# Patient Record
Sex: Male | Born: 1940
Health system: Southern US, Community
[De-identification: ages and names within clinical notes are randomized; demographics above are authoritative.]

## PROBLEM LIST (undated history)

## (undated) DIAGNOSIS — E8881 Metabolic syndrome: Secondary | ICD-10-CM

## (undated) DIAGNOSIS — R399 Unspecified symptoms and signs involving the genitourinary system: Secondary | ICD-10-CM

## (undated) DIAGNOSIS — M8580 Other specified disorders of bone density and structure, unspecified site: Secondary | ICD-10-CM

## (undated) DIAGNOSIS — H3530 Unspecified macular degeneration: Secondary | ICD-10-CM

## (undated) DIAGNOSIS — I7121 Aneurysm of the ascending aorta, without rupture: Secondary | ICD-10-CM

## (undated) DIAGNOSIS — I499 Cardiac arrhythmia, unspecified: Secondary | ICD-10-CM

## (undated) DIAGNOSIS — E785 Hyperlipidemia, unspecified: Secondary | ICD-10-CM

## (undated) DIAGNOSIS — I714 Abdominal aortic aneurysm, without rupture, unspecified: Secondary | ICD-10-CM

## (undated) DIAGNOSIS — I712 Thoracic aortic aneurysm, without rupture: Secondary | ICD-10-CM

## (undated) DIAGNOSIS — I219 Acute myocardial infarction, unspecified: Secondary | ICD-10-CM

## (undated) DIAGNOSIS — I739 Peripheral vascular disease, unspecified: Secondary | ICD-10-CM

## (undated) DIAGNOSIS — N183 Chronic kidney disease, stage 3 unspecified: Secondary | ICD-10-CM

## (undated) DIAGNOSIS — R3912 Poor urinary stream: Secondary | ICD-10-CM

## (undated) DIAGNOSIS — Q631 Lobulated, fused and horseshoe kidney: Secondary | ICD-10-CM

## (undated) DIAGNOSIS — C801 Malignant (primary) neoplasm, unspecified: Secondary | ICD-10-CM

## (undated) DIAGNOSIS — J439 Emphysema, unspecified: Secondary | ICD-10-CM

## (undated) DIAGNOSIS — I4821 Permanent atrial fibrillation: Secondary | ICD-10-CM

## (undated) DIAGNOSIS — E119 Type 2 diabetes mellitus without complications: Secondary | ICD-10-CM

## (undated) DIAGNOSIS — H43813 Vitreous degeneration, bilateral: Secondary | ICD-10-CM

## (undated) DIAGNOSIS — R7303 Prediabetes: Secondary | ICD-10-CM

## (undated) DIAGNOSIS — I10 Essential (primary) hypertension: Secondary | ICD-10-CM

## (undated) DIAGNOSIS — Z9289 Personal history of other medical treatment: Secondary | ICD-10-CM

## (undated) DIAGNOSIS — Z7901 Long term (current) use of anticoagulants: Secondary | ICD-10-CM

## (undated) DIAGNOSIS — E039 Hypothyroidism, unspecified: Secondary | ICD-10-CM

## (undated) DIAGNOSIS — E782 Mixed hyperlipidemia: Secondary | ICD-10-CM

## (undated) DIAGNOSIS — G629 Polyneuropathy, unspecified: Secondary | ICD-10-CM

## (undated) DIAGNOSIS — T8859XA Other complications of anesthesia, initial encounter: Secondary | ICD-10-CM

## (undated) DIAGNOSIS — I2510 Atherosclerotic heart disease of native coronary artery without angina pectoris: Secondary | ICD-10-CM

## (undated) HISTORY — DX: Metabolic syndrome: E88.810

## (undated) HISTORY — DX: Acute myocardial infarction, unspecified: I21.9

## (undated) HISTORY — DX: Aneurysm of the ascending aorta, without rupture: I71.21

## (undated) HISTORY — DX: Hypothyroidism, unspecified: E03.9

## (undated) HISTORY — DX: Thoracic aortic aneurysm, without rupture: I71.2

## (undated) HISTORY — DX: Abdominal aortic aneurysm, without rupture: I71.4

## (undated) HISTORY — DX: Hyperlipidemia, unspecified: E78.5

## (undated) HISTORY — DX: Peripheral vascular disease, unspecified: I73.9

## (undated) HISTORY — DX: Type 2 diabetes mellitus without complications: E11.9

## (undated) HISTORY — PX: COLONOSCOPY: SHX174

## (undated) HISTORY — DX: Chronic kidney disease, stage 3 (moderate): N18.3

## (undated) HISTORY — DX: Polyneuropathy, unspecified: G62.9

## (undated) HISTORY — DX: Abdominal aortic aneurysm, without rupture, unspecified: I71.40

## (undated) HISTORY — DX: Unspecified macular degeneration: H35.30

## (undated) HISTORY — DX: Metabolic syndrome: E88.81

## (undated) HISTORY — PX: CORONARY STENT PLACEMENT: SHX1402

## (undated) HISTORY — DX: Chronic kidney disease, stage 3 unspecified: N18.30

## (undated) HISTORY — DX: Personal history of other medical treatment: Z92.89

---

## 1998-08-30 DIAGNOSIS — I2510 Atherosclerotic heart disease of native coronary artery without angina pectoris: Secondary | ICD-10-CM

## 1998-08-30 DIAGNOSIS — Z955 Presence of coronary angioplasty implant and graft: Secondary | ICD-10-CM

## 1998-08-30 DIAGNOSIS — I252 Old myocardial infarction: Secondary | ICD-10-CM

## 1998-08-30 HISTORY — DX: Old myocardial infarction: I25.2

## 1998-08-30 HISTORY — DX: Presence of coronary angioplasty implant and graft: Z95.5

## 1998-08-30 HISTORY — DX: Atherosclerotic heart disease of native coronary artery without angina pectoris: I25.10

## 1998-08-30 HISTORY — PX: CARDIAC CATHETERIZATION: SHX172

## 1998-08-30 HISTORY — PX: CORONARY ANGIOPLASTY WITH STENT PLACEMENT: SHX49

## 2005-05-07 ENCOUNTER — Other Ambulatory Visit: Payer: Self-pay

## 2005-05-07 ENCOUNTER — Emergency Department: Payer: Self-pay | Admitting: General Practice

## 2008-11-23 ENCOUNTER — Inpatient Hospital Stay: Payer: Self-pay | Admitting: Internal Medicine

## 2008-12-12 ENCOUNTER — Encounter: Payer: Self-pay | Admitting: Gastroenterology

## 2009-01-23 ENCOUNTER — Ambulatory Visit: Payer: Self-pay | Admitting: Gastroenterology

## 2009-01-23 ENCOUNTER — Encounter: Payer: Self-pay | Admitting: Gastroenterology

## 2009-02-18 ENCOUNTER — Telehealth (INDEPENDENT_AMBULATORY_CARE_PROVIDER_SITE_OTHER): Payer: Self-pay | Admitting: *Deleted

## 2009-02-18 ENCOUNTER — Encounter: Payer: Self-pay | Admitting: Gastroenterology

## 2009-02-18 DIAGNOSIS — R933 Abnormal findings on diagnostic imaging of other parts of digestive tract: Secondary | ICD-10-CM | POA: Insufficient documentation

## 2009-03-05 ENCOUNTER — Telehealth (INDEPENDENT_AMBULATORY_CARE_PROVIDER_SITE_OTHER): Payer: Self-pay | Admitting: *Deleted

## 2009-03-06 ENCOUNTER — Ambulatory Visit: Payer: Self-pay | Admitting: Gastroenterology

## 2009-03-06 ENCOUNTER — Encounter: Payer: Self-pay | Admitting: Gastroenterology

## 2009-03-06 ENCOUNTER — Ambulatory Visit (HOSPITAL_COMMUNITY): Admission: RE | Admit: 2009-03-06 | Discharge: 2009-03-06 | Payer: Self-pay | Admitting: Gastroenterology

## 2010-01-17 ENCOUNTER — Emergency Department: Payer: Self-pay | Admitting: Unknown Physician Specialty

## 2010-02-09 ENCOUNTER — Encounter: Payer: Self-pay | Admitting: Gastroenterology

## 2010-02-18 ENCOUNTER — Encounter (INDEPENDENT_AMBULATORY_CARE_PROVIDER_SITE_OTHER): Payer: Self-pay | Admitting: *Deleted

## 2010-02-18 ENCOUNTER — Telehealth (INDEPENDENT_AMBULATORY_CARE_PROVIDER_SITE_OTHER): Payer: Self-pay | Admitting: *Deleted

## 2010-03-05 ENCOUNTER — Ambulatory Visit (HOSPITAL_COMMUNITY): Admission: RE | Admit: 2010-03-05 | Discharge: 2010-03-05 | Payer: Self-pay | Admitting: Gastroenterology

## 2010-03-05 ENCOUNTER — Ambulatory Visit: Payer: Self-pay | Admitting: Gastroenterology

## 2010-09-29 NOTE — Procedures (Signed)
Summary: Endoscopic Ultrasound  Patient: Thomas Fuentes Note: All result statuses are Final unless otherwise noted.  Tests: (1) Endoscopic Ultrasound (EUS)  EUS Endoscopic Ultrasound                             DONE     [facility]     [address] [place], [place]  [address]           ENDOSCOPIC ULTRASOUND PROCEDURE REPORT           PATIENT:  Thomas Fuentes, Thomas Fuentes  MR#:  [id number]     BIRTHDATE:  Oct 06, 1940  GENDER:  male     ENDOSCOPIST:  Rachael Fee, MD     PROCEDURE DATE:  03/05/2010     PROCEDURE:  Upper EUS     ASA CLASS:  Class II     INDICATIONS:  surveillance of 6.89mm duodenal subepithelial lesion     noted incidentally by Dr. 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 The patient was then placed in the left, lateral,     decubitus postion and IV sedation was administered. Throughout the     procedure, the patient's blood pressure, pulse and oxygen     saturations were monitored continuously.  Under direct     visualization, the  endoscope was introduced through the  and     advanced to the .  Water was used as necessary to provide an     acoustic interface.  Upon completion of the imaging, water was     removed and the patient was sent to the recovery room in     satisfactory condition.     <<PROCEDUREIMAGES>>           Endoscopic findings (limited views with radial echoendoscope):     1. Normal esophagus     2. Normal stomach     3. Small, round (<1cm) subepithelial bulge in duodenal bulb.           EUS findings:     1. The subepithelial lesion described above corresponded with a     hypoechoic, round, well demarcated lesion that communicates with     muscularis propria layer of duodenal bulb wall.  The lesion     measured 7.52mm maximally.     2. Normal gallbladder.     3. Limited views of liver, spleen, pancreas were all normal.           Impression:     The duodenal bulb subepithelial lesion has not signficantly grown,     changed in the past 12 months. It is causing no symptoms and I     think it is reasonable to simply follow him clinically at this     point without dedicated surveillance endoscopy.           ______________________________     Rachael Fee, MD           cc: Barnetta Chapel, MD           n.     Rosalie Doctor:   Reuel Boom  Marye Round at 03/05/2010 11:25 AM           Dupre, Peyton Najjar, [id number]  Note: An exclamation mark (!) indicates a result that was not dispersed into the flowsheet. Document Creation Date: 03/05/2010 11:25 AM _______________________________________________________________________  (1) Order result status: Final Collection or observation date-time: 03/05/2010 11:12 Requested date-time:  Receipt date-time:  Reported date-time:  Referring Physician:   Ordering Physician: Rob Bunting [id number]) Specimen Source:  Source: Launa Grill Order Number: [id number] Lab site:   Appended Document: Endoscopic Ultrasound patty, can you forward this to Dr. Barnetta Chapel, I am having trouble with [employer] system at hospital today.  Appended Document: Endoscopic Ultrasound faxed to [facility]

## 2010-09-29 NOTE — Letter (Signed)
Summary: EGD Instructions  [facility]  [address] [place], [place] [address]   Phone: [phone]  Fax: [phone]       [name]    09/05/1940    MRN: [id number]       Procedure Day /Date:03/05/10 Thomas Fuentes     Arrival Time:10 am       Procedure Time:11 am     Location of Procedure:                     X [facility] ( Outpatient Registration)    PREPARATION FOR ENDOSCOPY   On 03/05/10  THE DAY OF THE PROCEDURE:  1.   No solid foods, milk or milk products are allowed after midnight the night before your procedure.  2.   Do not drink anything colored red or purple.  Avoid juices with pulp.  No orange juice.  3.  You may drink clear liquids until 7 am, which is 4 hours before your procedure.                                                                                                CLEAR LIQUIDS INCLUDE: Water Jello Ice Popsicles Tea (sugar ok, no milk/cream) Powdered fruit flavored drinks Coffee (sugar ok, no milk/cream) Gatorade Juice: apple, white grape, white cranberry  Lemonade Clear bullion, consomm, broth Carbonated beverages (any kind) Strained chicken noodle soup Hard Candy   MEDICATION INSTRUCTIONS  Unless otherwise instructed, you should take regular prescription medications with a small sip of water as early as possible the morning of your procedure.               OTHER INSTRUCTIONS  You will need a responsible adult at least 72 years of age to accompany you and drive you home.   This person must remain in the waiting room during your procedure.  Wear loose fitting clothing that is easily removed.  Leave jewelry and other valuables at home.  However, you may wish to bring a book to read or an iPod/MP3 player to listen to music as you wait for your procedure to start.  Remove all body piercing jewelry and leave at home.  Total time from sign-in until discharge is approximately 2-3 hours.  You should go home  directly after your procedure and rest.  You can resume normal activities the day after your procedure.  The day of your procedure you should not:   Drive   Make legal decisions   Operate machinery   Drink alcohol   Return to work  You will receive specific instructions about eating, activities and medications before you leave.    The above instructions have been reviewed and explained to me by   Chales Abrahams CMA Duncan Dull)  February 18, 2010 3:44 PM     I fully understand and can verbalize these instructions over the phone and mailed to home Date 02/18/10

## 2010-09-29 NOTE — Procedures (Signed)
Summary: Recall Assessment/[facility]  Recall Assessment/[facility]   Imported By: Sherian Rein 02/20/2010 09:46:01  _____________________________________________________________________  External Attachment:    Type:   Image     Comment:   External Document

## 2010-09-29 NOTE — Progress Notes (Signed)
Summary: EUS  Phone Note Outgoing Call Call back at [facility] Phone [phone]   Call placed by: Chales Abrahams CMA Duncan Dull),  February 18, 2010 10:25 AM Summary of Call: pt needs to be scheduled for EUS  radial linear for Duodenal lesion f/u  left message on machine to call back  Initial call taken by: Chales Abrahams CMA Duncan Dull),  February 18, 2010 10:26 AM  Follow-up for Phone Call        pt returned call and is available any Thursday, his meds were reviewed.  No diabetes and no blood thinners.  I will call him back when it is scheduled Follow-up by: Chales Abrahams CMA Duncan Dull),  February 18, 2010 3:36 PM  Additional Follow-up for Phone Call Additional follow up Details #1::        pt scheduled and instructed Additional Follow-up by: Chales Abrahams CMA Duncan Dull),  February 18, 2010 3:53 PM

## 2011-09-07 DIAGNOSIS — Z87891 Personal history of nicotine dependence: Secondary | ICD-10-CM | POA: Diagnosis not present

## 2011-09-16 DIAGNOSIS — D485 Neoplasm of uncertain behavior of skin: Secondary | ICD-10-CM | POA: Diagnosis not present

## 2011-09-16 DIAGNOSIS — C4441 Basal cell carcinoma of skin of scalp and neck: Secondary | ICD-10-CM | POA: Diagnosis not present

## 2011-09-16 DIAGNOSIS — L570 Actinic keratosis: Secondary | ICD-10-CM | POA: Diagnosis not present

## 2011-11-10 DIAGNOSIS — C4441 Basal cell carcinoma of skin of scalp and neck: Secondary | ICD-10-CM | POA: Diagnosis not present

## 2011-11-30 DIAGNOSIS — E785 Hyperlipidemia, unspecified: Secondary | ICD-10-CM | POA: Diagnosis not present

## 2012-02-04 DIAGNOSIS — G609 Hereditary and idiopathic neuropathy, unspecified: Secondary | ICD-10-CM | POA: Diagnosis not present

## 2012-02-04 DIAGNOSIS — E8881 Metabolic syndrome: Secondary | ICD-10-CM | POA: Diagnosis not present

## 2012-02-04 DIAGNOSIS — R7309 Other abnormal glucose: Secondary | ICD-10-CM | POA: Diagnosis not present

## 2012-02-04 DIAGNOSIS — I259 Chronic ischemic heart disease, unspecified: Secondary | ICD-10-CM | POA: Diagnosis not present

## 2012-02-04 DIAGNOSIS — I10 Essential (primary) hypertension: Secondary | ICD-10-CM | POA: Diagnosis not present

## 2012-02-04 DIAGNOSIS — E785 Hyperlipidemia, unspecified: Secondary | ICD-10-CM | POA: Diagnosis not present

## 2012-02-04 DIAGNOSIS — I252 Old myocardial infarction: Secondary | ICD-10-CM | POA: Diagnosis not present

## 2012-02-21 DIAGNOSIS — R509 Fever, unspecified: Secondary | ICD-10-CM | POA: Diagnosis not present

## 2012-02-21 DIAGNOSIS — R82998 Other abnormal findings in urine: Secondary | ICD-10-CM | POA: Diagnosis not present

## 2012-02-21 DIAGNOSIS — N3000 Acute cystitis without hematuria: Secondary | ICD-10-CM | POA: Diagnosis not present

## 2012-02-21 DIAGNOSIS — I10 Essential (primary) hypertension: Secondary | ICD-10-CM | POA: Diagnosis not present

## 2012-02-21 DIAGNOSIS — R300 Dysuria: Secondary | ICD-10-CM | POA: Diagnosis not present

## 2012-02-25 DIAGNOSIS — L819 Disorder of pigmentation, unspecified: Secondary | ICD-10-CM | POA: Diagnosis not present

## 2012-02-25 DIAGNOSIS — D1801 Hemangioma of skin and subcutaneous tissue: Secondary | ICD-10-CM | POA: Diagnosis not present

## 2012-02-25 DIAGNOSIS — D239 Other benign neoplasm of skin, unspecified: Secondary | ICD-10-CM | POA: Diagnosis not present

## 2012-02-25 DIAGNOSIS — L821 Other seborrheic keratosis: Secondary | ICD-10-CM | POA: Diagnosis not present

## 2012-02-25 DIAGNOSIS — L570 Actinic keratosis: Secondary | ICD-10-CM | POA: Diagnosis not present

## 2012-02-25 DIAGNOSIS — Z85828 Personal history of other malignant neoplasm of skin: Secondary | ICD-10-CM | POA: Diagnosis not present

## 2012-03-13 DIAGNOSIS — E039 Hypothyroidism, unspecified: Secondary | ICD-10-CM | POA: Diagnosis not present

## 2012-03-13 DIAGNOSIS — R300 Dysuria: Secondary | ICD-10-CM | POA: Diagnosis not present

## 2012-03-13 DIAGNOSIS — E785 Hyperlipidemia, unspecified: Secondary | ICD-10-CM | POA: Diagnosis not present

## 2012-06-08 DIAGNOSIS — R7309 Other abnormal glucose: Secondary | ICD-10-CM | POA: Diagnosis not present

## 2012-06-08 DIAGNOSIS — E785 Hyperlipidemia, unspecified: Secondary | ICD-10-CM | POA: Diagnosis not present

## 2012-07-24 DIAGNOSIS — K219 Gastro-esophageal reflux disease without esophagitis: Secondary | ICD-10-CM | POA: Diagnosis not present

## 2012-08-03 DIAGNOSIS — I252 Old myocardial infarction: Secondary | ICD-10-CM | POA: Diagnosis not present

## 2012-08-03 DIAGNOSIS — I259 Chronic ischemic heart disease, unspecified: Secondary | ICD-10-CM | POA: Diagnosis not present

## 2012-08-03 DIAGNOSIS — Z125 Encounter for screening for malignant neoplasm of prostate: Secondary | ICD-10-CM | POA: Diagnosis not present

## 2012-08-03 DIAGNOSIS — E785 Hyperlipidemia, unspecified: Secondary | ICD-10-CM | POA: Diagnosis not present

## 2012-08-03 DIAGNOSIS — I10 Essential (primary) hypertension: Secondary | ICD-10-CM | POA: Diagnosis not present

## 2012-08-03 DIAGNOSIS — R7309 Other abnormal glucose: Secondary | ICD-10-CM | POA: Diagnosis not present

## 2012-08-03 DIAGNOSIS — E039 Hypothyroidism, unspecified: Secondary | ICD-10-CM | POA: Diagnosis not present

## 2012-08-03 DIAGNOSIS — I714 Abdominal aortic aneurysm, without rupture: Secondary | ICD-10-CM | POA: Diagnosis not present

## 2012-08-10 DIAGNOSIS — Z23 Encounter for immunization: Secondary | ICD-10-CM | POA: Diagnosis not present

## 2012-08-10 DIAGNOSIS — E785 Hyperlipidemia, unspecified: Secondary | ICD-10-CM | POA: Diagnosis not present

## 2012-08-10 DIAGNOSIS — R7309 Other abnormal glucose: Secondary | ICD-10-CM | POA: Diagnosis not present

## 2012-08-10 DIAGNOSIS — Z125 Encounter for screening for malignant neoplasm of prostate: Secondary | ICD-10-CM | POA: Diagnosis not present

## 2012-08-10 DIAGNOSIS — I10 Essential (primary) hypertension: Secondary | ICD-10-CM | POA: Diagnosis not present

## 2012-08-10 DIAGNOSIS — Z0000 Encounter for general adult medical examination without abnormal findings: Secondary | ICD-10-CM | POA: Diagnosis not present

## 2012-08-11 DIAGNOSIS — Z1212 Encounter for screening for malignant neoplasm of rectum: Secondary | ICD-10-CM | POA: Diagnosis not present

## 2012-09-05 DIAGNOSIS — K921 Melena: Secondary | ICD-10-CM | POA: Diagnosis not present

## 2012-10-31 DIAGNOSIS — I739 Peripheral vascular disease, unspecified: Secondary | ICD-10-CM | POA: Diagnosis not present

## 2012-10-31 DIAGNOSIS — I714 Abdominal aortic aneurysm, without rupture: Secondary | ICD-10-CM | POA: Diagnosis not present

## 2013-02-07 DIAGNOSIS — H353290 Exudative age-related macular degeneration, unspecified eye, stage unspecified: Secondary | ICD-10-CM | POA: Diagnosis not present

## 2013-02-07 DIAGNOSIS — H353190 Nonexudative age-related macular degeneration, unspecified eye, stage unspecified: Secondary | ICD-10-CM | POA: Diagnosis not present

## 2013-02-12 DIAGNOSIS — E785 Hyperlipidemia, unspecified: Secondary | ICD-10-CM | POA: Diagnosis not present

## 2013-02-12 DIAGNOSIS — R7309 Other abnormal glucose: Secondary | ICD-10-CM | POA: Diagnosis not present

## 2013-02-12 DIAGNOSIS — I714 Abdominal aortic aneurysm, without rupture: Secondary | ICD-10-CM | POA: Diagnosis not present

## 2013-02-12 DIAGNOSIS — I259 Chronic ischemic heart disease, unspecified: Secondary | ICD-10-CM | POA: Diagnosis not present

## 2013-02-12 DIAGNOSIS — I252 Old myocardial infarction: Secondary | ICD-10-CM | POA: Diagnosis not present

## 2013-02-12 DIAGNOSIS — Z1331 Encounter for screening for depression: Secondary | ICD-10-CM | POA: Diagnosis not present

## 2013-02-12 DIAGNOSIS — G609 Hereditary and idiopathic neuropathy, unspecified: Secondary | ICD-10-CM | POA: Diagnosis not present

## 2013-02-12 DIAGNOSIS — I10 Essential (primary) hypertension: Secondary | ICD-10-CM | POA: Diagnosis not present

## 2013-03-01 DIAGNOSIS — H353290 Exudative age-related macular degeneration, unspecified eye, stage unspecified: Secondary | ICD-10-CM | POA: Diagnosis not present

## 2013-03-06 DIAGNOSIS — D239 Other benign neoplasm of skin, unspecified: Secondary | ICD-10-CM | POA: Diagnosis not present

## 2013-03-06 DIAGNOSIS — Z85828 Personal history of other malignant neoplasm of skin: Secondary | ICD-10-CM | POA: Diagnosis not present

## 2013-03-06 DIAGNOSIS — D1801 Hemangioma of skin and subcutaneous tissue: Secondary | ICD-10-CM | POA: Diagnosis not present

## 2013-03-06 DIAGNOSIS — L819 Disorder of pigmentation, unspecified: Secondary | ICD-10-CM | POA: Diagnosis not present

## 2013-03-06 DIAGNOSIS — L570 Actinic keratosis: Secondary | ICD-10-CM | POA: Diagnosis not present

## 2013-03-06 DIAGNOSIS — L821 Other seborrheic keratosis: Secondary | ICD-10-CM | POA: Diagnosis not present

## 2013-03-30 DIAGNOSIS — H353290 Exudative age-related macular degeneration, unspecified eye, stage unspecified: Secondary | ICD-10-CM | POA: Diagnosis not present

## 2013-05-04 DIAGNOSIS — H353290 Exudative age-related macular degeneration, unspecified eye, stage unspecified: Secondary | ICD-10-CM | POA: Diagnosis not present

## 2013-05-30 ENCOUNTER — Emergency Department: Payer: Self-pay | Admitting: Emergency Medicine

## 2013-05-30 DIAGNOSIS — Z95818 Presence of other cardiac implants and grafts: Secondary | ICD-10-CM | POA: Diagnosis not present

## 2013-05-30 DIAGNOSIS — Z7982 Long term (current) use of aspirin: Secondary | ICD-10-CM | POA: Diagnosis not present

## 2013-05-30 DIAGNOSIS — R5381 Other malaise: Secondary | ICD-10-CM | POA: Diagnosis not present

## 2013-05-30 DIAGNOSIS — T6391XA Toxic effect of contact with unspecified venomous animal, accidental (unintentional), initial encounter: Secondary | ICD-10-CM | POA: Diagnosis not present

## 2013-05-30 DIAGNOSIS — S0100XA Unspecified open wound of scalp, initial encounter: Secondary | ICD-10-CM | POA: Diagnosis not present

## 2013-05-30 DIAGNOSIS — R0602 Shortness of breath: Secondary | ICD-10-CM | POA: Diagnosis not present

## 2013-05-30 DIAGNOSIS — S0990XA Unspecified injury of head, initial encounter: Secondary | ICD-10-CM | POA: Diagnosis not present

## 2013-05-30 DIAGNOSIS — R42 Dizziness and giddiness: Secondary | ICD-10-CM | POA: Diagnosis not present

## 2013-05-30 DIAGNOSIS — I252 Old myocardial infarction: Secondary | ICD-10-CM | POA: Diagnosis not present

## 2013-05-30 DIAGNOSIS — E785 Hyperlipidemia, unspecified: Secondary | ICD-10-CM | POA: Diagnosis not present

## 2013-05-30 DIAGNOSIS — Z91038 Other insect allergy status: Secondary | ICD-10-CM | POA: Diagnosis not present

## 2013-05-30 DIAGNOSIS — I10 Essential (primary) hypertension: Secondary | ICD-10-CM | POA: Diagnosis not present

## 2013-06-07 ENCOUNTER — Emergency Department: Payer: Self-pay | Admitting: Internal Medicine

## 2013-06-08 DIAGNOSIS — H353290 Exudative age-related macular degeneration, unspecified eye, stage unspecified: Secondary | ICD-10-CM | POA: Diagnosis not present

## 2013-07-15 ENCOUNTER — Other Ambulatory Visit: Payer: Self-pay

## 2013-07-15 ENCOUNTER — Encounter (HOSPITAL_COMMUNITY): Payer: Self-pay | Admitting: Emergency Medicine

## 2013-07-15 ENCOUNTER — Emergency Department (HOSPITAL_COMMUNITY)
Admission: EM | Admit: 2013-07-15 | Discharge: 2013-07-15 | Disposition: A | Discharge status: Home / Self Care | Payer: Medicare Other | Attending: Emergency Medicine | Admitting: Emergency Medicine

## 2013-07-15 ENCOUNTER — Emergency Department (INDEPENDENT_AMBULATORY_CARE_PROVIDER_SITE_OTHER)
Admission: EM | Admit: 2013-07-15 | Discharge: 2013-07-15 | Disposition: A | Discharge status: Other Acute Inpatient Hospital | Payer: Medicare Other | Source: Home / Self Care | Attending: Family Medicine | Admitting: Family Medicine

## 2013-07-15 ENCOUNTER — Emergency Department (HOSPITAL_COMMUNITY): Payer: Medicare Other

## 2013-07-15 DIAGNOSIS — R55 Syncope and collapse: Secondary | ICD-10-CM

## 2013-07-15 DIAGNOSIS — J111 Influenza due to unidentified influenza virus with other respiratory manifestations: Secondary | ICD-10-CM | POA: Insufficient documentation

## 2013-07-15 DIAGNOSIS — R42 Dizziness and giddiness: Secondary | ICD-10-CM | POA: Insufficient documentation

## 2013-07-15 DIAGNOSIS — Z87891 Personal history of nicotine dependence: Secondary | ICD-10-CM | POA: Insufficient documentation

## 2013-07-15 DIAGNOSIS — I2510 Atherosclerotic heart disease of native coronary artery without angina pectoris: Secondary | ICD-10-CM | POA: Diagnosis not present

## 2013-07-15 DIAGNOSIS — Z9861 Coronary angioplasty status: Secondary | ICD-10-CM | POA: Insufficient documentation

## 2013-07-15 DIAGNOSIS — J3489 Other specified disorders of nose and nasal sinuses: Secondary | ICD-10-CM | POA: Insufficient documentation

## 2013-07-15 DIAGNOSIS — I10 Essential (primary) hypertension: Secondary | ICD-10-CM | POA: Insufficient documentation

## 2013-07-15 DIAGNOSIS — R630 Anorexia: Secondary | ICD-10-CM | POA: Diagnosis not present

## 2013-07-15 DIAGNOSIS — R05 Cough: Secondary | ICD-10-CM | POA: Diagnosis not present

## 2013-07-15 HISTORY — DX: Atherosclerotic heart disease of native coronary artery without angina pectoris: I25.10

## 2013-07-15 HISTORY — DX: Essential (primary) hypertension: I10

## 2013-07-15 LAB — URINALYSIS, ROUTINE W REFLEX MICROSCOPIC
Bilirubin Urine: NEGATIVE
Glucose, UA: NEGATIVE mg/dL
Hgb urine dipstick: NEGATIVE
Ketones, ur: NEGATIVE mg/dL
Protein, ur: NEGATIVE mg/dL

## 2013-07-15 LAB — CBC WITH DIFFERENTIAL/PLATELET
Basophils Absolute: 0 10*3/uL (ref 0.0–0.1)
Basophils Relative: 0 % (ref 0–1)
Hemoglobin: 12.8 g/dL — ABNORMAL LOW (ref 13.0–17.0)
Lymphocytes Relative: 7 % — ABNORMAL LOW (ref 12–46)
MCHC: 33.3 g/dL (ref 30.0–36.0)
Monocytes Relative: 7 % (ref 3–12)
Neutro Abs: 14.3 10*3/uL — ABNORMAL HIGH (ref 1.7–7.7)
Neutrophils Relative %: 87 % — ABNORMAL HIGH (ref 43–77)
WBC: 16.5 10*3/uL — ABNORMAL HIGH (ref 4.0–10.5)

## 2013-07-15 LAB — COMPREHENSIVE METABOLIC PANEL
AST: 18 U/L (ref 0–37)
Albumin: 3.6 g/dL (ref 3.5–5.2)
Alkaline Phosphatase: 36 U/L — ABNORMAL LOW (ref 39–117)
BUN: 34 mg/dL — ABNORMAL HIGH (ref 6–23)
CO2: 23 mEq/L (ref 19–32)
Chloride: 103 mEq/L (ref 96–112)
Potassium: 4.6 mEq/L (ref 3.5–5.1)
Total Bilirubin: 0.4 mg/dL (ref 0.3–1.2)

## 2013-07-15 LAB — GLUCOSE, CAPILLARY: Glucose-Capillary: 97 mg/dL (ref 70–99)

## 2013-07-15 MED ORDER — OSELTAMIVIR PHOSPHATE 75 MG PO CAPS: 75.0000 mg | ORAL_CAPSULE | Freq: Two times a day (BID) | ORAL | Status: DC

## 2013-07-15 MED ORDER — SODIUM CHLORIDE 0.9 % IV SOLN
INTRAVENOUS | Status: DC
  Administered 2013-07-15: 15:00:00 via INTRAVENOUS

## 2013-07-15 MED ORDER — IBUPROFEN 400 MG PO TABS
400.0000 mg | ORAL_TABLET | Freq: Once | ORAL | Status: AC
  Administered 2013-07-15: 400 mg via ORAL
  Filled 2013-07-15: qty 1

## 2013-07-15 MED ORDER — ACETAMINOPHEN 325 MG PO TABS
650.0000 mg | ORAL_TABLET | Freq: Once | ORAL | Status: AC
Start: 2013-07-15 — End: 2013-07-15
  Administered 2013-07-15: 650 mg via ORAL
  Filled 2013-07-15: qty 2

## 2013-07-15 NOTE — ED Notes (Addendum)
EKG from [facility] given to Dr. Anitra Lauth

## 2013-07-15 NOTE — ED Notes (Signed)
Assisted patient in dressing in blue scrubs for trip home.

## 2013-07-15 NOTE — ED Notes (Addendum)
Went to Urgent care for weakness, fever, chills x 1 day. While there, he passed out while going to bathroom with diarrhea episode. Currently A&Ox4. Reports chills now; no other distress at this time.

## 2013-07-15 NOTE — ED Notes (Signed)
Patient transported to X-ray 

## 2013-07-15 NOTE — ED Notes (Signed)
Able to walk to restroom and back to room without dizziness. Gait steady. States he feels 100% better and is ready to go home.

## 2013-07-15 NOTE — ED Notes (Signed)
Patient asked for urine sample. 

## 2013-07-15 NOTE — ED Notes (Signed)
Meal tray ordered 

## 2013-07-15 NOTE — ED Provider Notes (Signed)
CSN: [id number]     Arrival date & time 07/15/13  1239 History   First MD Initiated Contact with Patient 07/15/13 1318     No chief complaint on file.  (Consider location/radiation/quality/duration/timing/severity/associated sxs/prior Treatment) Patient is a 72 y.o. male presenting with fever. The history is provided by the patient.  Fever Temp source:  Subjective Severity:  Moderate Onset quality:  Sudden Duration:  1 day Chronicity:  New Associated symptoms: chills   Associated symptoms comment:  Sudden syncope during transport to exam room., no pain, no n/v.   No past medical history on file. No past surgical history on file. No family history on file. History  Substance Use Topics  . Smoking status: Not on file  . Smokeless tobacco: Not on file  . Alcohol Use: Not on file    Review of Systems  Constitutional: Positive for fever and chills.  Respiratory: Negative.   Gastrointestinal: Negative.   Genitourinary: Negative.     Allergies  Review of patient's allergies indicates not on file.  Home Medications  No current outpatient prescriptions on file. There were no vitals taken for this visit. Physical Exam  Nursing note and vitals reviewed. Constitutional: He is oriented to person, place, and time. He appears well-developed and well-nourished.  Cardiovascular: Regular rhythm and intact distal pulses.   Pulmonary/Chest: Effort normal and breath sounds normal.  Abdominal: Soft. Bowel sounds are normal.  Musculoskeletal: Normal range of motion.  Neurological: He is alert and oriented to person, place, and time. No cranial nerve deficit.  Skin: Skin is warm and dry.    ED Course  Procedures (including critical care time) Labs Review Labs Reviewed - No data to display Imaging Review No results found.  EKG Interpretation     Ventricular Rate:    PR Interval:    QRS Duration:   QT Interval:    QTC Calculation:   R Axis:     Text Interpretation:               MDM  Sent b/o syncope with bowel incontinence, h/o cad with 2 stents. Onset of fever last eve.    Linna Hoff, MD 07/15/13 [phone]

## 2013-07-15 NOTE — ED Notes (Signed)
Patient returned from X-ray 

## 2013-07-15 NOTE — ED Provider Notes (Signed)
CSN: [id number]     Arrival date & time 07/15/13  1350 History   First MD Initiated Contact with Patient 07/15/13 1359     Chief Complaint  Patient presents with  . Loss of Consciousness   (Consider location/radiation/quality/duration/timing/severity/associated sxs/prior Treatment) Patient is a 72 y.o. male presenting with syncope and flu symptoms. The history is provided by the patient.  Loss of Consciousness Episode history:  Single Most recent episode:  Today Duration:  30 seconds Timing:  Constant Progression:  Resolved Chronicity:  New Context: standing up   Context comment:  Has been ill and then was wearing a mask at urgent care which made him feel like he was suffocating and then he stood up and felt dizzy and passed out Witnessed: yes   Relieved by:  Lying down Worsened by:  Nothing tried Ineffective treatments:  None tried Associated symptoms: dizziness and fever   Associated symptoms: no confusion, no difficulty breathing, no palpitations, no shortness of breath and no vomiting   Influenza Presenting symptoms: fever   Presenting symptoms: no shortness of breath and no vomiting   Severity:  Severe Onset quality:  Sudden Duration:  1 day Progression:  Worsening Chronicity:  New Relieved by:  OTC medications Worsened by:  Nothing tried Associated symptoms: chills, decreased appetite, nasal congestion and syncope   Associated symptoms: no ear pain and no neck stiffness   Risk factors: being elderly and heart disease   Risk factors: no diabetes problem, no immunocompromised state, no kidney disease and no liver disease   Risk factors comment:  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  had a flu shot this year   Past Medical History  Diagnosis Date  . Coronary artery disease   . Hypertension    Past Surgical History  Procedure Laterality Date  . Coronary stent placement     History reviewed. No pertinent family history. History  Substance Use  Topics  . Smoking status: Former Smoker    Types: Cigarettes    Quit date: 02/06/1999  . Smokeless tobacco: Not on file  . Alcohol Use: Yes     Comment: occasional    Review of Systems  Constitutional: Positive for fever, chills and decreased appetite.  HENT: Positive for congestion. Negative for ear pain.   Respiratory: Negative for shortness of breath.   Cardiovascular: Positive for syncope. Negative for palpitations.  Gastrointestinal: Negative for vomiting.  Musculoskeletal: Negative for neck stiffness.  Neurological: Positive for dizziness.  Psychiatric/Behavioral: Negative for confusion.  All other systems reviewed and are negative.    Allergies  Lidocaine  Home Medications  No current outpatient prescriptions on file. BP 115/66  Temp(Src) 102.6 F (39.2 C) (Oral)  Resp 24  Ht 5\' 8"  (1.727 m)  Wt 150 lb (68.04 kg)  BMI 22.81 kg/m2  SpO2 98% Physical Exam  Nursing note and vitals reviewed. Constitutional: He is oriented to person, place, and time. He appears well-developed and well-nourished. No distress.  HENT:  Head: Normocephalic and atraumatic.  Mouth/Throat: Oropharynx is clear and moist.  Eyes: Conjunctivae and EOM are normal. Pupils are equal, round, and reactive to light.  Neck: Normal range of motion. Neck supple.  Cardiovascular: Normal rate, regular rhythm and intact distal pulses.   No murmur heard. Pulmonary/Chest: Effort normal and breath sounds normal. No respiratory distress. He has no wheezes. He has no rales.  Abdominal: Soft. He exhibits no distension. There is no tenderness. There is no rebound and no guarding.  Musculoskeletal: Normal range of motion. He exhibits no edema and no tenderness.  Neurological: He is alert and oriented to person, place, and time.  Skin: Skin is warm and dry. No rash noted. No erythema.  Psychiatric: He has a normal mood and affect. His behavior is normal.    ED Course  Procedures (including critical care  time) Labs Review Labs Reviewed  CBC WITH DIFFERENTIAL - Abnormal; Notable for the following:    WBC 16.5 (*)    RBC 4.15 (*)    Hemoglobin 12.8 (*)    HCT 38.4 (*)    Neutrophils Relative % 87 (*)    Neutro Abs 14.3 (*)    Lymphocytes Relative 7 (*)    Monocytes Absolute 1.1 (*)    All other components within normal limits  COMPREHENSIVE METABOLIC PANEL - Abnormal; Notable for the following:    BUN 34 (*)    Creatinine, Ser 1.76 (*)    Alkaline Phosphatase 36 (*)    GFR calc non Af Amer 36 (*)    GFR calc Af Amer 42 (*)    All other components within normal limits  URINALYSIS, ROUTINE W REFLEX MICROSCOPIC - Abnormal; Notable for the following:    APPearance CLOUDY (*)    All other components within normal limits   Imaging Review Dg Chest 2 View  07/15/2013   CLINICAL DATA:  Syncope and cough, history of coronary artery disease  EXAM: CHEST  2 VIEW  COMPARISON:  None.  FINDINGS: The heart size and mediastinal contours are within normal limits. Both lungs are clear. The visualized skeletal structures are unremarkable. Lungs are hypoaerated with crowding of the bronchovascular markings.  IMPRESSION: No active cardiopulmonary disease.   Electronically Signed   By: Christiana Pellant M.D.   On: 07/15/2013 15:02    EKG Interpretation   None      Date: 07/15/2013  Rate: 86  Rhythm: normal sinus rhythm  QRS Axis: normal  Intervals: normal  ST/T Wave abnormalities: normal  Conduction Disutrbances: none  Narrative Interpretation: unremarkable      MDM   1. Influenza     Pt with symptoms consistent with influenza.  Normal exam here but is febrile.  No signs of breathing difficulty  No signs of strep pharyngitis, otitis or abnormal abdominal findings.  Pt was at urgent care and pt states since they made him wear a mask he started to get very hot and dizzy and felt that he was going to pass out and he stood up and then syncopized onto the floor.  No injury at this time and now  feels better.  BP now improved to 120. CXR, CBC, CMP, UA pending.  Will continue antipyretica and rest and fluids and return for any further problems.  5:53 PM Labs with leukocytosis but o/w wnl.  Pt's bp is improving and temp has resolved and pt feels much better.  He states his pressure is usually in the low 100's and 90's.  Will ambulate pt and make sure he feels ok.  7:13 PM Pt has had pressures in the 80's and 90's but he feels great.  Tolerating po's.  He is able to ambulate without difficulty and denies dizziness.  Will have pt f/u with PCP tomorrow or return for worsening sx.  No signs of sepsis at this time.  Pt given tamiflu.  Gwyneth Sprout, MD 07/15/13 [phone]

## 2013-07-15 NOTE — ED Notes (Signed)
Called service response to check on meal tray; they report it is on the way.

## 2013-07-15 NOTE — ED Notes (Signed)
Sitting on side of bed eating meal. Tolerating meal very well, all food almost gone. Reports feeling his normal self.

## 2013-07-18 DIAGNOSIS — N179 Acute kidney failure, unspecified: Secondary | ICD-10-CM | POA: Diagnosis not present

## 2013-07-18 DIAGNOSIS — B9789 Other viral agents as the cause of diseases classified elsewhere: Secondary | ICD-10-CM | POA: Diagnosis not present

## 2013-07-18 DIAGNOSIS — IMO0002 Reserved for concepts with insufficient information to code with codable children: Secondary | ICD-10-CM | POA: Diagnosis not present

## 2013-07-18 DIAGNOSIS — A088 Other specified intestinal infections: Secondary | ICD-10-CM | POA: Diagnosis not present

## 2013-07-31 DIAGNOSIS — H353290 Exudative age-related macular degeneration, unspecified eye, stage unspecified: Secondary | ICD-10-CM | POA: Diagnosis not present

## 2013-08-08 DIAGNOSIS — R7309 Other abnormal glucose: Secondary | ICD-10-CM | POA: Diagnosis not present

## 2013-08-08 DIAGNOSIS — Z125 Encounter for screening for malignant neoplasm of prostate: Secondary | ICD-10-CM | POA: Diagnosis not present

## 2013-08-08 DIAGNOSIS — E039 Hypothyroidism, unspecified: Secondary | ICD-10-CM | POA: Diagnosis not present

## 2013-08-08 DIAGNOSIS — E785 Hyperlipidemia, unspecified: Secondary | ICD-10-CM | POA: Diagnosis not present

## 2013-08-09 DIAGNOSIS — K219 Gastro-esophageal reflux disease without esophagitis: Secondary | ICD-10-CM | POA: Diagnosis not present

## 2013-08-15 ENCOUNTER — Other Ambulatory Visit: Payer: Self-pay | Admitting: Internal Medicine

## 2013-08-15 DIAGNOSIS — Z23 Encounter for immunization: Secondary | ICD-10-CM | POA: Diagnosis not present

## 2013-08-15 DIAGNOSIS — I259 Chronic ischemic heart disease, unspecified: Secondary | ICD-10-CM | POA: Diagnosis not present

## 2013-08-15 DIAGNOSIS — I10 Essential (primary) hypertension: Secondary | ICD-10-CM | POA: Diagnosis not present

## 2013-08-15 DIAGNOSIS — R7309 Other abnormal glucose: Secondary | ICD-10-CM | POA: Diagnosis not present

## 2013-08-15 DIAGNOSIS — Z0000 Encounter for general adult medical examination without abnormal findings: Secondary | ICD-10-CM | POA: Diagnosis not present

## 2013-08-15 DIAGNOSIS — I714 Abdominal aortic aneurysm, without rupture: Secondary | ICD-10-CM

## 2013-08-15 DIAGNOSIS — E039 Hypothyroidism, unspecified: Secondary | ICD-10-CM | POA: Diagnosis not present

## 2013-08-15 DIAGNOSIS — E8881 Metabolic syndrome: Secondary | ICD-10-CM | POA: Diagnosis not present

## 2013-08-15 DIAGNOSIS — E785 Hyperlipidemia, unspecified: Secondary | ICD-10-CM | POA: Diagnosis not present

## 2013-08-16 DIAGNOSIS — Z1212 Encounter for screening for malignant neoplasm of rectum: Secondary | ICD-10-CM | POA: Diagnosis not present

## 2013-09-03 ENCOUNTER — Ambulatory Visit
Admission: RE | Admit: 2013-09-03 | Discharge: 2013-09-03 | Disposition: A | Discharge status: Home / Self Care | Payer: Medicare Other | Source: Ambulatory Visit | Attending: Internal Medicine | Admitting: Internal Medicine

## 2013-09-03 DIAGNOSIS — I714 Abdominal aortic aneurysm, without rupture, unspecified: Secondary | ICD-10-CM

## 2013-09-04 DIAGNOSIS — H353290 Exudative age-related macular degeneration, unspecified eye, stage unspecified: Secondary | ICD-10-CM | POA: Diagnosis not present

## 2013-10-02 DIAGNOSIS — H353290 Exudative age-related macular degeneration, unspecified eye, stage unspecified: Secondary | ICD-10-CM | POA: Diagnosis not present

## 2013-10-02 DIAGNOSIS — H353190 Nonexudative age-related macular degeneration, unspecified eye, stage unspecified: Secondary | ICD-10-CM | POA: Diagnosis not present

## 2013-10-30 DIAGNOSIS — H353290 Exudative age-related macular degeneration, unspecified eye, stage unspecified: Secondary | ICD-10-CM | POA: Diagnosis not present

## 2013-10-30 DIAGNOSIS — H353190 Nonexudative age-related macular degeneration, unspecified eye, stage unspecified: Secondary | ICD-10-CM | POA: Diagnosis not present

## 2013-11-22 IMAGING — CR DG CHEST 2V
2 series · 2 of 2 positions shown · non-contrast
Comparison: None.

CLINICAL DATA: Syncope and cough, history of coronary artery
disease

EXAM:
CHEST  2 VIEW

[w chest pa]
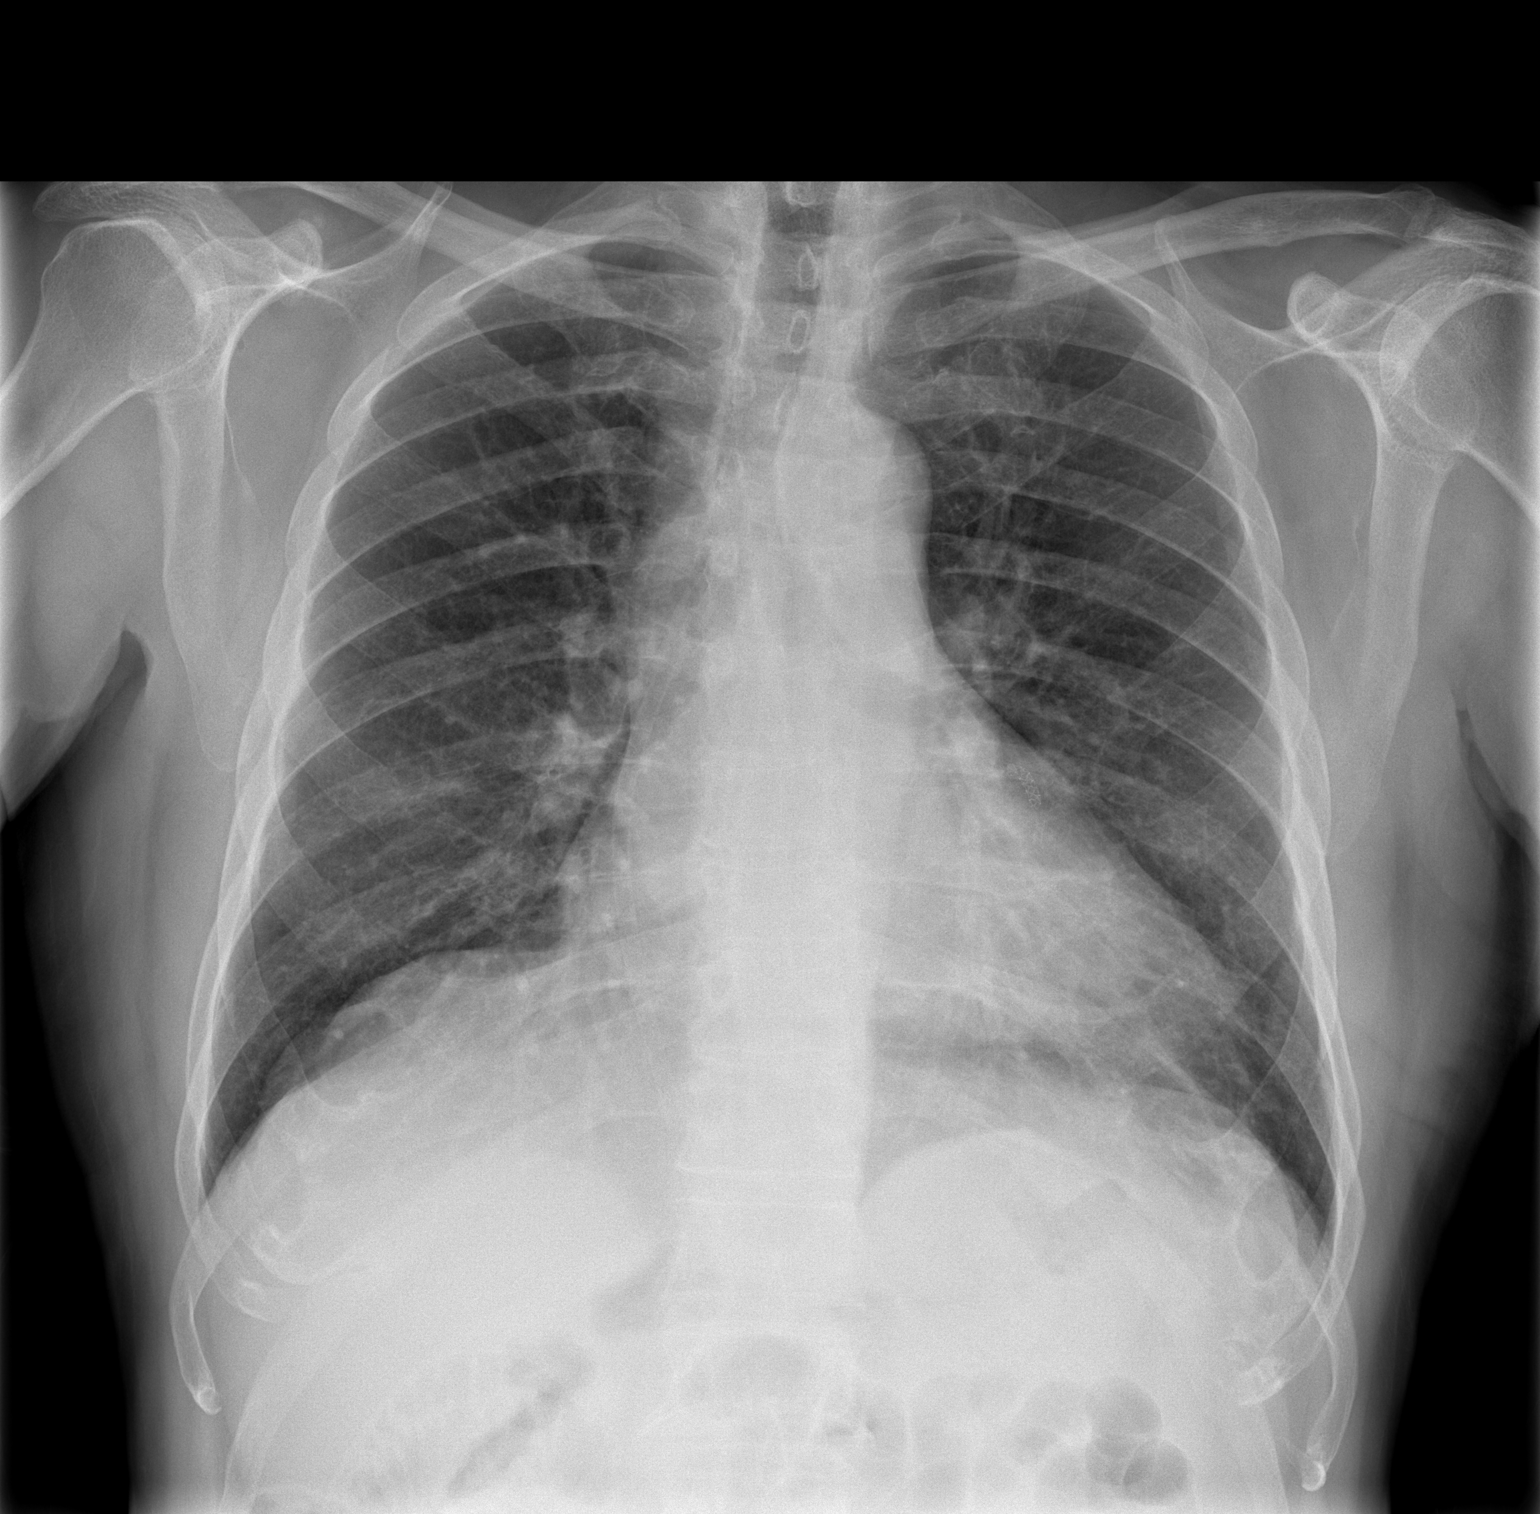

[w chest lat]
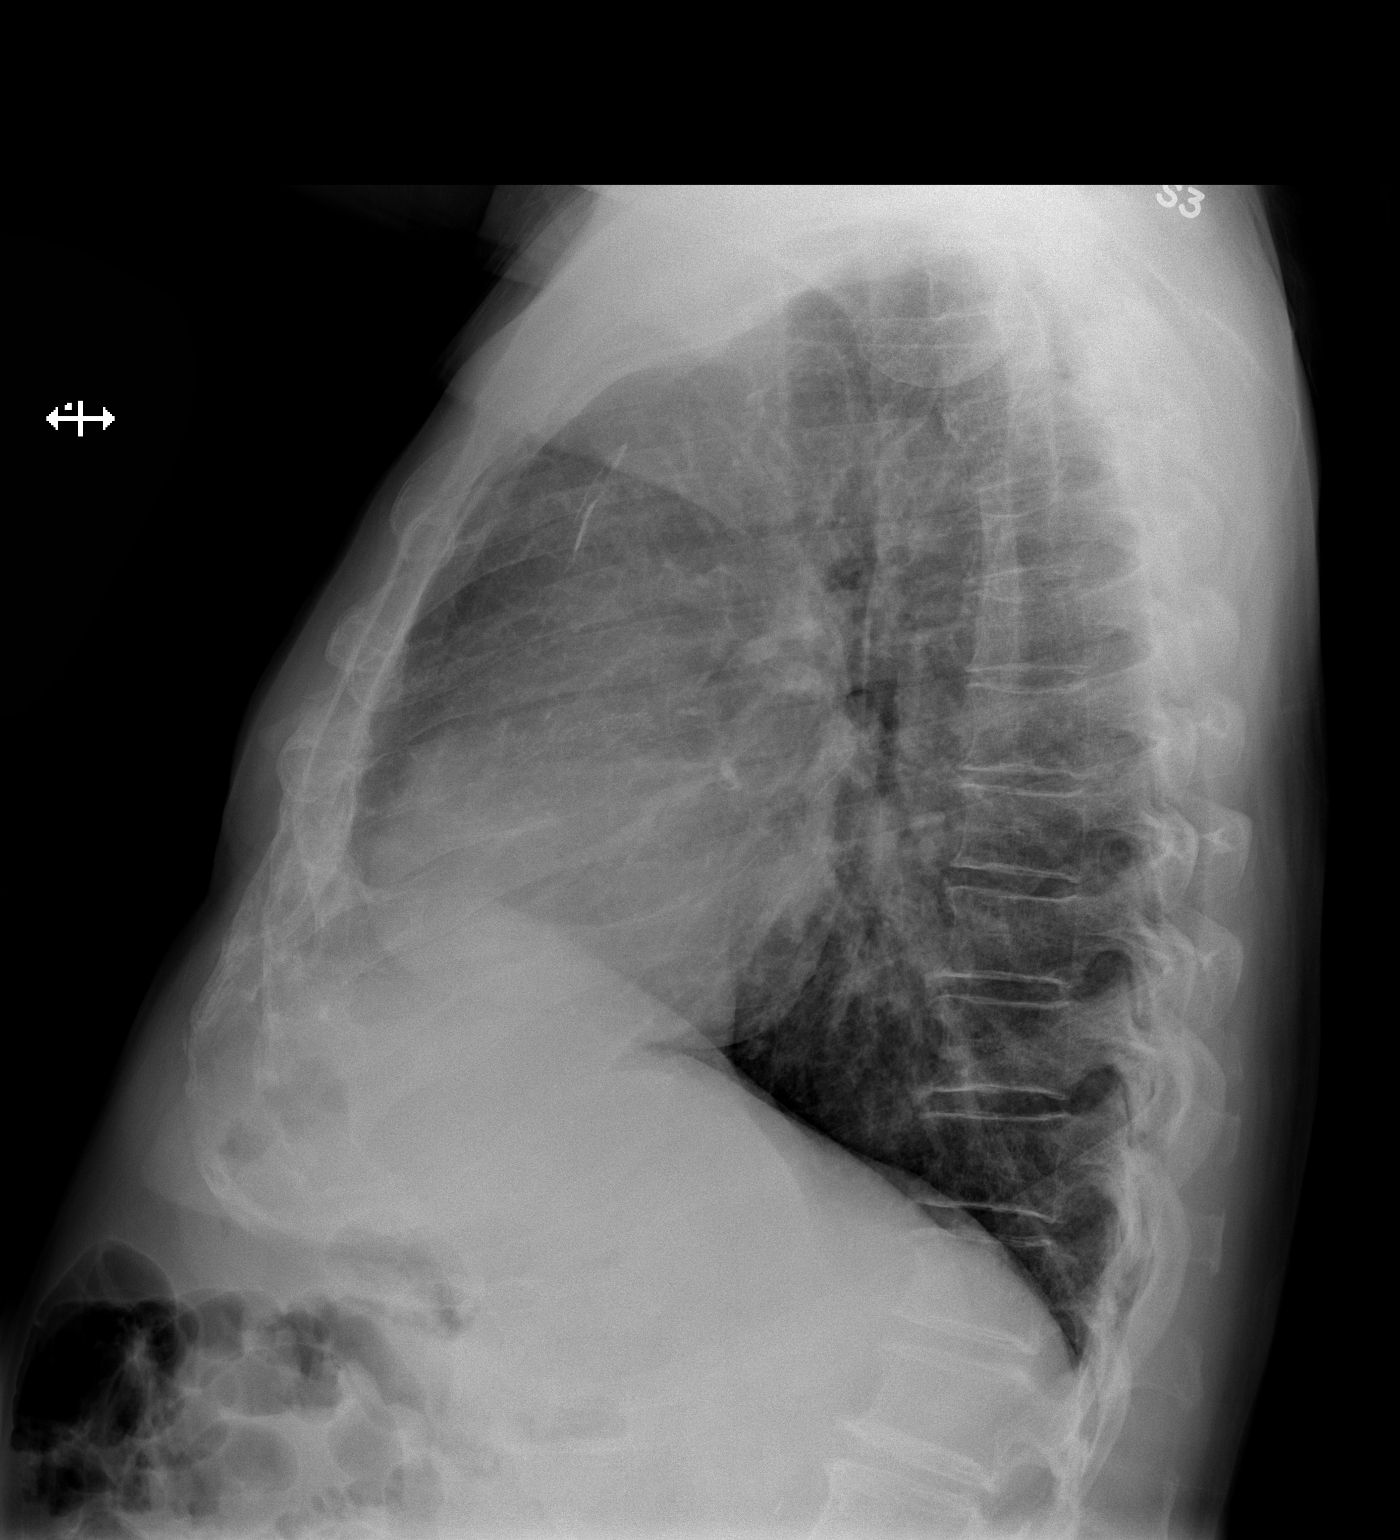

[2 of 2 positions shown; findings below may reference images not displayed]

FINDINGS: The heart size and mediastinal contours are within normal limits.
Both lungs are clear. The visualized skeletal structures are
unremarkable. Lungs are hypoaerated with crowding of the
bronchovascular markings.
IMPRESSION: No active cardiopulmonary disease.

## 2013-11-26 DIAGNOSIS — H353290 Exudative age-related macular degeneration, unspecified eye, stage unspecified: Secondary | ICD-10-CM | POA: Diagnosis not present

## 2014-01-07 DIAGNOSIS — H353190 Nonexudative age-related macular degeneration, unspecified eye, stage unspecified: Secondary | ICD-10-CM | POA: Diagnosis not present

## 2014-01-07 DIAGNOSIS — H353290 Exudative age-related macular degeneration, unspecified eye, stage unspecified: Secondary | ICD-10-CM | POA: Diagnosis not present

## 2014-01-11 IMAGING — US US AORTA
1 series · 14 of 21 positions shown · non-contrast
Comparison: None.

CLINICAL DATA: Abdominal aortic aneurysm

EXAM:
ULTRASOUND OF ABDOMINAL AORTA
TECHNIQUE: Ultrasound examination of the abdominal aorta was performed to
evaluate for abdominal aortic aneurysm.

[Series 1: us aorta · 0.33mm/px · 14 of 21 slices shown]
[im 1/21]
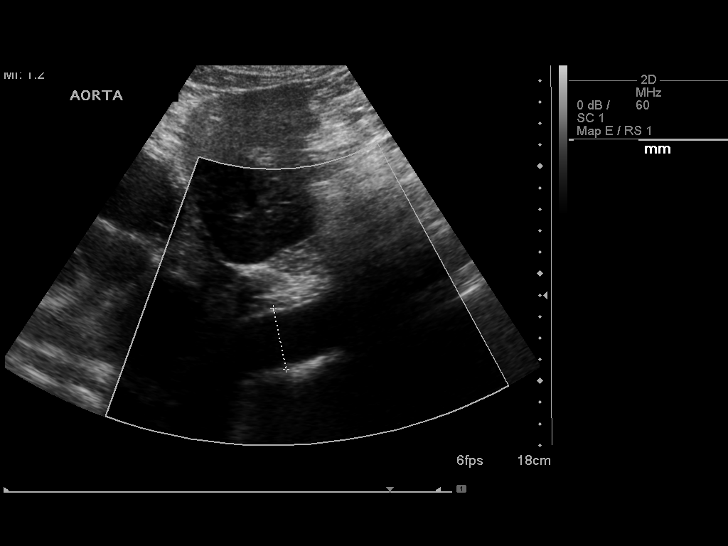
[im 3/21]
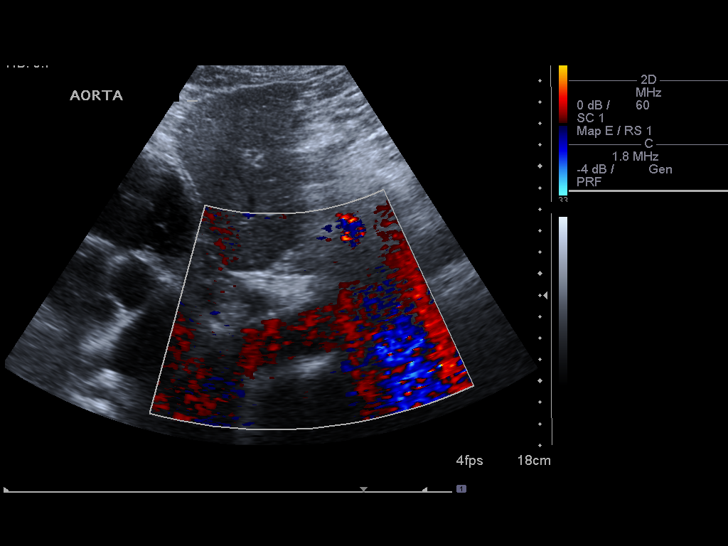
[im 4/21]
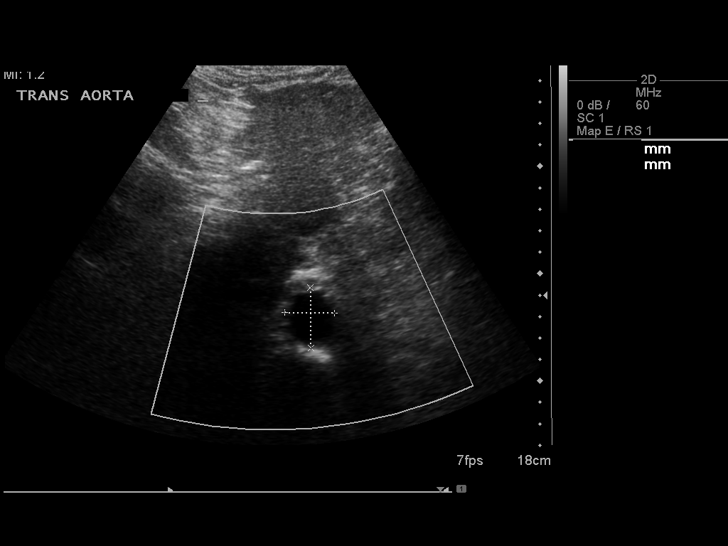
[im 6/21]
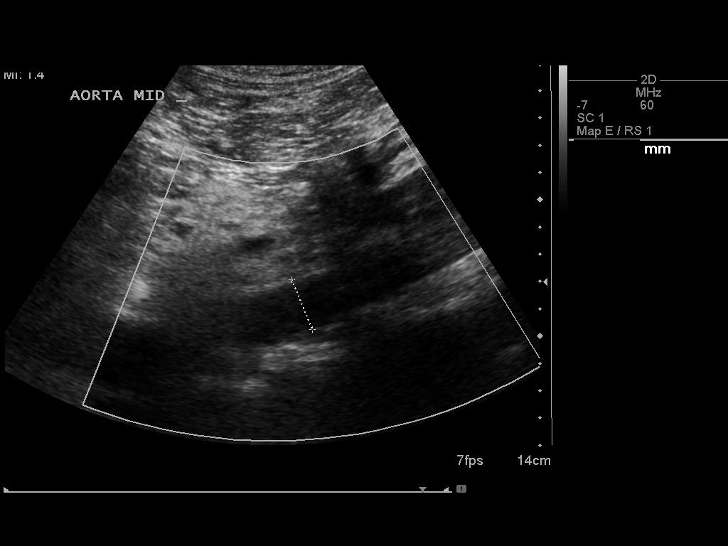
[im 7/21]
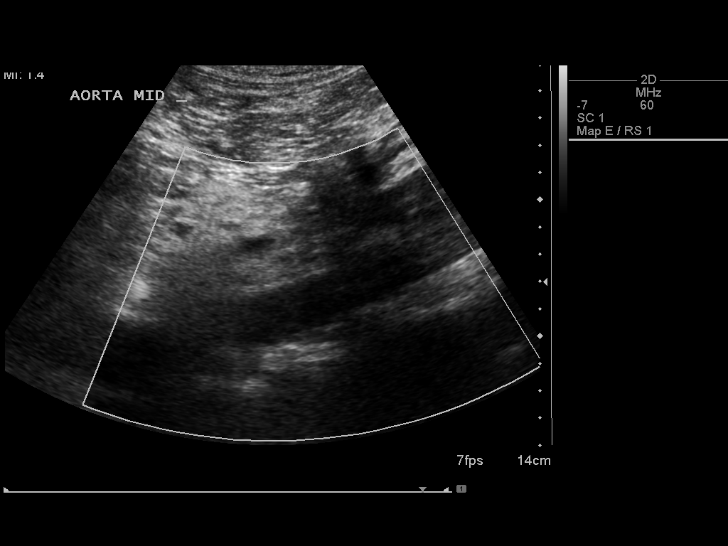
[im 9/21]
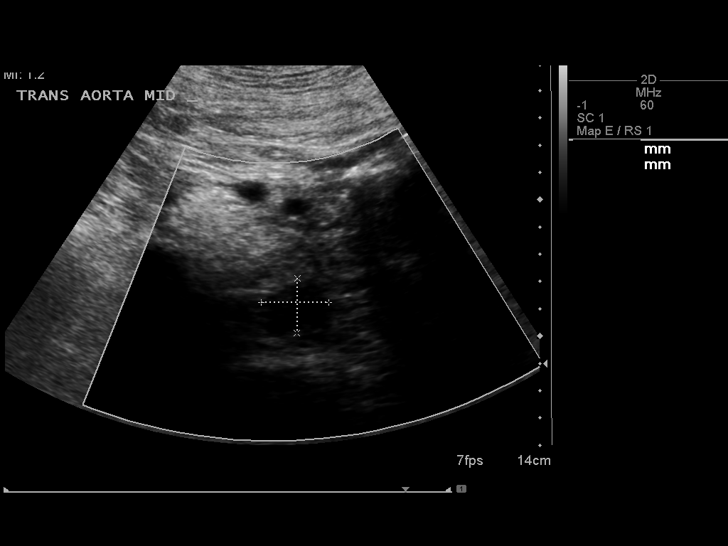
[im 10/21]
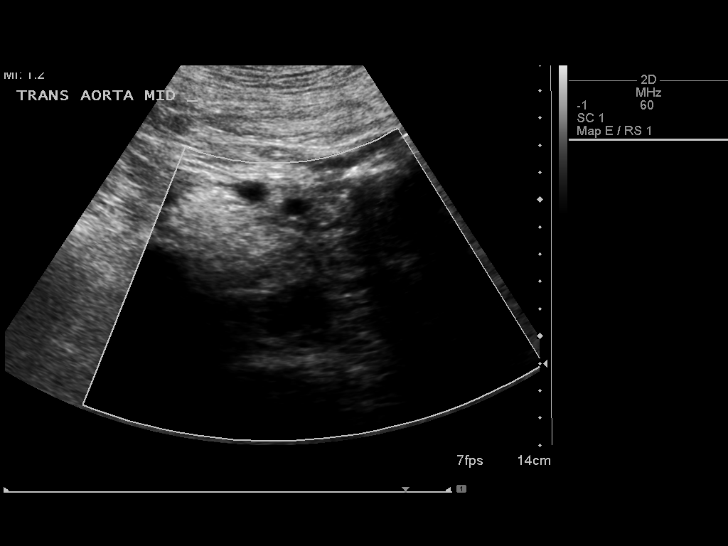
[im 12/21]
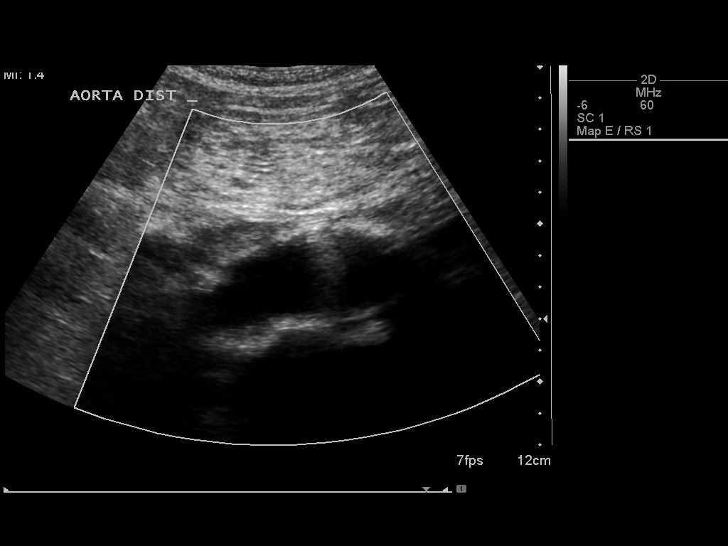
[im 13/21]
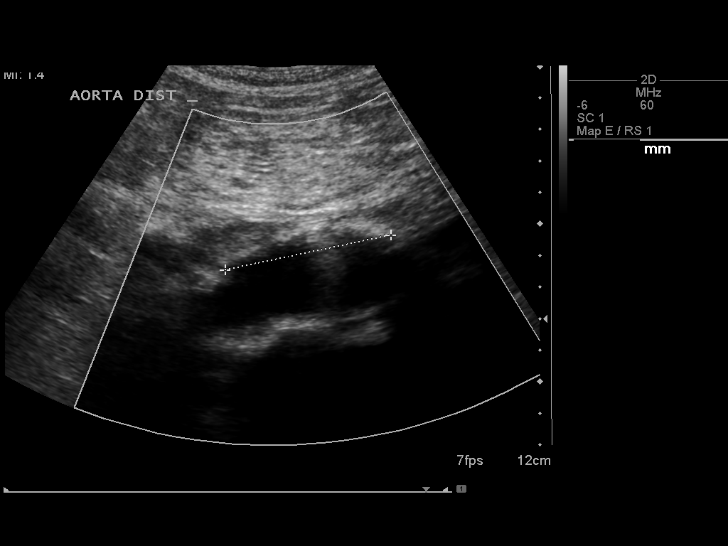
[im 15/21]
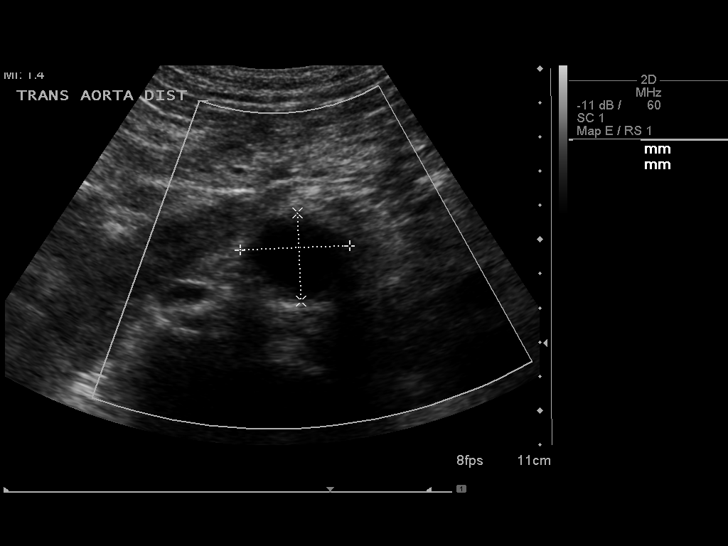
[im 16/21]
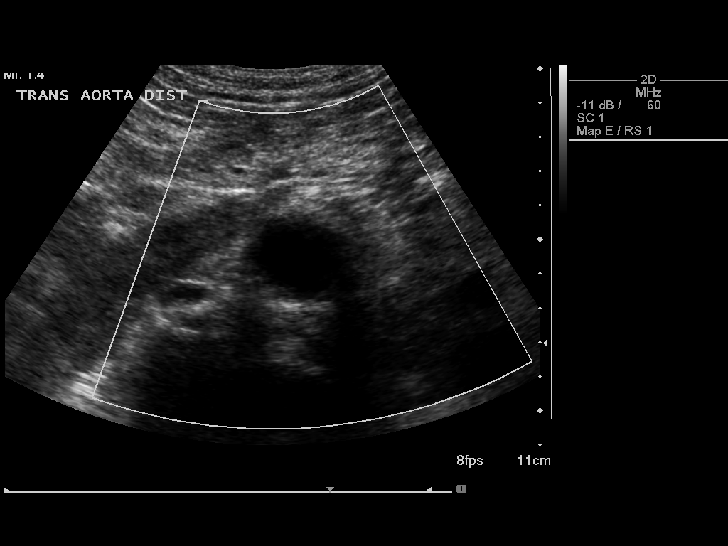
[im 18/21]
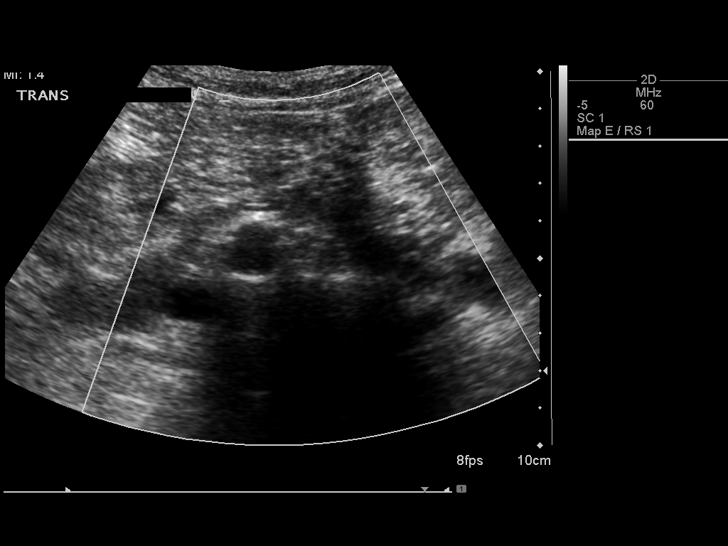
[im 19/21]
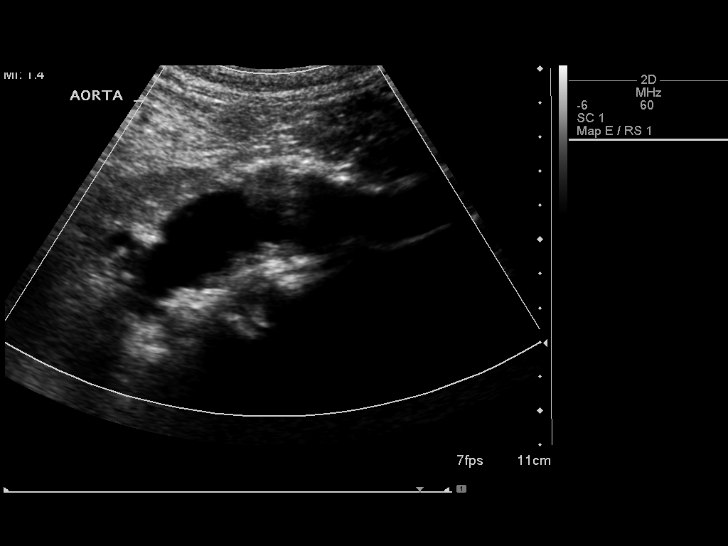
[im 21/21]
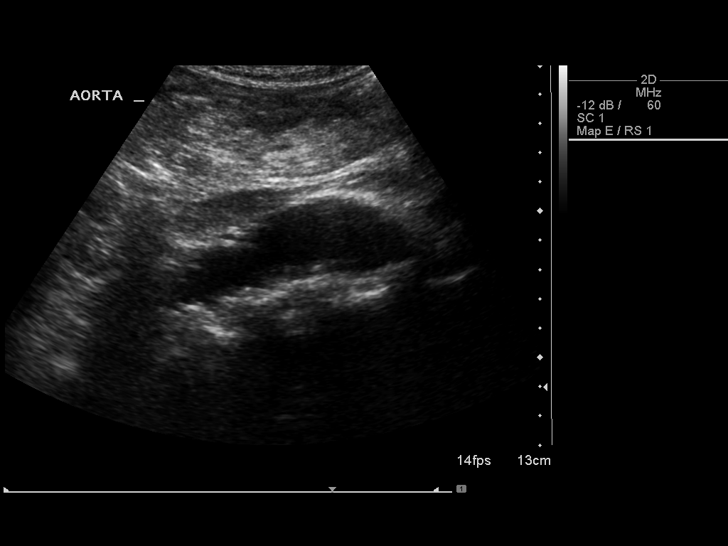

[14 of 21 positions shown; findings below may reference images not displayed]

FINDINGS: Abdominal Aorta

Fusiform dilatation of the mid and distal aorta is identified. The
aneurysm measures about 5.4 cm in craniocaudal length.

Maximum AP

Diameter:  3.2 cm

Maximum TRV

Diameter: 3.2 cm
IMPRESSION: Fusiform aneurysmal dilatation of the abdominal aorta up to 3.2 cm
maximum diameter.

## 2014-01-18 ENCOUNTER — Emergency Department: Payer: Self-pay | Admitting: Emergency Medicine

## 2014-01-18 DIAGNOSIS — R079 Chest pain, unspecified: Secondary | ICD-10-CM | POA: Diagnosis not present

## 2014-01-18 DIAGNOSIS — Z9861 Coronary angioplasty status: Secondary | ICD-10-CM | POA: Diagnosis not present

## 2014-01-18 DIAGNOSIS — I2510 Atherosclerotic heart disease of native coronary artery without angina pectoris: Secondary | ICD-10-CM | POA: Diagnosis not present

## 2014-01-18 DIAGNOSIS — Z79899 Other long term (current) drug therapy: Secondary | ICD-10-CM | POA: Diagnosis not present

## 2014-01-18 DIAGNOSIS — J9819 Other pulmonary collapse: Secondary | ICD-10-CM | POA: Diagnosis not present

## 2014-01-18 LAB — CBC
HCT: 44.5 % (ref 40.0–52.0)
HGB: 14.8 g/dL (ref 13.0–18.0)
MCH: 29.8 pg (ref 26.0–34.0)
MCHC: 33.2 g/dL (ref 32.0–36.0)
MCV: 90 fL (ref 80–100)
Platelet: 194 10*3/uL (ref 150–440)
RBC: 4.96 10*6/uL (ref 4.40–5.90)
RDW: 13.7 % (ref 11.5–14.5)
WBC: 6.8 10*3/uL (ref 3.8–10.6)

## 2014-01-18 LAB — BASIC METABOLIC PANEL
ANION GAP: 5 — AB (ref 7–16)
BUN: 26 mg/dL — AB (ref 7–18)
CALCIUM: 9.6 mg/dL (ref 8.5–10.1)
CO2: 28 mmol/L (ref 21–32)
Chloride: 103 mmol/L (ref 98–107)
Creatinine: 1.54 mg/dL — ABNORMAL HIGH (ref 0.60–1.30)
EGFR (African American): 50 — ABNORMAL LOW
EGFR (Non-African Amer.): 43 — ABNORMAL LOW
Glucose: 103 mg/dL — ABNORMAL HIGH (ref 65–99)
Osmolality: 277 (ref 275–301)
POTASSIUM: 4.4 mmol/L (ref 3.5–5.1)
Sodium: 136 mmol/L (ref 136–145)

## 2014-01-18 LAB — LIPASE, BLOOD: LIPASE: 153 U/L (ref 73–393)

## 2014-01-18 LAB — TROPONIN I: Troponin-I: <0.02 ng/mL

## 2014-01-22 ENCOUNTER — Ambulatory Visit (INDEPENDENT_AMBULATORY_CARE_PROVIDER_SITE_OTHER): Payer: Medicare Other | Admitting: Cardiovascular Disease

## 2014-01-22 ENCOUNTER — Encounter: Payer: Self-pay | Admitting: *Deleted

## 2014-01-22 ENCOUNTER — Encounter: Payer: Self-pay | Admitting: Cardiovascular Disease

## 2014-01-22 VITALS — BP 122/80 | HR 63 | Ht 68.0 in | Wt 158.0 lb

## 2014-01-22 DIAGNOSIS — I2510 Atherosclerotic heart disease of native coronary artery without angina pectoris: Secondary | ICD-10-CM | POA: Diagnosis not present

## 2014-01-22 DIAGNOSIS — E785 Hyperlipidemia, unspecified: Secondary | ICD-10-CM | POA: Diagnosis not present

## 2014-01-22 DIAGNOSIS — R079 Chest pain, unspecified: Secondary | ICD-10-CM | POA: Diagnosis not present

## 2014-01-22 DIAGNOSIS — I10 Essential (primary) hypertension: Secondary | ICD-10-CM

## 2014-01-22 NOTE — Patient Instructions (Addendum)
[facility]  Your caregiver has ordered a Stress Test with nuclear imaging. The purpose of this test is to evaluate the blood supply to your heart muscle. This procedure is referred to as a "Non-Invasive Stress Test." This is because other than having an IV started in your vein, nothing is inserted or "invades" your body. Cardiac stress tests are done to find areas of poor blood flow to the heart by determining the extent of coronary artery disease (CAD). Some patients exercise on a treadmill, which naturally increases the blood flow to your heart, while others who are  unable to walk on a treadmill due to physical limitations have a pharmacologic/chemical stress agent called Lexiscan . This medicine will mimic walking on a treadmill by temporarily increasing your coronary blood flow.   Please note: these test may take anywhere between 2-4 hours to complete  PLEASE REPORT TO [facility] AT THE FIRST DESK WILL DIRECT YOU WHERE TO GO  Date of Procedure:___________05/28/15__________________________  Arrival Time for Procedure:________0945 am______________________   PLEASE NOTIFY THE OFFICE AT LEAST 24 HOURS IN ADVANCE IF YOU ARE UNABLE TO KEEP YOUR APPOINTMENT.  [phone] AND  PLEASE NOTIFY NUCLEAR MEDICINE AT [facility] AT LEAST 24 HOURS IN ADVANCE IF YOU ARE UNABLE TO KEEP YOUR APPOINTMENT. [phone]  How to prepare for your Myoview test:  1. Do not eat or drink after midnight 2. No caffeine for 24 hours prior to test 3. No smoking 24 hours prior to test. 4. Your medication may be taken with water.  If your doctor stopped a medication because of this test, do not take that medication. 5. Ladies, please do not wear dresses.  Skirts or pants are appropriate. Please wear a short sleeve shirt. 6. No perfume, cologne or lotion. 7. Wear comfortable walking shoes. No heels!         Your physician wants you to follow-up in: 6 months. You will receive a reminder  letter in the mail two months in advance. If you don't receive a letter, please call our office to schedule the follow-up appointment.

## 2014-01-24 ENCOUNTER — Ambulatory Visit: Payer: Self-pay | Admitting: Cardiovascular Disease

## 2014-01-24 ENCOUNTER — Encounter: Payer: Self-pay | Admitting: Cardiovascular Disease

## 2014-01-24 ENCOUNTER — Other Ambulatory Visit: Payer: Self-pay

## 2014-01-24 DIAGNOSIS — I2510 Atherosclerotic heart disease of native coronary artery without angina pectoris: Secondary | ICD-10-CM | POA: Diagnosis not present

## 2014-01-24 DIAGNOSIS — R079 Chest pain, unspecified: Secondary | ICD-10-CM

## 2014-01-24 DIAGNOSIS — E785 Hyperlipidemia, unspecified: Secondary | ICD-10-CM | POA: Insufficient documentation

## 2014-01-24 DIAGNOSIS — I10 Essential (primary) hypertension: Secondary | ICD-10-CM | POA: Insufficient documentation

## 2014-01-24 NOTE — Assessment & Plan Note (Signed)
The patient has known history of coronary artery disease with recent episode of chest discomfort which is atypical but according to him is similar to his chest pain when he had myocardial infarction. I recommend evaluation with a treadmill nuclear stress test. Otherwise continue aggressive medical therapy.

## 2014-01-24 NOTE — Assessment & Plan Note (Signed)
He is unfortunately in that arm to statins. Continue treatment with Zetia.

## 2014-01-24 NOTE — Progress Notes (Signed)
Primary care physician: Dr. Marton Redwood  HPI  This is a pleasant 73 year old man who was referred from the emergency room at [facility] for evaluation of chest pain. The patient has known history of coronary artery disease status post myocardial infarction in 2000. He had PCI in 2 stent placement by Dr. Clayborn Bigness. Operative report is not available. He has chronic medical conditions that include hypertension, hyperlipidemia (intolerant to statins), type 2 diabetes and chronic kidney disease. He started having chest pain on Thursday which was overall mild. He had an episode on Friday of substernal chest pain in the lower chest area described as aching which lasted for about 3 minutes. It happened at rest when he was driving. It's reminded him of his previous myocardial infarction and thus he went to the emergency room at [facility]. He was chest pain-free at that time. Cardiac enzymes were negative. ECG did not show acute changes. Blood pressure was elevated but subsequently improved. He reports no further episodes of chest pain since then but he has not exerted himself. He quit smoking in 2000. He does have family history of coronary artery disease but not prematurely.  Allergies  Allergen Reactions  . Lidocaine Anaphylaxis    novacaine and lidocaine     Current Outpatient Prescriptions on File Prior to Visit  Medication Sig Dispense Refill  . beta carotene w/minerals (OCUVITE) tablet Take 1 tablet by mouth daily.      . fenofibrate 160 MG tablet Take 160 mg by mouth daily.      Marland Kitchen gabapentin (NEURONTIN) 300 MG capsule Take 600 mg by mouth 3 (three) times daily.      . pantoprazole (PROTONIX) 40 MG tablet Take 40 mg by mouth daily.      . ramipril (ALTACE) 10 MG capsule Take 10 mg by mouth daily.       . vitamin E 100 UNIT capsule Take 100 Units by mouth daily.      Marland Kitchen ezetimibe (ZETIA) 10 MG tablet Take 10 mg by mouth daily.       No current facility-administered medications on file prior to visit.      Past Medical History  Diagnosis Date  . Diabetes mellitus without complication   . Dyslipidemia   . Metabolic syndrome   . MI (myocardial infarction)   . Peripheral neuropathy   . AAA (abdominal aortic aneurysm)   . Chronic renal disease, stage III   . Peripheral vascular disease   . Hypothyroidism   . Coronary artery disease     Myocardial infarction in 2000 status post PCI and 2 stent placement to unknown arteries  . Hyperlipidemia   . Hypertension      Past Surgical History  Procedure Laterality Date  . Coronary stent placement    . Cardiac catheterization  2000    [facility] x2 stents     Family History  Problem Relation Age of Onset  . Hypertension Mother      History   Social History  . Marital Status: Widowed    Spouse Name: N/A    Number of Children: N/A  . Years of Education: N/A   Occupational History  . Not on file.   Social History Main Topics  . Smoking status: Former Smoker -- 40 years    Types: Cigarettes    Quit date: 02/06/1999  . Smokeless tobacco: Not on file  . Alcohol Use: Yes     Comment: occasional  . Drug Use: No  . Sexual Activity: Not on file  Other Topics Concern  . Not on file   Social History Narrative  . No narrative on file     ROS A 10 point review of system was performed. It is negative other than that mentioned in the history of present illness.   PHYSICAL EXAM   BP 122/80  Pulse 63  Ht 5\' 8"  (1.727 m)  Wt 158 lb (71.668 kg)  BMI 24.03 kg/m2 Constitutional: He is oriented to person, place, and time. He appears well-developed and well-nourished. No distress.  HENT: No nasal discharge.  Head: Normocephalic and atraumatic.  Eyes: Pupils are equal and round.  No discharge. Neck: Normal range of motion. Neck supple. No JVD present. No thyromegaly present.  Cardiovascular: Normal rate, regular rhythm, normal heart sounds. Exam reveals no gallop and no friction rub. No murmur heard.  Pulmonary/Chest: Effort  normal and breath sounds normal. No stridor. No respiratory distress. He has no wheezes. He has no rales. He exhibits no tenderness.  Abdominal: Soft. Bowel sounds are normal. He exhibits no distension. There is no tenderness. There is no rebound and no guarding.  Musculoskeletal: Normal range of motion. He exhibits no edema and no tenderness.  Neurological: He is alert and oriented to person, place, and time. Coordination normal.  Skin: Skin is warm and dry. No rash noted. He is not diaphoretic. No erythema. No pallor.  Psychiatric: He has a normal mood and affect. His behavior is normal. Judgment and thought content normal.       EKG: Normal sinus rhythm with no significant ST or T wave changes.   ASSESSMENT AND PLAN

## 2014-01-24 NOTE — Assessment & Plan Note (Signed)
Blood pressure is well controlled on current medications. 

## 2014-02-08 DIAGNOSIS — L678 Other hair color and hair shaft abnormalities: Secondary | ICD-10-CM | POA: Diagnosis not present

## 2014-02-08 DIAGNOSIS — L738 Other specified follicular disorders: Secondary | ICD-10-CM | POA: Diagnosis not present

## 2014-02-08 DIAGNOSIS — IMO0002 Reserved for concepts with insufficient information to code with codable children: Secondary | ICD-10-CM | POA: Diagnosis not present

## 2014-02-13 DIAGNOSIS — E785 Hyperlipidemia, unspecified: Secondary | ICD-10-CM | POA: Diagnosis not present

## 2014-02-13 DIAGNOSIS — G609 Hereditary and idiopathic neuropathy, unspecified: Secondary | ICD-10-CM | POA: Diagnosis not present

## 2014-02-13 DIAGNOSIS — R7309 Other abnormal glucose: Secondary | ICD-10-CM | POA: Diagnosis not present

## 2014-02-13 DIAGNOSIS — E8881 Metabolic syndrome: Secondary | ICD-10-CM | POA: Diagnosis not present

## 2014-02-13 DIAGNOSIS — I252 Old myocardial infarction: Secondary | ICD-10-CM | POA: Diagnosis not present

## 2014-02-13 DIAGNOSIS — I10 Essential (primary) hypertension: Secondary | ICD-10-CM | POA: Diagnosis not present

## 2014-02-13 DIAGNOSIS — Z6825 Body mass index (BMI) 25.0-25.9, adult: Secondary | ICD-10-CM | POA: Diagnosis not present

## 2014-02-13 DIAGNOSIS — I259 Chronic ischemic heart disease, unspecified: Secondary | ICD-10-CM | POA: Diagnosis not present

## 2014-02-19 DIAGNOSIS — H353290 Exudative age-related macular degeneration, unspecified eye, stage unspecified: Secondary | ICD-10-CM | POA: Diagnosis not present

## 2014-03-13 DIAGNOSIS — L089 Local infection of the skin and subcutaneous tissue, unspecified: Secondary | ICD-10-CM | POA: Diagnosis not present

## 2014-03-13 DIAGNOSIS — L821 Other seborrheic keratosis: Secondary | ICD-10-CM | POA: Diagnosis not present

## 2014-03-13 DIAGNOSIS — L570 Actinic keratosis: Secondary | ICD-10-CM | POA: Diagnosis not present

## 2014-03-13 DIAGNOSIS — D239 Other benign neoplasm of skin, unspecified: Secondary | ICD-10-CM | POA: Diagnosis not present

## 2014-03-13 DIAGNOSIS — D1801 Hemangioma of skin and subcutaneous tissue: Secondary | ICD-10-CM | POA: Diagnosis not present

## 2014-03-13 DIAGNOSIS — Z85828 Personal history of other malignant neoplasm of skin: Secondary | ICD-10-CM | POA: Diagnosis not present

## 2014-03-13 DIAGNOSIS — L819 Disorder of pigmentation, unspecified: Secondary | ICD-10-CM | POA: Diagnosis not present

## 2014-04-03 DIAGNOSIS — H353290 Exudative age-related macular degeneration, unspecified eye, stage unspecified: Secondary | ICD-10-CM | POA: Diagnosis not present

## 2014-05-16 DIAGNOSIS — H353290 Exudative age-related macular degeneration, unspecified eye, stage unspecified: Secondary | ICD-10-CM | POA: Diagnosis not present

## 2014-06-26 DIAGNOSIS — H3532 Exudative age-related macular degeneration: Secondary | ICD-10-CM | POA: Diagnosis not present

## 2014-08-14 DIAGNOSIS — H3532 Exudative age-related macular degeneration: Secondary | ICD-10-CM | POA: Diagnosis not present

## 2014-08-21 DIAGNOSIS — H3532 Exudative age-related macular degeneration: Secondary | ICD-10-CM | POA: Diagnosis not present

## 2014-08-26 ENCOUNTER — Encounter: Payer: Self-pay | Admitting: Cardiovascular Disease

## 2014-08-26 ENCOUNTER — Ambulatory Visit (INDEPENDENT_AMBULATORY_CARE_PROVIDER_SITE_OTHER): Payer: Medicare Other | Admitting: Cardiovascular Disease

## 2014-08-26 VITALS — BP 122/82 | HR 66 | Ht 68.0 in | Wt 160.8 lb

## 2014-08-26 DIAGNOSIS — E785 Hyperlipidemia, unspecified: Secondary | ICD-10-CM | POA: Diagnosis not present

## 2014-08-26 DIAGNOSIS — I2510 Atherosclerotic heart disease of native coronary artery without angina pectoris: Secondary | ICD-10-CM | POA: Diagnosis not present

## 2014-08-26 DIAGNOSIS — I10 Essential (primary) hypertension: Secondary | ICD-10-CM

## 2014-08-26 NOTE — Assessment & Plan Note (Signed)
He is intolerant to statin but currently being treated with fenofibrate and Zetia.

## 2014-08-26 NOTE — Assessment & Plan Note (Signed)
He has been doing very well with no symptoms suggestive of angina. Continue medical therapy. Most recent stress test showed no evidence of ischemia with normal ejection fraction.

## 2014-08-26 NOTE — Assessment & Plan Note (Signed)
Blood pressure is well controlled on current medications. 

## 2014-08-26 NOTE — Progress Notes (Signed)
Primary care physician: Dr. Marton Redwood  HPI  This is a pleasant 73 year old man who is here today for a follow-up visit regarding coronary artery disease. The patient has known history of coronary artery disease status post myocardial infarction in 2000. He had PCI and 2 stent placement by Dr. Clayborn Bigness. Operative report is not available. He has chronic medical conditions that include hypertension, hyperlipidemia (intolerant to statins), type 2 diabetes and chronic kidney disease. He was seen in May of this year for atypical chest pain. I proceeded with a treadmill nuclear stress test which showed no evidence of ischemia with normal ejection fraction. He has been doing very well and denies any chest pain or shortness of breath.  He quit smoking in 2000. He does have family history of coronary artery disease but not prematurely.  Allergies  Allergen Reactions  . Lidocaine Anaphylaxis    novacaine and lidocaine     Current Outpatient Prescriptions on File Prior to Visit  Medication Sig Dispense Refill  . aspirin 81 MG tablet Take 81 mg by mouth daily.    . beta carotene w/minerals (OCUVITE) tablet Take 1 tablet by mouth daily.    Marland Kitchen ezetimibe (ZETIA) 10 MG tablet Take 10 mg by mouth daily.    . fenofibrate 160 MG tablet Take 160 mg by mouth daily.    Marland Kitchen gabapentin (NEURONTIN) 300 MG capsule Take 600 mg by mouth 3 (three) times daily.    . pantoprazole (PROTONIX) 40 MG tablet Take 40 mg by mouth daily.    . ramipril (ALTACE) 10 MG capsule Take 10 mg by mouth daily.     . vitamin E 100 UNIT capsule Take 100 Units by mouth daily.     No current facility-administered medications on file prior to visit.     Past Medical History  Diagnosis Date  . Diabetes mellitus without complication   . Dyslipidemia   . Metabolic syndrome   . MI (myocardial infarction)   . Peripheral neuropathy   . AAA (abdominal aortic aneurysm)   . Chronic renal disease, stage III   . Peripheral vascular disease     . Hypothyroidism   . Coronary artery disease     Myocardial infarction in 2000 status post PCI and 2 stent placement to unknown arteries  . Hyperlipidemia   . Hypertension   . Macular degeneration     bilateral     Past Surgical History  Procedure Laterality Date  . Coronary stent placement    . Cardiac catheterization  2000    [facility] x2 stents     Family History  Problem Relation Age of Onset  . Hypertension Mother      History   Social History  . Marital Status: Widowed    Spouse Name: N/A    Number of Children: N/A  . Years of Education: N/A   Occupational History  . Not on file.   Social History Main Topics  . Smoking status: Former Smoker -- 40 years    Types: Cigarettes    Quit date: 02/06/1999  . Smokeless tobacco: Not on file  . Alcohol Use: Yes     Comment: occasional  . Drug Use: No  . Sexual Activity: Not on file   Other Topics Concern  . Not on file   Social History Narrative     ROS A 10 point review of system was performed. It is negative other than that mentioned in the history of present illness.   PHYSICAL EXAM  BP 122/82 mmHg  Pulse 66  Ht 5\' 8"  (1.727 m)  Wt 160 lb 12 oz (72.916 kg)  BMI 24.45 kg/m2 Constitutional: He is oriented to person, place, and time. He appears well-developed and well-nourished. No distress.  HENT: No nasal discharge.  Head: Normocephalic and atraumatic.  Eyes: Pupils are equal and round.  No discharge. Neck: Normal range of motion. Neck supple. No JVD present. No thyromegaly present.  Cardiovascular: Normal rate, regular rhythm, normal heart sounds. Exam reveals no gallop and no friction rub. No murmur heard.  Pulmonary/Chest: Effort normal and breath sounds normal. No stridor. No respiratory distress. He has no wheezes. He has no rales. He exhibits no tenderness.  Abdominal: Soft. Bowel sounds are normal. He exhibits no distension. There is no tenderness. There is no rebound and no guarding.   Musculoskeletal: Normal range of motion. He exhibits no edema and no tenderness.  Neurological: He is alert and oriented to person, place, and time. Coordination normal.  Skin: Skin is warm and dry. No rash noted. He is not diaphoretic. No erythema. No pallor.  Psychiatric: He has a normal mood and affect. His behavior is normal. Judgment and thought content normal.       ASSESSMENT AND PLAN

## 2014-08-26 NOTE — Patient Instructions (Signed)
Continue same medications.   Your physician wants you to follow-up in: 1 year.  You will receive a reminder letter in the mail two months in advance. If you don't receive a letter, please call our office to schedule the follow-up appointment.  

## 2014-08-27 DIAGNOSIS — N183 Chronic kidney disease, stage 3 (moderate): Secondary | ICD-10-CM | POA: Diagnosis not present

## 2014-08-27 DIAGNOSIS — I10 Essential (primary) hypertension: Secondary | ICD-10-CM | POA: Diagnosis not present

## 2014-08-27 DIAGNOSIS — Z125 Encounter for screening for malignant neoplasm of prostate: Secondary | ICD-10-CM | POA: Diagnosis not present

## 2014-08-27 DIAGNOSIS — E785 Hyperlipidemia, unspecified: Secondary | ICD-10-CM | POA: Diagnosis not present

## 2014-08-27 DIAGNOSIS — E119 Type 2 diabetes mellitus without complications: Secondary | ICD-10-CM | POA: Diagnosis not present

## 2014-08-27 DIAGNOSIS — Z0000 Encounter for general adult medical examination without abnormal findings: Secondary | ICD-10-CM | POA: Diagnosis not present

## 2014-09-03 DIAGNOSIS — I714 Abdominal aortic aneurysm, without rupture: Secondary | ICD-10-CM | POA: Diagnosis not present

## 2014-09-03 DIAGNOSIS — I10 Essential (primary) hypertension: Secondary | ICD-10-CM | POA: Diagnosis not present

## 2014-09-03 DIAGNOSIS — E785 Hyperlipidemia, unspecified: Secondary | ICD-10-CM | POA: Diagnosis not present

## 2014-09-03 DIAGNOSIS — Z6825 Body mass index (BMI) 25.0-25.9, adult: Secondary | ICD-10-CM | POA: Diagnosis not present

## 2014-09-03 DIAGNOSIS — N183 Chronic kidney disease, stage 3 (moderate): Secondary | ICD-10-CM | POA: Diagnosis not present

## 2014-09-03 DIAGNOSIS — E039 Hypothyroidism, unspecified: Secondary | ICD-10-CM | POA: Diagnosis not present

## 2014-09-03 DIAGNOSIS — Z125 Encounter for screening for malignant neoplasm of prostate: Secondary | ICD-10-CM | POA: Diagnosis not present

## 2014-09-03 DIAGNOSIS — Z0000 Encounter for general adult medical examination without abnormal findings: Secondary | ICD-10-CM | POA: Diagnosis not present

## 2014-09-03 DIAGNOSIS — E8881 Metabolic syndrome: Secondary | ICD-10-CM | POA: Diagnosis not present

## 2014-09-03 DIAGNOSIS — R7301 Impaired fasting glucose: Secondary | ICD-10-CM | POA: Diagnosis not present

## 2014-09-03 DIAGNOSIS — Z9861 Coronary angioplasty status: Secondary | ICD-10-CM | POA: Diagnosis not present

## 2014-09-03 DIAGNOSIS — G629 Polyneuropathy, unspecified: Secondary | ICD-10-CM | POA: Diagnosis not present

## 2014-09-05 DIAGNOSIS — I714 Abdominal aortic aneurysm, without rupture: Secondary | ICD-10-CM | POA: Diagnosis not present

## 2014-09-09 DIAGNOSIS — Z1212 Encounter for screening for malignant neoplasm of rectum: Secondary | ICD-10-CM | POA: Diagnosis not present

## 2014-10-02 DIAGNOSIS — H3532 Exudative age-related macular degeneration: Secondary | ICD-10-CM | POA: Diagnosis not present

## 2014-10-04 DIAGNOSIS — H3532 Exudative age-related macular degeneration: Secondary | ICD-10-CM | POA: Diagnosis not present

## 2014-11-11 DIAGNOSIS — H3532 Exudative age-related macular degeneration: Secondary | ICD-10-CM | POA: Diagnosis not present

## 2014-11-20 DIAGNOSIS — H3532 Exudative age-related macular degeneration: Secondary | ICD-10-CM | POA: Diagnosis not present

## 2014-12-12 DIAGNOSIS — M25561 Pain in right knee: Secondary | ICD-10-CM | POA: Diagnosis not present

## 2014-12-23 DIAGNOSIS — H3532 Exudative age-related macular degeneration: Secondary | ICD-10-CM | POA: Diagnosis not present

## 2015-01-08 DIAGNOSIS — H3532 Exudative age-related macular degeneration: Secondary | ICD-10-CM | POA: Diagnosis not present

## 2015-02-03 DIAGNOSIS — H3532 Exudative age-related macular degeneration: Secondary | ICD-10-CM | POA: Diagnosis not present

## 2015-02-21 DIAGNOSIS — H3532 Exudative age-related macular degeneration: Secondary | ICD-10-CM | POA: Diagnosis not present

## 2015-02-28 DIAGNOSIS — H3532 Exudative age-related macular degeneration: Secondary | ICD-10-CM | POA: Diagnosis not present

## 2015-03-10 DIAGNOSIS — H3532 Exudative age-related macular degeneration: Secondary | ICD-10-CM | POA: Diagnosis not present

## 2015-03-13 DIAGNOSIS — L821 Other seborrheic keratosis: Secondary | ICD-10-CM | POA: Diagnosis not present

## 2015-03-13 DIAGNOSIS — L570 Actinic keratosis: Secondary | ICD-10-CM | POA: Diagnosis not present

## 2015-03-13 DIAGNOSIS — Z85828 Personal history of other malignant neoplasm of skin: Secondary | ICD-10-CM | POA: Diagnosis not present

## 2015-03-13 DIAGNOSIS — D1800 Hemangioma unspecified site: Secondary | ICD-10-CM | POA: Diagnosis not present

## 2015-03-13 DIAGNOSIS — L814 Other melanin hyperpigmentation: Secondary | ICD-10-CM | POA: Diagnosis not present

## 2015-04-07 DIAGNOSIS — H3532 Exudative age-related macular degeneration: Secondary | ICD-10-CM | POA: Diagnosis not present

## 2015-05-12 DIAGNOSIS — H3532 Exudative age-related macular degeneration: Secondary | ICD-10-CM | POA: Diagnosis not present

## 2015-05-21 DIAGNOSIS — H3532 Exudative age-related macular degeneration: Secondary | ICD-10-CM | POA: Diagnosis not present

## 2015-07-04 DIAGNOSIS — H353222 Exudative age-related macular degeneration, left eye, with inactive choroidal neovascularization: Secondary | ICD-10-CM | POA: Diagnosis not present

## 2015-07-21 DIAGNOSIS — H353212 Exudative age-related macular degeneration, right eye, with inactive choroidal neovascularization: Secondary | ICD-10-CM | POA: Diagnosis not present

## 2015-08-18 DIAGNOSIS — H353222 Exudative age-related macular degeneration, left eye, with inactive choroidal neovascularization: Secondary | ICD-10-CM | POA: Diagnosis not present

## 2015-08-20 ENCOUNTER — Ambulatory Visit: Payer: Medicare Other | Admitting: Cardiovascular Disease

## 2015-08-31 DIAGNOSIS — I7121 Aneurysm of the ascending aorta, without rupture: Secondary | ICD-10-CM

## 2015-08-31 HISTORY — DX: Aneurysm of the ascending aorta, without rupture: I71.21

## 2015-09-05 DIAGNOSIS — Z125 Encounter for screening for malignant neoplasm of prostate: Secondary | ICD-10-CM | POA: Diagnosis not present

## 2015-09-05 DIAGNOSIS — E038 Other specified hypothyroidism: Secondary | ICD-10-CM | POA: Diagnosis not present

## 2015-09-05 DIAGNOSIS — I10 Essential (primary) hypertension: Secondary | ICD-10-CM | POA: Diagnosis not present

## 2015-09-05 DIAGNOSIS — R7301 Impaired fasting glucose: Secondary | ICD-10-CM | POA: Diagnosis not present

## 2015-09-10 DIAGNOSIS — Z9861 Coronary angioplasty status: Secondary | ICD-10-CM | POA: Diagnosis not present

## 2015-09-10 DIAGNOSIS — I252 Old myocardial infarction: Secondary | ICD-10-CM | POA: Diagnosis not present

## 2015-09-10 DIAGNOSIS — E784 Other hyperlipidemia: Secondary | ICD-10-CM | POA: Diagnosis not present

## 2015-09-10 DIAGNOSIS — Z0000 Encounter for general adult medical examination without abnormal findings: Secondary | ICD-10-CM | POA: Diagnosis not present

## 2015-09-10 DIAGNOSIS — I10 Essential (primary) hypertension: Secondary | ICD-10-CM | POA: Diagnosis not present

## 2015-09-10 DIAGNOSIS — E8881 Metabolic syndrome: Secondary | ICD-10-CM | POA: Diagnosis not present

## 2015-09-10 DIAGNOSIS — Z1389 Encounter for screening for other disorder: Secondary | ICD-10-CM | POA: Diagnosis not present

## 2015-09-10 DIAGNOSIS — R7301 Impaired fasting glucose: Secondary | ICD-10-CM | POA: Diagnosis not present

## 2015-09-10 DIAGNOSIS — Z6824 Body mass index (BMI) 24.0-24.9, adult: Secondary | ICD-10-CM | POA: Diagnosis not present

## 2015-09-10 DIAGNOSIS — I714 Abdominal aortic aneurysm, without rupture: Secondary | ICD-10-CM | POA: Diagnosis not present

## 2015-09-10 DIAGNOSIS — N183 Chronic kidney disease, stage 3 (moderate): Secondary | ICD-10-CM | POA: Diagnosis not present

## 2015-09-17 ENCOUNTER — Other Ambulatory Visit (HOSPITAL_COMMUNITY): Payer: Medicare Other

## 2015-09-22 ENCOUNTER — Ambulatory Visit (HOSPITAL_COMMUNITY)
Admission: RE | Admit: 2015-09-22 | Discharge: 2015-09-22 | Disposition: A | Discharge status: Home / Self Care | Payer: Medicare Other | Source: Ambulatory Visit | Attending: Surgery | Admitting: Surgery

## 2015-09-22 ENCOUNTER — Other Ambulatory Visit (HOSPITAL_COMMUNITY): Payer: Self-pay | Admitting: Internal Medicine

## 2015-09-22 DIAGNOSIS — I129 Hypertensive chronic kidney disease with stage 1 through stage 4 chronic kidney disease, or unspecified chronic kidney disease: Secondary | ICD-10-CM | POA: Diagnosis not present

## 2015-09-22 DIAGNOSIS — N183 Chronic kidney disease, stage 3 (moderate): Secondary | ICD-10-CM | POA: Insufficient documentation

## 2015-09-22 DIAGNOSIS — E1122 Type 2 diabetes mellitus with diabetic chronic kidney disease: Secondary | ICD-10-CM | POA: Insufficient documentation

## 2015-09-22 DIAGNOSIS — I714 Abdominal aortic aneurysm, without rupture, unspecified: Secondary | ICD-10-CM

## 2015-09-22 DIAGNOSIS — E785 Hyperlipidemia, unspecified: Secondary | ICD-10-CM | POA: Diagnosis not present

## 2015-09-29 DIAGNOSIS — H353222 Exudative age-related macular degeneration, left eye, with inactive choroidal neovascularization: Secondary | ICD-10-CM | POA: Diagnosis not present

## 2015-10-06 DIAGNOSIS — H353222 Exudative age-related macular degeneration, left eye, with inactive choroidal neovascularization: Secondary | ICD-10-CM | POA: Diagnosis not present

## 2015-10-06 DIAGNOSIS — H353212 Exudative age-related macular degeneration, right eye, with inactive choroidal neovascularization: Secondary | ICD-10-CM | POA: Diagnosis not present

## 2015-10-31 ENCOUNTER — Ambulatory Visit (INDEPENDENT_AMBULATORY_CARE_PROVIDER_SITE_OTHER): Payer: Medicare Other | Admitting: Cardiovascular Disease

## 2015-10-31 ENCOUNTER — Encounter: Payer: Self-pay | Admitting: Cardiovascular Disease

## 2015-10-31 VITALS — BP 142/88 | HR 61 | Ht 67.5 in | Wt 157.5 lb

## 2015-10-31 DIAGNOSIS — I10 Essential (primary) hypertension: Secondary | ICD-10-CM | POA: Diagnosis not present

## 2015-10-31 DIAGNOSIS — I2510 Atherosclerotic heart disease of native coronary artery without angina pectoris: Secondary | ICD-10-CM | POA: Diagnosis not present

## 2015-10-31 NOTE — Progress Notes (Signed)
Cardiology Office Note   Date:  10/31/2015   ID:  Thomas Fuentes, DOB 05/30/1941, MRN [id number]  PCP:  Marton Redwood, MD  Cardiologist:   Kathlyn Sacramento, MD   Chief Complaint  Patient presents with  . other    12 month follow up. Meds reviewed by the patient verbally. "doing well."       History of Present Illness: Thomas Fuentes is a 75 y.o. male who presents for a follow-up visit regarding coronary artery disease. The patient has known history of coronary artery disease status post myocardial infarction in 2000. He had PCI and 2 stent placement by Dr. Clayborn Bigness. Operative report is not available. He has chronic medical conditions that include hypertension, hyperlipidemia (intolerant to statins), type 2 diabetes and chronic kidney disease.  Most recent treadmill nuclear stress test in May 2015 showed no evidence of ischemia with normal ejection fraction. He has been doing very well and denies any chest pain or shortness of breath.   He continues to be very active with no exertional symptoms.    Past Medical History  Diagnosis Date  . Diabetes mellitus without complication ([place])   . Dyslipidemia   . Metabolic syndrome   . MI (myocardial infarction) ([place])   . Peripheral neuropathy ([place])   . AAA (abdominal aortic aneurysm) ([place])   . Chronic renal disease, stage III   . Peripheral vascular disease ([place])   . Hypothyroidism   . Coronary artery disease     Myocardial infarction in 2000 status post PCI and 2 stent placement to unknown arteries  . Hyperlipidemia   . Hypertension   . Macular degeneration     bilateral    Past Surgical History  Procedure Laterality Date  . Coronary stent placement    . Cardiac catheterization  2000    [facility] x2 stents     Current Outpatient Prescriptions  Medication Sig Dispense Refill  . aspirin 81 MG tablet Take 81 mg by mouth daily.    . beta carotene w/minerals (OCUVITE) tablet Take 1 tablet by mouth daily.    Marland Kitchen ezetimibe (ZETIA)  10 MG tablet Take 10 mg by mouth daily.    . fenofibrate 160 MG tablet Take 160 mg by mouth daily.    Marland Kitchen gabapentin (NEURONTIN) 300 MG capsule Take 600 mg by mouth 3 (three) times daily.    . pantoprazole (PROTONIX) 40 MG tablet Take 40 mg by mouth daily.    . ramipril (ALTACE) 10 MG capsule Take 10 mg by mouth daily.     . vitamin E 100 UNIT capsule Take 100 Units by mouth daily.     No current facility-administered medications for this visit.    Allergies:   Lidocaine and Statins    Social History:  The patient  reports that he quit smoking about 16 years ago. His smoking use included Cigarettes. He quit after 40 years of use. He does not have any smokeless tobacco history on file. He reports that he drinks alcohol. He reports that he does not use illicit drugs.   Family History:  The patient's family history includes Hypertension in his mother.    ROS:  Please see the history of present illness.   Otherwise, review of systems are positive for none.   All other systems are reviewed and negative.    PHYSICAL EXAM: VS:  BP 142/88 mmHg  Pulse 61  Ht 5' 7.5" (1.715 m)  Wt 157 lb 8 oz (71.442 kg)  BMI  24.29 kg/m2 , BMI Body mass index is 24.29 kg/(m^2). GEN: Well nourished, well developed, in no acute distress HEENT: normal Neck: no JVD, carotid bruits, or masses Cardiac: RRR; no murmurs, rubs, or gallops,no edema  Respiratory:  clear to auscultation bilaterally, normal work of breathing GI: soft, nontender, nondistended, + BS MS: no deformity or atrophy Skin: warm and dry, no rash Neuro:  Strength and sensation are intact Psych: euthymic mood, full affect   EKG:  EKG is ordered today. The ekg ordered today demonstrates normal sinus rhythm with first-degree AV block. No significant ST or T wave changes.   Recent Labs: No results found for requested labs within last 365 days.    Lipid Panel No results found for: CHOL, TRIG, HDL, CHOLHDL, VLDL, LDLCALC, LDLDIRECT    Wt  Readings from Last 3 Encounters:  10/31/15 157 lb 8 oz (71.442 kg)  08/26/14 160 lb 12 oz (72.916 kg)  01/22/14 158 lb (71.668 kg)        ASSESSMENT AND PLAN:  1.  Coronary artery disease involving native coronary arteries without angina: The patient is doing very well and continues to be asymptomatic. Continue medical therapy and lifestyle changes.  2. Hyperlipidemia: This is being managed by his primary care physician. The patient is known to be intolerant to statins due to GI symptoms and currently he is on fenofibrate and Zetia.  3. Essential hypertension: Blood pressure is reasonably controlled on ramipril.      Disposition:   FU with me in 1 year  Signed, Kathlyn Sacramento, MD  10/31/2015 5:16 PM    [place]

## 2015-10-31 NOTE — Patient Instructions (Signed)
Medication Instructions: Continue same medications.   Labwork: None.   Procedures/Testing: None.   Follow-Up: 1 year follow up with Dr. Shanece Cochrane.   Any Additional Special Instructions Will Be Listed Below (If Applicable).     If you need a refill on your cardiac medications before your next appointment, please call your pharmacy.   

## 2015-11-07 DIAGNOSIS — H353222 Exudative age-related macular degeneration, left eye, with inactive choroidal neovascularization: Secondary | ICD-10-CM | POA: Diagnosis not present

## 2015-12-19 DIAGNOSIS — H353222 Exudative age-related macular degeneration, left eye, with inactive choroidal neovascularization: Secondary | ICD-10-CM | POA: Diagnosis not present

## 2015-12-22 DIAGNOSIS — H353212 Exudative age-related macular degeneration, right eye, with inactive choroidal neovascularization: Secondary | ICD-10-CM | POA: Diagnosis not present

## 2016-01-30 DIAGNOSIS — H353222 Exudative age-related macular degeneration, left eye, with inactive choroidal neovascularization: Secondary | ICD-10-CM | POA: Diagnosis not present

## 2016-03-08 DIAGNOSIS — H353212 Exudative age-related macular degeneration, right eye, with inactive choroidal neovascularization: Secondary | ICD-10-CM | POA: Diagnosis not present

## 2016-03-12 DIAGNOSIS — H353222 Exudative age-related macular degeneration, left eye, with inactive choroidal neovascularization: Secondary | ICD-10-CM | POA: Diagnosis not present

## 2016-03-17 DIAGNOSIS — Z85828 Personal history of other malignant neoplasm of skin: Secondary | ICD-10-CM | POA: Diagnosis not present

## 2016-03-17 DIAGNOSIS — D225 Melanocytic nevi of trunk: Secondary | ICD-10-CM | POA: Diagnosis not present

## 2016-03-17 DIAGNOSIS — L814 Other melanin hyperpigmentation: Secondary | ICD-10-CM | POA: Diagnosis not present

## 2016-03-17 DIAGNOSIS — D1800 Hemangioma unspecified site: Secondary | ICD-10-CM | POA: Diagnosis not present

## 2016-03-17 DIAGNOSIS — L570 Actinic keratosis: Secondary | ICD-10-CM | POA: Diagnosis not present

## 2016-03-17 DIAGNOSIS — L821 Other seborrheic keratosis: Secondary | ICD-10-CM | POA: Diagnosis not present

## 2016-04-23 DIAGNOSIS — H353222 Exudative age-related macular degeneration, left eye, with inactive choroidal neovascularization: Secondary | ICD-10-CM | POA: Diagnosis not present

## 2016-05-31 DIAGNOSIS — H353212 Exudative age-related macular degeneration, right eye, with inactive choroidal neovascularization: Secondary | ICD-10-CM | POA: Diagnosis not present

## 2016-06-04 DIAGNOSIS — H353222 Exudative age-related macular degeneration, left eye, with inactive choroidal neovascularization: Secondary | ICD-10-CM | POA: Diagnosis not present

## 2016-07-16 DIAGNOSIS — H353222 Exudative age-related macular degeneration, left eye, with inactive choroidal neovascularization: Secondary | ICD-10-CM | POA: Diagnosis not present

## 2016-08-08 ENCOUNTER — Observation Stay
Admission: EM | Admit: 2016-08-08 | Discharge: 2016-08-09 | Payer: Medicare Other | Attending: Internal Medicine | Admitting: Internal Medicine

## 2016-08-08 ENCOUNTER — Emergency Department: Payer: Medicare Other

## 2016-08-08 ENCOUNTER — Encounter: Payer: Self-pay | Admitting: Emergency Medicine

## 2016-08-08 DIAGNOSIS — Z884 Allergy status to anesthetic agent status: Secondary | ICD-10-CM | POA: Insufficient documentation

## 2016-08-08 DIAGNOSIS — I10 Essential (primary) hypertension: Secondary | ICD-10-CM | POA: Diagnosis not present

## 2016-08-08 DIAGNOSIS — E1142 Type 2 diabetes mellitus with diabetic polyneuropathy: Secondary | ICD-10-CM | POA: Diagnosis not present

## 2016-08-08 DIAGNOSIS — E039 Hypothyroidism, unspecified: Secondary | ICD-10-CM | POA: Diagnosis not present

## 2016-08-08 DIAGNOSIS — R079 Chest pain, unspecified: Secondary | ICD-10-CM

## 2016-08-08 DIAGNOSIS — I714 Abdominal aortic aneurysm, without rupture, unspecified: Secondary | ICD-10-CM | POA: Diagnosis present

## 2016-08-08 DIAGNOSIS — E1122 Type 2 diabetes mellitus with diabetic chronic kidney disease: Secondary | ICD-10-CM | POA: Insufficient documentation

## 2016-08-08 DIAGNOSIS — Z8249 Family history of ischemic heart disease and other diseases of the circulatory system: Secondary | ICD-10-CM | POA: Insufficient documentation

## 2016-08-08 DIAGNOSIS — Z955 Presence of coronary angioplasty implant and graft: Secondary | ICD-10-CM | POA: Diagnosis not present

## 2016-08-08 DIAGNOSIS — Z87891 Personal history of nicotine dependence: Secondary | ICD-10-CM | POA: Insufficient documentation

## 2016-08-08 DIAGNOSIS — I214 Non-ST elevation (NSTEMI) myocardial infarction: Principal | ICD-10-CM | POA: Insufficient documentation

## 2016-08-08 DIAGNOSIS — H3530 Unspecified macular degeneration: Secondary | ICD-10-CM | POA: Insufficient documentation

## 2016-08-08 DIAGNOSIS — Z7984 Long term (current) use of oral hypoglycemic drugs: Secondary | ICD-10-CM | POA: Insufficient documentation

## 2016-08-08 DIAGNOSIS — Z23 Encounter for immunization: Secondary | ICD-10-CM | POA: Insufficient documentation

## 2016-08-08 DIAGNOSIS — I2510 Atherosclerotic heart disease of native coronary artery without angina pectoris: Secondary | ICD-10-CM | POA: Diagnosis not present

## 2016-08-08 DIAGNOSIS — I252 Old myocardial infarction: Secondary | ICD-10-CM

## 2016-08-08 DIAGNOSIS — N183 Chronic kidney disease, stage 3 unspecified: Secondary | ICD-10-CM | POA: Diagnosis present

## 2016-08-08 DIAGNOSIS — I088 Other rheumatic multiple valve diseases: Secondary | ICD-10-CM | POA: Diagnosis not present

## 2016-08-08 DIAGNOSIS — Z888 Allergy status to other drugs, medicaments and biological substances status: Secondary | ICD-10-CM | POA: Diagnosis not present

## 2016-08-08 DIAGNOSIS — Z7982 Long term (current) use of aspirin: Secondary | ICD-10-CM | POA: Diagnosis not present

## 2016-08-08 DIAGNOSIS — Z8679 Personal history of other diseases of the circulatory system: Secondary | ICD-10-CM | POA: Diagnosis not present

## 2016-08-08 DIAGNOSIS — E781 Pure hyperglyceridemia: Secondary | ICD-10-CM | POA: Insufficient documentation

## 2016-08-08 DIAGNOSIS — R748 Abnormal levels of other serum enzymes: Secondary | ICD-10-CM | POA: Diagnosis not present

## 2016-08-08 DIAGNOSIS — E1151 Type 2 diabetes mellitus with diabetic peripheral angiopathy without gangrene: Secondary | ICD-10-CM | POA: Diagnosis not present

## 2016-08-08 DIAGNOSIS — E785 Hyperlipidemia, unspecified: Secondary | ICD-10-CM | POA: Diagnosis present

## 2016-08-08 DIAGNOSIS — N189 Chronic kidney disease, unspecified: Secondary | ICD-10-CM | POA: Diagnosis not present

## 2016-08-08 DIAGNOSIS — I129 Hypertensive chronic kidney disease with stage 1 through stage 4 chronic kidney disease, or unspecified chronic kidney disease: Secondary | ICD-10-CM | POA: Insufficient documentation

## 2016-08-08 HISTORY — DX: Old myocardial infarction: I25.2

## 2016-08-08 LAB — BASIC METABOLIC PANEL
ANION GAP: 7 (ref 5–15)
BUN: 27 mg/dL — ABNORMAL HIGH (ref 6–20)
CALCIUM: 9.8 mg/dL (ref 8.9–10.3)
CO2: 28 mmol/L (ref 22–32)
Chloride: 104 mmol/L (ref 101–111)
Creatinine, Ser: 1.27 mg/dL — ABNORMAL HIGH (ref 0.61–1.24)
GFR, EST NON AFRICAN AMERICAN: 53 mL/min — AB (ref >60)
GLUCOSE: 87 mg/dL (ref 65–99)
POTASSIUM: 4 mmol/L (ref 3.5–5.1)
SODIUM: 139 mmol/L (ref 135–145)

## 2016-08-08 LAB — CBC
HEMATOCRIT: 42.3 % (ref 40.0–52.0)
HEMOGLOBIN: 14.2 g/dL (ref 13.0–18.0)
MCH: 30.8 pg (ref 26.0–34.0)
MCHC: 33.5 g/dL (ref 32.0–36.0)
MCV: 91.9 fL (ref 80.0–100.0)
Platelets: 230 10*3/uL (ref 150–440)
RBC: 4.61 MIL/uL (ref 4.40–5.90)
RDW: 13.8 % (ref 11.5–14.5)
WBC: 7.6 10*3/uL (ref 3.8–10.6)

## 2016-08-08 LAB — TROPONIN I: TROPONIN I: 0.03 ng/mL — AB (ref <0.03)

## 2016-08-08 MED ORDER — ENOXAPARIN SODIUM 80 MG/0.8ML ~~LOC~~ SOLN
1.0000 mg/kg | Freq: Once | SUBCUTANEOUS | Status: AC
  Administered 2016-08-08: 70 mg via SUBCUTANEOUS

## 2016-08-08 MED ORDER — PANTOPRAZOLE SODIUM 40 MG PO TBEC: 40.0000 mg | DELAYED_RELEASE_TABLET | Freq: Every day | ORAL | Status: DC

## 2016-08-08 MED ORDER — ENOXAPARIN SODIUM 100 MG/ML ~~LOC~~ SOLN
SUBCUTANEOUS | Status: AC
  Filled 2016-08-08: qty 1

## 2016-08-08 MED ORDER — ASPIRIN 81 MG PO CHEW
324.0000 mg | CHEWABLE_TABLET | Freq: Once | ORAL | Status: AC
  Administered 2016-08-08: 324 mg via ORAL
  Filled 2016-08-08: qty 4

## 2016-08-08 MED ORDER — ALUM & MAG HYDROXIDE-SIMETH 200-200-20 MG/5ML PO SUSP: 30.0000 mL | Freq: Four times a day (QID) | ORAL | Status: DC | PRN

## 2016-08-08 MED ORDER — NITROGLYCERIN 0.4 MG SL SUBL
0.4000 mg | SUBLINGUAL_TABLET | Freq: Once | SUBLINGUAL | Status: AC
  Administered 2016-08-08: 0.4 mg via SUBLINGUAL
  Filled 2016-08-08: qty 1

## 2016-08-08 NOTE — ED Triage Notes (Signed)
C/O epigastric pain that started today around noon.  Patient states pain is similar to the pain he felt when he had a heart attack in 2002.

## 2016-08-08 NOTE — H&P (Signed)
[place] @ [facility] Admission History and Physical Harvie Bridge, D.O.  ---------------------------------------------------------------------------------------------------------------------   PATIENT NAME: Thomas Fuentes MR#: [id number] DATE OF BIRTH: [birth year] DATE OF ADMISSION: 08/08/2016 PRIMARY CARE PHYSICIAN: Marton Redwood, MD  REQUESTING/REFERRING PHYSICIAN: ED Dr. Jimmye Norman  CHIEF COMPLAINT: Chief Complaint  Patient presents with  . Chest Pain    HISTORY OF PRESENT ILLNESS: Thomas Fuentes is a 75 y.o. male with a known history of AAA, CK D stage III, coronary artery disease status post MI in 2000 and PCI with stents, hypertension, hyperlipidemia presents to the emergency department complaining of chest pain.  Patient was in a usual state of health until this afternoon when he experienced mid epigastric pain described as discomfort, moderate, localized, associated with burping, made worse with bending at the waist or sitting up straight, made better by lying flat. He reports that nitroglycerin did not help his pain Patient stated his symptoms were similar to the pain he experienced when he had a heart attack requiring stents in 2002. He denies any associated palpitations, shortness of breath, dizziness, lightheadedness, nausea or diaphoresis. Patient denies fevers/chills, weakness, dizziness, chest pain, shortness of breath, N/V/C/D, abdominal pain, dysuria/frequency, changes in mental status.  Otherwise there has been no change in status. Patient has been taking medication as prescribed and there has been no recent change in medication or diet.  There has been no recent illness, travel or sick contacts.     EMS/ED COURSE:   Patient received aspirin, Lovenox, nitroglycerin  PAST MEDICAL HISTORY: Past Medical History:  Diagnosis Date  . AAA (abdominal aortic aneurysm) ([place])   . Chronic renal disease, stage III   . Coronary artery disease    Myocardial  infarction in 2000 status post PCI and 2 stent placement to unknown arteries  . Diabetes mellitus without complication ([place])   . Dyslipidemia   . Hyperlipidemia   . Hypertension   . Hypothyroidism   . Macular degeneration    bilateral  . Metabolic syndrome   . MI (myocardial infarction)   . Peripheral neuropathy ([place])   . Peripheral vascular disease ([place])     Please note patient denies history of diabetes  PAST SURGICAL HISTORY: Past Surgical History:  Procedure Laterality Date  . CARDIAC CATHETERIZATION  2000   [place] x2 stents  . CORONARY STENT PLACEMENT        SOCIAL HISTORY: Social History  Substance Use Topics  . Smoking status: Former Smoker    Years: 40.00    Types: Cigarettes    Quit date: 02/06/1999  . Smokeless tobacco: Never Used  . Alcohol use Yes     Comment: occasional      FAMILY HISTORY: Family History  Problem Relation Age of Onset  . Hypertension Mother   Otherwise negative. Confirmed with patient   MEDICATIONS AT HOME: Prior to Admission medications   Medication Sig Start Date End Date Taking? Authorizing Provider  aspirin 81 MG tablet Take 81 mg by mouth daily.   Yes Historical Provider, MD  beta carotene w/minerals (OCUVITE) tablet Take 1 tablet by mouth daily.   Yes Historical Provider, MD  fenofibrate 160 MG tablet Take 160 mg by mouth daily.   Yes Historical Provider, MD  gabapentin (NEURONTIN) 300 MG capsule Take 600 mg by mouth 3 (three) times daily.   Yes Historical Provider, MD  pantoprazole (PROTONIX) 40 MG tablet Take 40 mg by mouth daily.   Yes Historical Provider, MD  ramipril (ALTACE) 10 MG capsule Take 10 mg  by mouth daily.    Yes Historical Provider, MD  ezetimibe (ZETIA) 10 MG tablet Take 10 mg by mouth daily.    Historical Provider, MD  vitamin E 100 UNIT capsule Take 100 Units by mouth daily.    Historical Provider, MD      DRUG ALLERGIES: Allergies  Allergen Reactions  . Lidocaine Anaphylaxis    novacaine and lidocaine   . Statins     GI symptoms on almost all statins     REVIEW OF SYSTEMS: CONSTITUTIONAL: No fatigue, weakness, fever, chills, weight gain/loss, headache EYES: No blurry or double vision. ENT: No tinnitus, postnasal drip, redness or soreness of the oropharynx. RESPIRATORY: No dyspnea, cough, wheeze, hemoptysis. CARDIOVASCULAR: No chest pain, orthopnea, palpitations, syncope. GASTROINTESTINAL: Positive midepigastric abdominal pain, burping. No nausea, vomiting, constipation, diarrhea, abdominal pain. No hematemesis, melena or hematochezia. GENITOURINARY: No dysuria, frequency, hematuria. ENDOCRINE: No polyuria or nocturia. No heat or cold intolerance. HEMATOLOGY: No anemia, bruising, bleeding. INTEGUMENTARY: No rashes, ulcers, lesions. MUSCULOSKELETAL: No pain, arthritis, swelling, gout. NEUROLOGIC: No numbness, tingling, weakness or ataxia. No seizure-type activity. PSYCHIATRIC: No anxiety, depression, insomnia.  PHYSICAL EXAMINATION: VITAL SIGNS: Blood pressure (!) 146/77, pulse (!) 59, temperature 97.9 F (36.6 C), resp. rate 15, height 5\' 8"  (1.727 m), weight 71.2 kg (157 lb), SpO2 96 %.  GENERAL: 75 y.o.-year-old white male patient, well-developed, well-nourished lying in the bed in no acute distress.  Pleasant and cooperative.   HEENT: Head atraumatic, normocephalic. Pupils equal, round, reactive to light and accommodation. No scleral icterus. Extraocular muscles intact. Oropharynx is clear. Mucus membranes moist. NECK: Supple, full range of motion. No JVD, no bruit heard. No cervical lymphadenopathy. CHEST: Normal breath sounds bilaterally. No wheezing, rales, rhonchi or crackles. No use of accessory muscles of respiration.  No reproducible chest wall tenderness.  CARDIOVASCULAR: S1, S2 normal. No murmurs, rubs, or gallops appreciated. Cap refill <2 seconds. ABDOMEN: Soft, nontender, nondistended. No rebound, guarding, rigidity. Normoactive bowel sounds present in all four  quadrants. No organomegaly or mass. EXTREMITIES: Full range of motion. No pedal edema, cyanosis, or clubbing. NEUROLOGIC: Cranial nerves II through XII are grossly intact with no focal sensorimotor deficit. Muscle strength 5/5 in all extremities. Sensation intact. Gait not checked. PSYCHIATRIC: The patient is alert and oriented x 3. Normal affect, mood, thought content. SKIN: Warm, dry, and intact without obvious rash, lesion, or ulcer.  LABORATORY PANEL:  CBC  Recent Labs Lab 08/08/16 1842  WBC 7.6  HGB 14.2  HCT 42.3  PLT 230   ----------------------------------------------------------------------------------------------------------------- Chemistries  Recent Labs Lab 08/08/16 1842  NA 139  K 4.0  CL 104  CO2 28  GLUCOSE 87  BUN 27*  CREATININE 1.27*  CALCIUM 9.8   ------------------------------------------------------------------------------------------------------------------ Cardiac Enzymes  Recent Labs Lab 08/08/16 1842  TROPONINI 0.03*   ------------------------------------------------------------------------------------------------------------------  RADIOLOGY: Dg Chest 2 View  Result Date: 08/08/2016 CLINICAL DATA:  Sternal chest pain beginning mid date, history coronary disease post MI and stenting, former smoker, diabetes mellitus, abdominal aortic aneurysm, hypertension EXAM: CHEST  2 VIEW COMPARISON:  01/18/2014 FINDINGS: Enlargement of cardiac silhouette. Mediastinal contours and pulmonary vascularity normal. Bronchitic changes without infiltrate, pleural effusion or pneumothorax. Underlying emphysematous changes. Bones appear demineralized. IMPRESSION: Enlargement of cardiac silhouette. Emphysematous and bronchitic changes likely reflecting COPD. No acute infiltrate. Electronically Signed   By: Lavonia Dana M.D.   On: 08/08/2016 19:10    EKG: Normal sinus rhythm at 60 bpm with normal axis, first-degree AV block and nonspecific ST-T wave changes.  IMPRESSION AND PLAN:  This is a 75 y.o. male with a history of AAA, CK D stage III, coronary artery disease status post MI in 2000 and PCI with stents, hypertension, hyperlipidemia now being admitted with: 1. Atypical chest pain and an elevated troponin-admit to observation with telemetry monitoring. We'll trend troponins, check lipids and TSH. Patient already received aspirin and Lovenox at 70 mg. He is allergic to statins but we will add beta blocker, morphine, nitrates as needed. Cardiology consultation has been requested and she will be nothing by mouth after midnight. For symptoms consistent with indigestion will offer Maalox and continue Protonix. 2. History of CK D-renal function is improved from baseline. Repeat BMP in a.m. 3. History of coronary artery disease-continue aspirin 4. History of hyperlipidemia-continue Zetia and fenofibrate. Patient reports GI symptoms with statins and therefore does not take. 5. History of hypertension-continue Altase 6. Patient may continue vitamin D and Ocuvite.  Diet/Nutrition: Heart healthy Fluids: Hep-Lock DVT Px: Lovenox, SCDs and early ambulation Code Status: Full  All the records are reviewed and case discussed with ED provider. Management plans discussed with the patient and/or family who express understanding and agree with plan of care.   TOTAL TIME TAKING CARE OF THIS PATIENT: 60 minutes.   Jairon Ripberger D.O. on 08/08/2016 at 11:18 PM Between 7am to 6pm - Pager - [phone] After 6pm go to [email or web address] - [employer] [place] [facility] Office [phone] CC: Primary care physician; Marton Redwood, MD     Note: This dictation was prepared with Dragon dictation along with smaller phrase technology. Any transcriptional errors that result from this process are unintentional.

## 2016-08-08 NOTE — ED Notes (Signed)
Pt states that this afternoon he started having epigastric pain, pt states that he then checked his bp and reports that it was elevated and he became concerned, pt states that he felt similar to this a few years ago as well as in 2000 and in 2000 he was having a heart attack, pt states that with position change in the bed the discomfort is relieved, no pain with palpation but pt states that the location of the discomfort is near where I pressed, no distress noted at this time, cont to monitor

## 2016-08-08 NOTE — ED Provider Notes (Signed)
[facility] Emergency Department Provider Note        Time seen: ----------------------------------------- 8:31 PM on 08/08/2016 -----------------------------------------    I have reviewed the triage vital signs and the nursing notes.   HISTORY  Chief Complaint Chest Pain    HPI Thomas Fuentes is a 75 y.o. male who presents to ER for chest pain started around noon today. Patient states pain is similar to the pain he felt when he had a heart attack back in 2002. Patient denied any sweats, nausea or shortness of breath. Patient has not had pain except on rare occasions in the last decade. Nothing made his pain better or worse.   Past Medical History:  Diagnosis Date  . AAA (abdominal aortic aneurysm) ([place])   . Chronic renal disease, stage III   . Coronary artery disease    Myocardial infarction in 2000 status post PCI and 2 stent placement to unknown arteries  . Diabetes mellitus without complication ([place])   . Dyslipidemia   . Hyperlipidemia   . Hypertension   . Hypothyroidism   . Macular degeneration    bilateral  . Metabolic syndrome   . MI (myocardial infarction)   . Peripheral neuropathy ([place])   . Peripheral vascular disease [facility])     Patient Active Problem List   Diagnosis Date Noted  . Coronary artery disease   . Hyperlipidemia   . Hypertension   . NONSPECIFIC ABN FINDING RAD & OTH EXAM GI TRACT 02/18/2009    Past Surgical History:  Procedure Laterality Date  . CARDIAC CATHETERIZATION  2000   [place] x2 stents  . CORONARY STENT PLACEMENT      Allergies Lidocaine and Statins  Social History Social History  Substance Use Topics  . Smoking status: Former Smoker    Years: 40.00    Types: Cigarettes    Quit date: 02/06/1999  . Smokeless tobacco: Never Used  . Alcohol use Yes     Comment: occasional    Review of Systems Constitutional: Negative for fever. Cardiovascular: Positive for chest pain Respiratory: Negative for  shortness of breath. Gastrointestinal: Negative for abdominal pain, vomiting and diarrhea. Genitourinary: Negative for dysuria. Musculoskeletal: Negative for back pain. Skin: Negative for rash. Neurological: Negative for headaches, focal weakness or numbness.  10-point ROS otherwise negative.  ____________________________________________   PHYSICAL EXAM:  VITAL SIGNS: ED Triage Vitals  Enc Vitals Group     BP 08/08/16 1840 (!) 178/91     Pulse Rate 08/08/16 1840 68     Resp 08/08/16 1840 16     Temp 08/08/16 1840 97.9 F (36.6 C)     Temp src --      SpO2 08/08/16 1840 94 %     Weight 08/08/16 1839 157 lb (71.2 kg)     Height 08/08/16 1839 5\' 8"  (1.727 m)     Head Circumference --      Peak Flow --      Pain Score 08/08/16 1840 4     Pain Loc --      Pain Edu? --      Excl. in [place]? --     Constitutional: Alert and oriented. Well appearing and in no distress. Eyes: Conjunctivae are normal. PERRL. Normal extraocular movements. ENT   Head: Normocephalic and atraumatic.   Nose: No congestion/rhinnorhea.   Mouth/Throat: Mucous membranes are moist.   Neck: No stridor. Cardiovascular: Normal rate, regular rhythm. No murmurs, rubs, or gallops. Respiratory: Normal respiratory effort without tachypnea nor retractions.  Breath sounds are clear and equal bilaterally. No wheezes/rales/rhonchi. Gastrointestinal: Soft and nontender. Normal bowel sounds Musculoskeletal: Nontender with normal range of motion in all extremities. No lower extremity tenderness nor edema. Neurologic:  Normal speech and language. No gross focal neurologic deficits are appreciated.  Skin:  Skin is warm, dry and intact. No rash noted. Psychiatric: Mood and affect are normal. Speech and behavior are normal.  ____________________________________________  EKG: Interpreted by me.Sinus rhythm with a rate of 60 bpm, first-degree AV block, normal QRS, normal QT, normal  axis.  ____________________________________________  ED COURSE:  Pertinent labs & imaging results that were available during my care of the patient were reviewed by me and considered in my medical decision making (see chart for details). Clinical Course   Presents to the ER for chest pain and is high risk for ACS. We will assess with labs and imaging. Patient will receive aspirin and nitroglycerin.  Procedures ____________________________________________   LABS (pertinent positives/negatives)  Labs Reviewed  BASIC METABOLIC PANEL - Abnormal; Notable for the following:       Result Value   BUN 27 (*)    Creatinine, Ser 1.27 (*)    GFR calc non Af Amer 53 (*)    All other components within normal limits  TROPONIN I - Abnormal; Notable for the following:    Troponin I 0.03 (*)    All other components within normal limits  CBC    RADIOLOGY  Chest x-ray IMPRESSION: Enlargement of cardiac silhouette.  Emphysematous and bronchitic changes likely reflecting COPD.  No acute infiltrate. ____________________________________________  FINAL ASSESSMENT AND PLAN  Chest pain  Plan: Patient with labs and imaging as dictated above. Patient is high risk for ACS, troponin slightly elevated at 0.03. Chest pain seems to be improved after nitroglycerin. Patient needs to have repeat troponins and likely stress test or heart catheterization. I will discuss with the hospitalist for admission.   Earleen Newport, MD   Note: This dictation was prepared with Dragon dictation. Any transcriptional errors that result from this process are unintentional    Earleen Newport, MD 08/08/16 2119

## 2016-08-09 ENCOUNTER — Other Ambulatory Visit: Payer: Self-pay | Admitting: *Deleted

## 2016-08-09 ENCOUNTER — Observation Stay (HOSPITAL_BASED_OUTPATIENT_CLINIC_OR_DEPARTMENT_OTHER)
Admit: 2016-08-09 | Discharge: 2016-08-09 | Disposition: A | Discharge status: Home / Self Care | Payer: Medicare Other | Attending: Physician Assistant | Admitting: Physician Assistant

## 2016-08-09 ENCOUNTER — Encounter
Admission: EM | Disposition: A | Discharge status: Other Acute Inpatient Hospital | Payer: Self-pay | Source: Home / Self Care | Attending: Emergency Medicine

## 2016-08-09 ENCOUNTER — Encounter (HOSPITAL_COMMUNITY): Payer: Self-pay | Admitting: Physician Assistant

## 2016-08-09 ENCOUNTER — Inpatient Hospital Stay (HOSPITAL_COMMUNITY)
Admission: AD | Admit: 2016-08-09 | Discharge: 2016-08-16 | DRG: 236 | Disposition: A | Discharge status: Home / Self Care | Payer: Medicare Other | Source: Other Acute Inpatient Hospital | Attending: Cardiothoracic Surgery | Admitting: Cardiothoracic Surgery

## 2016-08-09 DIAGNOSIS — I348 Other nonrheumatic mitral valve disorders: Secondary | ICD-10-CM | POA: Diagnosis not present

## 2016-08-09 DIAGNOSIS — Z79899 Other long term (current) drug therapy: Secondary | ICD-10-CM | POA: Diagnosis not present

## 2016-08-09 DIAGNOSIS — R918 Other nonspecific abnormal finding of lung field: Secondary | ICD-10-CM | POA: Diagnosis not present

## 2016-08-09 DIAGNOSIS — E785 Hyperlipidemia, unspecified: Secondary | ICD-10-CM | POA: Diagnosis present

## 2016-08-09 DIAGNOSIS — E1142 Type 2 diabetes mellitus with diabetic polyneuropathy: Secondary | ICD-10-CM | POA: Diagnosis present

## 2016-08-09 DIAGNOSIS — I10 Essential (primary) hypertension: Secondary | ICD-10-CM | POA: Diagnosis not present

## 2016-08-09 DIAGNOSIS — I214 Non-ST elevation (NSTEMI) myocardial infarction: Secondary | ICD-10-CM | POA: Diagnosis not present

## 2016-08-09 DIAGNOSIS — Z888 Allergy status to other drugs, medicaments and biological substances status: Secondary | ICD-10-CM

## 2016-08-09 DIAGNOSIS — R079 Chest pain, unspecified: Secondary | ICD-10-CM

## 2016-08-09 DIAGNOSIS — I25110 Atherosclerotic heart disease of native coronary artery with unstable angina pectoris: Secondary | ICD-10-CM | POA: Diagnosis not present

## 2016-08-09 DIAGNOSIS — I2510 Atherosclerotic heart disease of native coronary artery without angina pectoris: Secondary | ICD-10-CM | POA: Diagnosis present

## 2016-08-09 DIAGNOSIS — N183 Chronic kidney disease, stage 3 unspecified: Secondary | ICD-10-CM | POA: Diagnosis present

## 2016-08-09 DIAGNOSIS — Z955 Presence of coronary angioplasty implant and graft: Secondary | ICD-10-CM | POA: Diagnosis not present

## 2016-08-09 DIAGNOSIS — E1151 Type 2 diabetes mellitus with diabetic peripheral angiopathy without gangrene: Secondary | ICD-10-CM | POA: Diagnosis present

## 2016-08-09 DIAGNOSIS — Z884 Allergy status to anesthetic agent status: Secondary | ICD-10-CM

## 2016-08-09 DIAGNOSIS — Z7982 Long term (current) use of aspirin: Secondary | ICD-10-CM

## 2016-08-09 DIAGNOSIS — E782 Mixed hyperlipidemia: Secondary | ICD-10-CM | POA: Diagnosis not present

## 2016-08-09 DIAGNOSIS — Z87891 Personal history of nicotine dependence: Secondary | ICD-10-CM

## 2016-08-09 DIAGNOSIS — E8881 Metabolic syndrome: Secondary | ICD-10-CM | POA: Diagnosis present

## 2016-08-09 DIAGNOSIS — Z8249 Family history of ischemic heart disease and other diseases of the circulatory system: Secondary | ICD-10-CM | POA: Diagnosis not present

## 2016-08-09 DIAGNOSIS — I714 Abdominal aortic aneurysm, without rupture, unspecified: Secondary | ICD-10-CM | POA: Diagnosis present

## 2016-08-09 DIAGNOSIS — E039 Hypothyroidism, unspecified: Secondary | ICD-10-CM | POA: Diagnosis present

## 2016-08-09 DIAGNOSIS — I252 Old myocardial infarction: Secondary | ICD-10-CM | POA: Diagnosis not present

## 2016-08-09 DIAGNOSIS — H3530 Unspecified macular degeneration: Secondary | ICD-10-CM | POA: Diagnosis present

## 2016-08-09 DIAGNOSIS — Y831 Surgical operation with implant of artificial internal device as the cause of abnormal reaction of the patient, or of later complication, without mention of misadventure at the time of the procedure: Secondary | ICD-10-CM | POA: Diagnosis present

## 2016-08-09 DIAGNOSIS — I340 Nonrheumatic mitral (valve) insufficiency: Secondary | ICD-10-CM | POA: Diagnosis present

## 2016-08-09 DIAGNOSIS — J9811 Atelectasis: Secondary | ICD-10-CM | POA: Diagnosis not present

## 2016-08-09 DIAGNOSIS — T82855A Stenosis of coronary artery stent, initial encounter: Secondary | ICD-10-CM | POA: Diagnosis present

## 2016-08-09 DIAGNOSIS — E1122 Type 2 diabetes mellitus with diabetic chronic kidney disease: Secondary | ICD-10-CM | POA: Diagnosis present

## 2016-08-09 DIAGNOSIS — I4891 Unspecified atrial fibrillation: Secondary | ICD-10-CM | POA: Diagnosis present

## 2016-08-09 DIAGNOSIS — I712 Thoracic aortic aneurysm, without rupture, unspecified: Secondary | ICD-10-CM

## 2016-08-09 DIAGNOSIS — D62 Acute posthemorrhagic anemia: Secondary | ICD-10-CM | POA: Diagnosis not present

## 2016-08-09 DIAGNOSIS — Z01810 Encounter for preprocedural cardiovascular examination: Secondary | ICD-10-CM | POA: Diagnosis not present

## 2016-08-09 DIAGNOSIS — Z9689 Presence of other specified functional implants: Secondary | ICD-10-CM

## 2016-08-09 DIAGNOSIS — K219 Gastro-esophageal reflux disease without esophagitis: Secondary | ICD-10-CM | POA: Diagnosis present

## 2016-08-09 DIAGNOSIS — Z09 Encounter for follow-up examination after completed treatment for conditions other than malignant neoplasm: Secondary | ICD-10-CM

## 2016-08-09 DIAGNOSIS — I129 Hypertensive chronic kidney disease with stage 1 through stage 4 chronic kidney disease, or unspecified chronic kidney disease: Secondary | ICD-10-CM | POA: Diagnosis present

## 2016-08-09 DIAGNOSIS — E8770 Fluid overload, unspecified: Secondary | ICD-10-CM | POA: Diagnosis not present

## 2016-08-09 DIAGNOSIS — Z951 Presence of aortocoronary bypass graft: Secondary | ICD-10-CM

## 2016-08-09 DIAGNOSIS — J90 Pleural effusion, not elsewhere classified: Secondary | ICD-10-CM | POA: Diagnosis not present

## 2016-08-09 DIAGNOSIS — E119 Type 2 diabetes mellitus without complications: Secondary | ICD-10-CM

## 2016-08-09 DIAGNOSIS — Z4682 Encounter for fitting and adjustment of non-vascular catheter: Secondary | ICD-10-CM | POA: Diagnosis not present

## 2016-08-09 HISTORY — PX: CARDIAC CATHETERIZATION: SHX172

## 2016-08-09 LAB — ECHOCARDIOGRAM COMPLETE
Height: 68 in
Weight: 2456 oz

## 2016-08-09 LAB — CBC
HEMATOCRIT: 39.2 % — AB (ref 40.0–52.0)
HEMOGLOBIN: 13.3 g/dL (ref 13.0–18.0)
MCH: 30.8 pg (ref 26.0–34.0)
MCHC: 34 g/dL (ref 32.0–36.0)
MCV: 90.6 fL (ref 80.0–100.0)
Platelets: 219 10*3/uL (ref 150–440)
RBC: 4.33 MIL/uL — ABNORMAL LOW (ref 4.40–5.90)
RDW: 13.6 % (ref 11.5–14.5)
WBC: 6.9 10*3/uL (ref 3.8–10.6)

## 2016-08-09 LAB — TROPONIN I
Troponin I: 0.29 ng/mL (ref <0.03)
Troponin I: 0.33 ng/mL (ref <0.03)
Troponin I: 0.32 ng/mL (ref <0.03)

## 2016-08-09 LAB — LIPID PANEL
CHOL/HDL RATIO: 7.1 ratio
Cholesterol: 192 mg/dL (ref 0–200)
HDL: 27 mg/dL — AB (ref >40)
LDL Cholesterol: 111 mg/dL — ABNORMAL HIGH (ref 0–99)
Triglycerides: 268 mg/dL — ABNORMAL HIGH (ref <150)
VLDL: 54 mg/dL — AB (ref 0–40)

## 2016-08-09 LAB — TSH: TSH: 6.215 u[IU]/mL — AB (ref 0.350–4.500)

## 2016-08-09 LAB — APTT: APTT: 35 s (ref 24–36)

## 2016-08-09 LAB — PROTIME-INR
INR: 1.09
Prothrombin Time: 14.1 seconds (ref 11.4–15.2)

## 2016-08-09 LAB — HEPARIN LEVEL (UNFRACTIONATED): Heparin Unfractionated: 0.27 IU/mL — ABNORMAL LOW (ref 0.30–0.70)

## 2016-08-09 LAB — GLUCOSE, CAPILLARY: Glucose-Capillary: 95 mg/dL (ref 65–99)

## 2016-08-09 SURGERY — LEFT HEART CATH AND CORONARY ANGIOGRAPHY
Anesthesia: Moderate Sedation

## 2016-08-09 MED ORDER — ASPIRIN 81 MG PO CHEW: 81.0000 mg | CHEWABLE_TABLET | Freq: Every day | ORAL | Status: DC

## 2016-08-09 MED ORDER — HEPARIN (PORCINE) IN NACL 100-0.45 UNIT/ML-% IJ SOLN
1350.0000 [IU]/h | INTRAMUSCULAR | Status: DC
  Administered 2016-08-09: 850 [IU]/h via INTRAVENOUS
  Administered 2016-08-11: 1200 [IU]/h via INTRAVENOUS
  Filled 2016-08-09 (×2): qty 250

## 2016-08-09 MED ORDER — HEPARIN SODIUM (PORCINE) 5000 UNIT/ML IJ SOLN: 5000.0000 [IU] | Freq: Three times a day (TID) | INTRAMUSCULAR | Status: DC

## 2016-08-09 MED ORDER — ACETAMINOPHEN 325 MG PO TABS: 650.0000 mg | ORAL_TABLET | ORAL | Status: DC | PRN

## 2016-08-09 MED ORDER — SODIUM CHLORIDE 0.9% FLUSH: 3.0000 mL | Freq: Two times a day (BID) | INTRAVENOUS | Status: DC

## 2016-08-09 MED ORDER — OCUVITE-LUTEIN PO CAPS: 1.0000 | ORAL_CAPSULE | Freq: Every day | ORAL | Status: DC

## 2016-08-09 MED ORDER — GABAPENTIN 600 MG PO TABS
600.0000 mg | ORAL_TABLET | Freq: Three times a day (TID) | ORAL | Status: DC
  Administered 2016-08-09 – 2016-08-10 (×5): 600 mg via ORAL
  Filled 2016-08-09 (×5): qty 1

## 2016-08-09 MED ORDER — BUPIVACAINE HCL (PF) 0.5 % IJ SOLN
INTRAMUSCULAR | Status: AC
  Filled 2016-08-09: qty 30

## 2016-08-09 MED ORDER — IOPAMIDOL (ISOVUE-300) INJECTION 61%
INTRAVENOUS | Status: DC | PRN
  Administered 2016-08-09: 50 mL via INTRA_ARTERIAL

## 2016-08-09 MED ORDER — ONDANSETRON HCL 4 MG/2ML IJ SOLN: 4.0000 mg | Freq: Four times a day (QID) | INTRAMUSCULAR | Status: DC | PRN

## 2016-08-09 MED ORDER — SODIUM CHLORIDE 0.9% FLUSH: 3.0000 mL | INTRAVENOUS | Status: DC | PRN

## 2016-08-09 MED ORDER — MORPHINE SULFATE (PF) 4 MG/ML IV SOLN: 2.0000 mg | INTRAVENOUS | 0 refills | Status: DC | PRN

## 2016-08-09 MED ORDER — SODIUM CHLORIDE 0.9 % IV SOLN
INTRAVENOUS | Status: DC
  Administered 2016-08-09: 10:00:00 via INTRAVENOUS

## 2016-08-09 MED ORDER — HEPARIN SODIUM (PORCINE) 1000 UNIT/ML IJ SOLN
INTRAMUSCULAR | Status: DC
Start: 2016-08-09 — End: 2016-08-09
  Filled 2016-08-09: qty 1

## 2016-08-09 MED ORDER — METOPROLOL TARTRATE 25 MG PO TABS: 25.0000 mg | ORAL_TABLET | Freq: Two times a day (BID) | ORAL | Status: DC

## 2016-08-09 MED ORDER — VITAMIN E 45 MG (100 UNIT) PO CAPS: 100.0000 [IU] | ORAL_CAPSULE | Freq: Every day | ORAL | Status: DC

## 2016-08-09 MED ORDER — SODIUM CHLORIDE 0.9 % WEIGHT BASED INFUSION: 1.0000 mL/kg/h | INTRAVENOUS | Status: DC

## 2016-08-09 MED ORDER — HEPARIN SODIUM (PORCINE) 1000 UNIT/ML IJ SOLN
INTRAMUSCULAR | Status: DC | PRN
  Administered 2016-08-09: 4000 [IU] via INTRAVENOUS

## 2016-08-09 MED ORDER — RAMIPRIL 10 MG PO CAPS
10.0000 mg | ORAL_CAPSULE | Freq: Every day | ORAL | Status: DC
  Filled 2016-08-09: qty 1

## 2016-08-09 MED ORDER — EZETIMIBE 10 MG PO TABS
10.0000 mg | ORAL_TABLET | Freq: Every day | ORAL | Status: DC
  Administered 2016-08-09 – 2016-08-10 (×2): 10 mg via ORAL
  Filled 2016-08-09 (×2): qty 1

## 2016-08-09 MED ORDER — MORPHINE SULFATE (PF) 4 MG/ML IV SOLN: 2.0000 mg | INTRAVENOUS | Status: DC | PRN

## 2016-08-09 MED ORDER — ASPIRIN EC 81 MG PO TBEC
81.0000 mg | DELAYED_RELEASE_TABLET | Freq: Every day | ORAL | Status: DC
  Administered 2016-08-10: 81 mg via ORAL
  Filled 2016-08-09: qty 1

## 2016-08-09 MED ORDER — SODIUM CHLORIDE 0.9 % IV SOLN: INTRAVENOUS | Status: DC

## 2016-08-09 MED ORDER — GABAPENTIN 300 MG PO CAPS: 600.0000 mg | ORAL_CAPSULE | Freq: Three times a day (TID) | ORAL | Status: DC

## 2016-08-09 MED ORDER — PANTOPRAZOLE SODIUM 40 MG PO TBEC
40.0000 mg | DELAYED_RELEASE_TABLET | Freq: Every day | ORAL | Status: DC
  Administered 2016-08-09 – 2016-08-10 (×2): 40 mg via ORAL
  Filled 2016-08-09 (×2): qty 1

## 2016-08-09 MED ORDER — PANTOPRAZOLE SODIUM 40 MG PO TBEC: 40.0000 mg | DELAYED_RELEASE_TABLET | Freq: Every day | ORAL | Status: DC

## 2016-08-09 MED ORDER — OCUVITE-LUTEIN PO CAPS
1.0000 | ORAL_CAPSULE | Freq: Every day | ORAL | Status: DC
  Administered 2016-08-09: 1 via ORAL
  Filled 2016-08-09 (×2): qty 1

## 2016-08-09 MED ORDER — NITROGLYCERIN 0.4 MG SL SUBL
0.4000 mg | SUBLINGUAL_TABLET | SUBLINGUAL | Status: DC | PRN
Start: 2016-08-09 — End: 2016-08-11

## 2016-08-09 MED ORDER — SODIUM CHLORIDE 0.9 % WEIGHT BASED INFUSION: 3.0000 mL/kg/h | INTRAVENOUS | Status: DC

## 2016-08-09 MED ORDER — SODIUM CHLORIDE 0.9 % IV SOLN: 250.0000 mL | INTRAVENOUS | Status: DC | PRN

## 2016-08-09 MED ORDER — METOPROLOL TARTRATE 25 MG PO TABS
25.0000 mg | ORAL_TABLET | Freq: Two times a day (BID) | ORAL | Status: DC
  Administered 2016-08-09 – 2016-08-10 (×3): 25 mg via ORAL
  Filled 2016-08-09 (×3): qty 1

## 2016-08-09 MED ORDER — ALPRAZOLAM 0.25 MG PO TABS: 0.2500 mg | ORAL_TABLET | Freq: Two times a day (BID) | ORAL | Status: DC | PRN

## 2016-08-09 MED ORDER — EZETIMIBE 10 MG PO TABS: 10.0000 mg | ORAL_TABLET | Freq: Every day | ORAL | Status: DC

## 2016-08-09 MED ORDER — FENTANYL CITRATE (PF) 100 MCG/2ML IJ SOLN
INTRAMUSCULAR | Status: AC
  Filled 2016-08-09: qty 2

## 2016-08-09 MED ORDER — VERAPAMIL HCL 2.5 MG/ML IV SOLN
INTRAVENOUS | Status: AC
  Filled 2016-08-09: qty 2

## 2016-08-09 MED ORDER — HEPARIN (PORCINE) IN NACL 2-0.9 UNIT/ML-% IJ SOLN
INTRAMUSCULAR | Status: AC
  Filled 2016-08-09: qty 500

## 2016-08-09 MED ORDER — MIDAZOLAM HCL 2 MG/2ML IJ SOLN
INTRAMUSCULAR | Status: AC
  Filled 2016-08-09: qty 2

## 2016-08-09 MED ORDER — HEPARIN (PORCINE) IN NACL 100-0.45 UNIT/ML-% IJ SOLN: 850.0000 [IU]/h | INTRAMUSCULAR | Status: DC

## 2016-08-09 MED ORDER — PNEUMOCOCCAL VAC POLYVALENT 25 MCG/0.5ML IJ INJ: 0.5000 mL | INJECTION | INTRAMUSCULAR | Status: DC

## 2016-08-09 MED ORDER — VERAPAMIL HCL 2.5 MG/ML IV SOLN
INTRAVENOUS | Status: DC | PRN
  Administered 2016-08-09: 2.5 mg via INTRA_ARTERIAL

## 2016-08-09 MED ORDER — FENOFIBRATE 160 MG PO TABS: 160.0000 mg | ORAL_TABLET | Freq: Every day | ORAL | Status: DC

## 2016-08-09 MED ORDER — RAMIPRIL 10 MG PO CAPS: 10.0000 mg | ORAL_CAPSULE | Freq: Every day | ORAL | Status: DC

## 2016-08-09 MED ORDER — HEPARIN (PORCINE) IN NACL 100-0.45 UNIT/ML-% IJ SOLN
850.0000 [IU]/h | INTRAMUSCULAR | Status: DC
  Administered 2016-08-09: 850 [IU]/h via INTRAVENOUS
  Filled 2016-08-09: qty 250

## 2016-08-09 MED ORDER — FENOFIBRATE 160 MG PO TABS
160.0000 mg | ORAL_TABLET | Freq: Every day | ORAL | Status: DC
  Administered 2016-08-09 – 2016-08-10 (×2): 160 mg via ORAL
  Filled 2016-08-09 (×2): qty 1

## 2016-08-09 MED ORDER — VITAMIN E 45 MG (100 UNIT) PO CAPS
100.0000 [IU] | ORAL_CAPSULE | Freq: Every day | ORAL | Status: DC
  Administered 2016-08-09 – 2016-08-10 (×2): 100 [IU] via ORAL
  Filled 2016-08-09 (×3): qty 1

## 2016-08-09 MED ORDER — ZOLPIDEM TARTRATE 5 MG PO TABS: 5.0000 mg | ORAL_TABLET | Freq: Every evening | ORAL | Status: DC | PRN

## 2016-08-09 SURGICAL SUPPLY — 7 items
CATH OPTITORQUE JACKY 4.0 5F (CATHETERS) ×3 IMPLANT
DEVICE RAD TR BAND REGULAR (VASCULAR PRODUCTS) ×3 IMPLANT
GLIDESHEATH SLEND SS 6F .021 (SHEATH) ×3 IMPLANT
KIT MANI 3VAL PERCEP (MISCELLANEOUS) ×3 IMPLANT
PACK CARDIAC CATH (CUSTOM PROCEDURE TRAY) ×3 IMPLANT
WIRE HITORQ VERSACORE ST 145CM (WIRE) ×3 IMPLANT
WIRE ROSEN-J .035X260CM (WIRE) ×3 IMPLANT

## 2016-08-09 NOTE — Progress Notes (Signed)
Pt arrived from ED alert and oriented. Skin verified with Suzzanne Cloud , no issues. Telemetry box verified with Estill Bamberg NT . Pt oriented to staff and equipment. No c/o pain, no SOB. No concerns offered.

## 2016-08-09 NOTE — ED Notes (Signed)
Report given to floor RN for bed 232

## 2016-08-09 NOTE — Progress Notes (Signed)
ANTICOAGULATION CONSULT NOTE - Follow Up Consult  Pharmacy Consult for heparin drip Indication: ACS/STEMI  Allergies  Allergen Reactions  . Lidocaine Anaphylaxis    novacaine and lidocaine  . Statins     GI symptoms on almost all statins    Patient Measurements: Height: 5\' 8"  (172.7 cm) Weight: 154 lb 1.6 oz (69.9 kg) IBW/kg (Calculated) : 68.4 Heparin Dosing Weight: 70kg  Vital Signs: Temp: 98.7 F (37.1 C) (12/11 1643) Temp Source: Oral (12/11 1643) BP: 156/75 (12/11 1736) Pulse Rate: 68 (12/11 1713)  Labs:  Recent Labs  08/08/16 1842 08/09/16 0123 08/09/16 0304 08/09/16 0420 08/09/16 0719 08/09/16 1255  HGB 14.2  --   --   --   --  13.3  HCT 42.3  --   --   --   --  39.2*  PLT 230  --   --   --   --  219  APTT  --   --  35  --   --   --   LABPROT  --   --  14.1  --   --   --   INR  --   --  1.09  --   --   --   HEPARINUNFRC  --   --   --   --   --  0.27*  CREATININE 1.27*  --   --   --   --   --   TROPONINI 0.03* 0.29*  --  0.33* 0.32*  --     Estimated Creatinine Clearance: 47.1 mL/min (by C-G formula based on SCr of 1.27 mg/dL (H)).   Medical History: Past Medical History:  Diagnosis Date  . AAA (abdominal aortic aneurysm) ([place])   . Chronic renal disease, stage III   . Coronary artery disease    a. MI 2000 s/p PCI and 2 stent placement to unknown arteries; b. Myoview negative; c. LHC 08/09/2016 o-pLAD 30%, mLAD 99%, OM2 50%, OM3 85%, pRCA 99%, mid RCA 60%, RPDA 60%, LVEDP mod elevated  . Diabetes mellitus without complication ([place])   . Dyslipidemia   . Hyperlipidemia   . Hypertension   . Hypothyroidism   . Macular degeneration    bilateral  . Metabolic syndrome   . MI (myocardial infarction)   . Peripheral neuropathy ([place])   . Peripheral vascular disease ([place])      Assessment: 75yom admitted with ACS.  Patient received 1x dose of Lovenox 70mg  in ED last pm and started heparin drip rate 850 units/hr early this am.  HL 0.27 then heparin off  for cath.  Found to have 3V CAD with instent stenosis of RCA and LAD, EF 55% - plan CABG. Restart heaprin drip 8hr after sheath out - out 1430 start heparin 2300 tonight.   Goal of Therapy:  Heparin level 0.3-0.7 units/ml Monitor platelets by anticoagulation protocol: Yes   Plan:  No bolus - post cath Heparin drip 850 uts/hr Daily HL, CBC  Bonnita Nasuti Pharm.D. CPP, BCPS Clinical Pharmacist [phone] 08/09/2016 6:20 PM

## 2016-08-09 NOTE — Progress Notes (Signed)
MD aware of new troponin levels. Pt already on heparin drip per Md. Will continue to monitor.

## 2016-08-09 NOTE — Progress Notes (Signed)
[facility] arrived to transport patient to [facility]. Patient reporting no pain throughout recovery. TR band deflated to 88ml, report given to [facility].

## 2016-08-09 NOTE — Consult Note (Signed)
Cardiology Consultation Note  Patient ID: Thomas Fuentes, MRN: [id number], DOB/AGE: [birth year] 75 y.o. Admit date: 08/08/2016   Date of Consult: 08/09/2016 Primary Physician: Marton Redwood, MD Primary Cardiologist: Dr. Fletcher Anon, MD Requesting Physician: Dr. Ara Kussmaul, DO  Chief Complaint: Chest pain Reason for Consult: NSTEMI  HPI: 75 y.o. male with h/o CAD s/p MI and PCI/stenting in 2000 without records for review, CKD stage II-III, HTN, HLD, AAA measuring 3.2 cm in 2015, and hypothyroidism who presented to [name] on 12/10 with intermittent substernal chest pain c/w prior MI and ruled in with a current troponin of 0.33.  Patient has known history of CAD s/p MI in 2000 undergoing PCI and stenting at that time without records available for review. He last underwent ischemic evaluation in 2015 via nuclear stress test that was without evidence of ischemia. He has been doing well since he was last seen in the clinic in 10/2015. He worked hard over the summer, cutting down numerous trees without issue. He was in his usual state of health until the afternoon of 12/10 when he developed substernal chest pain/pressure that was intermittent. It was associated with burping. Pain worse when sitting up, leaning forward. Nothing made the pain worse and it would improve on its own. No associated SOB, nausea, vomiting, diaphoresis, dizziness, presyncope, or syncope. He checked his BP at home and found this to be 99991111 systolic. Because of his pain and elevated BP he presented to [name].   Upon the patient's arrival to [name] they were found to have troponin 0.03-->0.29-->0.33. He was started on a heparin gtt. He received full-dose ASA in the ED. SCr 1.27 (baseline 1.5), K+ 4.0, wbc 7.6, hgb 14.2, plt 230, tsh elevated at 6.215. ECG showed NSR, 68 bpm, no acute st/t changes, CXR showed no acute infiltrate. Currently chest pain free.     Past Medical History:  Diagnosis Date  . AAA (abdominal aortic aneurysm) ([name])   .  Chronic renal disease, stage III   . Coronary artery disease    Myocardial infarction in 2000 status post PCI and 2 stent placement to unknown arteries  . Diabetes mellitus without complication ([name])   . Dyslipidemia   . Hyperlipidemia   . Hypertension   . Hypothyroidism   . Macular degeneration    bilateral  . Metabolic syndrome   . MI (myocardial infarction)   . Peripheral neuropathy ([name])   . Peripheral vascular disease ([place])       Most Recent Cardiac Studies: As above   Surgical History:  Past Surgical History:  Procedure Laterality Date  . CARDIAC CATHETERIZATION  2000   [place] x2 stents  . CORONARY STENT PLACEMENT       Home Meds: Prior to Admission medications   Medication Sig Start Date End Date Taking? Authorizing Provider  aspirin 81 MG tablet Take 81 mg by mouth daily.   Yes Historical Provider, MD  beta carotene w/minerals (OCUVITE) tablet Take 1 tablet by mouth daily.   Yes Historical Provider, MD  fenofibrate 160 MG tablet Take 160 mg by mouth daily.   Yes Historical Provider, MD  gabapentin (NEURONTIN) 300 MG capsule Take 600 mg by mouth 3 (three) times daily.   Yes Historical Provider, MD  pantoprazole (PROTONIX) 40 MG tablet Take 40 mg by mouth daily.   Yes Historical Provider, MD  ramipril (ALTACE) 10 MG capsule Take 10 mg by mouth daily.    Yes Historical Provider, MD  ezetimibe (ZETIA) 10 MG tablet Take 10 mg by  mouth daily.    Historical Provider, MD  vitamin E 100 UNIT capsule Take 100 Units by mouth daily.    Historical Provider, MD    Inpatient Medications:  . aspirin  81 mg Oral Daily  . enoxaparin      . ezetimibe  10 mg Oral Daily  . fenofibrate  160 mg Oral Daily  . gabapentin  600 mg Oral TID  . metoprolol tartrate  25 mg Oral BID  . multivitamin-lutein  1 capsule Oral Daily  . pantoprazole  40 mg Oral Daily  . [START ON 08/10/2016] pneumococcal 23 valent vaccine  0.5 mL Intramuscular Tomorrow-1000  . ramipril  10 mg Oral Daily  . vitamin  E  100 Units Oral Daily   . sodium chloride    . heparin 850 Units/hr (08/09/16 0425)    Allergies:  Allergies  Allergen Reactions  . Lidocaine Anaphylaxis    novacaine and lidocaine  . Statins     GI symptoms on almost all statins    Social History   Social History  . Marital status: Widowed    Spouse name: N/A  . Number of children: N/A  . Years of education: N/A   Occupational History  . Not on file.   Social History Main Topics  . Smoking status: Former Smoker    Years: 40.00    Types: Cigarettes    Quit date: 02/06/1999  . Smokeless tobacco: Never Used  . Alcohol use Yes     Comment: occasional  . Drug use: No  . Sexual activity: Not on file   Other Topics Concern  . Not on file   Social History Narrative  . No narrative on file     Family History  Problem Relation Age of Onset  . Hypertension Mother      Review of Systems: Review of Systems  Constitutional: Positive for malaise/fatigue. Negative for chills, diaphoresis, fever and weight loss.  HENT: Negative for congestion.   Eyes: Negative for discharge and redness.  Respiratory: Negative for cough, hemoptysis, sputum production, shortness of breath and wheezing.   Cardiovascular: Positive for chest pain. Negative for palpitations, orthopnea, claudication, leg swelling and PND.  Gastrointestinal: Negative for abdominal pain, blood in stool, heartburn, melena, nausea and vomiting.       Burping  Genitourinary: Negative for hematuria.  Musculoskeletal: Negative for falls and myalgias.  Skin: Negative for rash.  Neurological: Negative for dizziness, tingling, tremors, sensory change, speech change, focal weakness, loss of consciousness and weakness.  Endo/Heme/Allergies: Does not bruise/bleed easily.  Psychiatric/Behavioral: Negative for substance abuse. The patient is not nervous/anxious.   All other systems reviewed and are negative.   Labs:  Recent Labs  08/08/16 1842 08/09/16 0123  08/09/16 0420  TROPONINI 0.03* 0.29* 0.33*   Lab Results  Component Value Date   WBC 7.6 08/08/2016   HGB 14.2 08/08/2016   HCT 42.3 08/08/2016   MCV 91.9 08/08/2016   PLT 230 08/08/2016     Recent Labs Lab 08/08/16 1842  NA 139  K 4.0  CL 104  CO2 28  BUN 27*  CREATININE 1.27*  CALCIUM 9.8  GLUCOSE 87   No results found for: CHOL, HDL, LDLCALC, TRIG No results found for: DDIMER  Radiology/Studies:  Dg Chest 2 View  Result Date: 08/08/2016 CLINICAL DATA:  Sternal chest pain beginning mid date, history coronary disease post MI and stenting, former smoker, diabetes mellitus, abdominal aortic aneurysm, hypertension EXAM: CHEST  2 VIEW COMPARISON:  01/18/2014 FINDINGS:  Enlargement of cardiac silhouette. Mediastinal contours and pulmonary vascularity normal. Bronchitic changes without infiltrate, pleural effusion or pneumothorax. Underlying emphysematous changes. Bones appear demineralized. IMPRESSION: Enlargement of cardiac silhouette. Emphysematous and bronchitic changes likely reflecting COPD. No acute infiltrate. Electronically Signed   By: Lavonia Dana M.D.   On: 08/08/2016 19:10    EKG: Interpreted by me showed: NSR, 68 bpm, no acute st/t changes Telemetry: Interpreted by me showed: NSR, 60's bpm  Weights: Filed Weights   08/08/16 1839 08/09/16 0059  Weight: 157 lb (71.2 kg) 153 lb 8 oz (69.6 kg)     Physical Exam: Blood pressure (!) 101/55, pulse (!) 58, temperature 98.3 F (36.8 C), temperature source Oral, resp. rate 18, height 5\' 8"  (1.727 m), weight 153 lb 8 oz (69.6 kg), SpO2 94 %. Body mass index is 23.34 kg/m. General: Well developed, well nourished, in no acute distress. Head: Normocephalic, atraumatic, sclera non-icteric, no xanthomas, nares are without discharge.  Neck: Negative for carotid bruits. JVD not elevated. Lungs: Clear bilaterally to auscultation without wheezes, rales, or rhonchi. Breathing is unlabored. Heart: RRR with S1 S2. No murmurs,  rubs, or gallops appreciated. Abdomen: Soft, non-tender, non-distended with normoactive bowel sounds. No hepatomegaly. No rebound/guarding. No obvious abdominal masses. Msk:  Strength and tone appear normal for age. Extremities: No clubbing or cyanosis. No edema. Distal pedal pulses are 2+ and equal bilaterally. Neuro: Alert and oriented X 3. No facial asymmetry. No focal deficit. Moves all extremities spontaneously. Psych:  Responds to questions appropriately with a normal affect.    Assessment and Plan:  Principal Problem:   NSTEMI (non-ST elevated myocardial infarction) ([place]) Active Problems:   Coronary artery disease   CKD (chronic kidney disease), stage III   Hyperlipidemia   Hypertension   AAA (abdominal aortic aneurysm) ([facility])   Hypothyroidism    1. NSTEMI: -Currently chest pain free -Continue to cycle troponin until peak -Scheduled for LHC with Dr. Fletcher Anon, at 11:30 AM -Continue heparin gtt -Check stat echo prior to cardiac cath to assess EF given his CKD in an effort to avoid LV gram  -Hydrate with NS this morning at 100 mL/hour given CKD -Limit IV contrast -ASA -Continue beta blocker -Check lipid panel and A1C for further risk assessment -Risks and benefits of cardiac catheterization have been discussed with the patient including risks of bleeding, bruising, infection, kidney damage, stroke, heart attack, and death. The patient understands these risks and is willing to proceed with the procedure. All questions have been answered and concerns listened to  2. CKD stage III: -Hydrate and limit IV contrast as above -Monitor post-cath for contrast induced nephropathy   3. HTN: -Improved -Monitor  4. HLD: -Intolerant to statins -Continue Zetia -Check lipid panel  5. Reported DM: -Patient denies this -Check A1C as above  6. Hypothyroidism: -Per IM   Signed, Christell Faith, PA-C [facility] Pager: [phone] 08/09/2016, 8:26 AM

## 2016-08-09 NOTE — Progress Notes (Signed)
Pt heparin gtt started  At 8.80ml per hr ( 850units per hr ) . No sign of bleeding noted . Pt advised  To notify nurse if any signs of obvious bleeding is noted . He is aox4 and vebalize understanding . Will continue  To moniotor . -

## 2016-08-09 NOTE — Progress Notes (Signed)
ANTICOAGULATION CONSULT NOTE - Initial Consult  Pharmacy Consult for heparin drip Indication: ACS/STEMI  Allergies  Allergen Reactions  . Lidocaine Anaphylaxis    novacaine and lidocaine  . Statins     GI symptoms on almost all statins    Patient Measurements: Height: 5\' 8"  (172.7 cm) Weight: 153 lb 8 oz (69.6 kg) IBW/kg (Calculated) : 68.4 Heparin Dosing Weight: 70kg  Vital Signs: Temp: 97.5 F (36.4 C) (12/11 0059) Temp Source: Oral (12/11 0059) BP: 120/76 (12/11 0059) Pulse Rate: 58 (12/11 0059)  Labs:  Recent Labs  08/08/16 1842 08/09/16 0123 08/09/16 0304  HGB 14.2  --   --   HCT 42.3  --   --   PLT 230  --   --   APTT  --   --  35  LABPROT  --   --  14.1  INR  --   --  1.09  CREATININE 1.27*  --   --   TROPONINI 0.03* 0.29*  --     Estimated Creatinine Clearance: 47.1 mL/min (by C-G formula based on SCr of 1.27 mg/dL (H)).   Medical History: Past Medical History:  Diagnosis Date  . AAA (abdominal aortic aneurysm) ([place])   . Chronic renal disease, stage III   . Coronary artery disease    Myocardial infarction in 2000 status post PCI and 2 stent placement to unknown arteries  . Diabetes mellitus without complication ([place])   . Dyslipidemia   . Hyperlipidemia   . Hypertension   . Hypothyroidism   . Macular degeneration    bilateral  . Metabolic syndrome   . MI (myocardial infarction)   . Peripheral neuropathy ([place])   . Peripheral vascular disease ([facility])     Medications:  No anticoagulation in PTA meds  Assessment:  Goal of Therapy:  Heparin level 0.3-0.7 units/ml Monitor platelets by anticoagulation protocol: Yes   Plan:  Patient received 1x dose of 1 mg/kg Lovenox in ED so will hold initial bolus. Starting drip rate 850 units/hr. First heparin level 8 hours after start of infusion.  Demitra Danley S 08/09/2016,3:48 AM

## 2016-08-09 NOTE — H&P (Signed)
History and Physical  Patient ID: Thomas Fuentes MRN: [id number], DOB: 08/28/1941 Admit Date: (Not on file) Date of Encounter: 08/09/2016, 3:11 PM Primary Physician: Marton Redwood, MD Primary Cardiologist: Dr. Fletcher Anon, MD  Chief Complaint: Chest pain Reason for Admission: NSTEMI with cardiac cath showing severe 3-vessel CAD  HPI: 75 y.o. male with h/o CAD s/p MI and PCI/stenting in 2000 without records for review, CKD stage II-III, HTN, HLD, AAA measuring 3.2 cm in 2015, and hypothyroidism who presented to [facility] on 12/10 with intermittent substernal chest pain c/w prior MI and ruled in with a current troponin of 0.33. Patient has known history of CAD s/p MI in 2000 undergoing PCI and stenting at that time without records available for review. He last underwent ischemic evaluation in 2015 via nuclear stress test that was without evidence of ischemia. He has been doing well since he was last seen in the clinic in 10/2015. He worked hard over the summer, cutting down numerous trees without issue. He was in his usual state of health until the afternoon of 12/10 when he developed substernal chest pain/pressure that was intermittent. It was associated with burping. Pain worse when sitting up, leaning forward. Nothing made the pain worse and it would improve on its own. No associated SOB, nausea, vomiting, diaphoresis, dizziness, presyncope, or syncope. He checked his BP at home and found this to be 99991111 systolic. Because of his pain and elevated BP he presented to [facility]. Upon the patient's arrival to [facility] they were found to have troponin 0.03-->0.29-->0.33. He was started on a heparin gtt. He received full-dose ASA in the ED. SCr 1.27 (baseline 1.5), K+ 4.0, wbc 7.6, hgb 14.2, plt 230, tsh elevated at 6.215. ECG showed NSR, 68 bpm, no acute st/t changes, CXR showed no acute infiltrate. At time of cardiology consult he was chest pain free. He underwent echo prior to cardiac cath to evaluate EF given his CKD that  showed an EF of 50-55%, no RWMA, LV diastolic function was normal, aortic root 42 mm, mild MR. He was hydrated with IV fluids prior to cardiac cath. LHC showed severe 3-vessel CAD with severe ISR in both the LAD and RCA. LV gram was not performed 2/2 CKD. LVEDP was moderately elevated. It was recommended he be transferred to [facility] for evaluation of possible cardiac bypass surgery.   Past Medical History:  Diagnosis Date  . AAA (abdominal aortic aneurysm) ([place])   . Chronic renal disease, stage III   . Coronary artery disease    a. MI 2000 s/p PCI and 2 stent placement to unknown arteries; b. Myoview negative; c. LHC 08/09/2016 o-pLAD 30%, mLAD 99%, OM2 50%, OM3 85%, pRCA 99%, mid RCA 60%, RPDA 60%, LVEDP mod elevated  . Diabetes mellitus without complication ([place])   . Dyslipidemia   . Hyperlipidemia   . Hypertension   . Hypothyroidism   . Macular degeneration    bilateral  . Metabolic syndrome   . MI (myocardial infarction)   . Peripheral neuropathy ([place])   . Peripheral vascular disease ([place])      Most Recent Cardiac Studies: LHC 08/09/2016: Conclusion     There is no aortic valve stenosis.  Prox RCA lesion, 99 %stenosed.  Mid RCA lesion, 60 %stenosed.  RPDA lesion, 60 %stenosed.  Ost 2nd Mrg to 2nd Mrg lesion, 50 %stenosed.  Ost 3rd Mrg lesion, 85 %stenosed.  Ost LAD to Prox LAD lesion, 30 %stenosed.  Mid LAD to Dist LAD lesion, 99 %stenosed.  1. Severe three-vessel coronary artery disease with severe in-stent restenosis in both LAD and RCA. 2. Left ventricular angiography was not performed due to chronic kidney disease. EF was 50% by echo. LVEDP was moderately elevated.  Recommendations: I recommend CABG for revascularization. Resume heparin 8 hours after sheath pull. We'll transfer the patient to [facility] for evaluation.   Echo 08/09/2016: Study Conclusions  - Left ventricle: The cavity size was normal. There was mild   concentric hypertrophy.  Systolic function was normal. The   estimated ejection fraction was in the range of 50% to 55%. Wall   motion was normal; there were no regional wall motion   abnormalities. Left ventricular diastolic function parameters   were normal. - Aortic valve: Valve area (Vmax): 3.15 cm^2. - Aorta: Aortic root dimension: 42 mm (ED). - Aortic root: The aortic root was mildly dilated. - Mitral valve: There was mild regurgitation.   Surgical History:  Past Surgical History:  Procedure Laterality Date  . CARDIAC CATHETERIZATION  2000   [place] x2 stents  . CORONARY STENT PLACEMENT       Home Meds: Prior to Admission medications   Medication Sig Start Date End Date Taking? Authorizing Provider  aspirin 81 MG tablet Take 81 mg by mouth daily.    Historical Provider, MD  beta carotene w/minerals (OCUVITE) tablet Take 1 tablet by mouth daily.    Historical Provider, MD  ezetimibe (ZETIA) 10 MG tablet Take 10 mg by mouth daily.    Historical Provider, MD  fenofibrate 160 MG tablet Take 160 mg by mouth daily.    Historical Provider, MD  gabapentin (NEURONTIN) 300 MG capsule Take 600 mg by mouth 3 (three) times daily.    Historical Provider, MD  pantoprazole (PROTONIX) 40 MG tablet Take 40 mg by mouth daily.    Historical Provider, MD  ramipril (ALTACE) 10 MG capsule Take 10 mg by mouth daily.     Historical Provider, MD  vitamin E 100 UNIT capsule Take 100 Units by mouth daily.    Historical Provider, MD    Allergies:  Allergies  Allergen Reactions  . Lidocaine Anaphylaxis    novacaine and lidocaine  . Statins     GI symptoms on almost all statins    Social History   Social History  . Marital status: Widowed    Spouse name: N/A  . Number of children: N/A  . Years of education: N/A   Occupational History  . Not on file.   Social History Main Topics  . Smoking status: Former Smoker    Years: 40.00    Types: Cigarettes    Quit date: 02/06/1999  . Smokeless tobacco: Never Used  . Alcohol  use Yes     Comment: occasional  . Drug use: No  . Sexual activity: Not on file   Other Topics Concern  . Not on file   Social History Narrative  . No narrative on file     Family History  Problem Relation Age of Onset  . Hypertension Mother     Review of Systems: Review of Systems  Constitutional: Positive for malaise/fatigue. Negative for chills, diaphoresis, fever and weight loss.  HENT: Negative for congestion.   Eyes: Negative for discharge and redness.  Respiratory: Negative for cough, hemoptysis, sputum production, shortness of breath and wheezing.   Cardiovascular: Positive for chest pain. Negative for palpitations, orthopnea, claudication, leg swelling and PND.  Gastrointestinal: Negative for abdominal pain, blood in stool, heartburn, melena, nausea and  vomiting.       Burping  Genitourinary: Negative for hematuria.  Musculoskeletal: Negative for falls and myalgias.  Skin: Negative for rash.  Neurological: Negative for dizziness, tingling, tremors, sensory change, speech change, focal weakness, loss of consciousness and weakness.  Endo/Heme/Allergies: Does not bruise/bleed easily.  Psychiatric/Behavioral: Negative for substance abuse. The patient is not nervous/anxious.   All other systems reviewed and are negative.   Labs:   Lab Results  Component Value Date   WBC 6.9 08/09/2016   HGB 13.3 08/09/2016   HCT 39.2 (L) 08/09/2016   MCV 90.6 08/09/2016   PLT 219 08/09/2016     Recent Labs Lab 08/08/16 1842  NA 139  K 4.0  CL 104  CO2 28  BUN 27*  CREATININE 1.27*  CALCIUM 9.8  GLUCOSE 87    Recent Labs  08/08/16 1842 08/09/16 0123 08/09/16 0420 08/09/16 0719  TROPONINI 0.03* 0.29* 0.33* 0.32*   Lab Results  Component Value Date   CHOL 192 08/09/2016   HDL 27 (L) 08/09/2016   LDLCALC 111 (H) 08/09/2016   TRIG 268 (H) 08/09/2016   No results found for: DDIMER  Radiology/Studies:  Dg Chest 2 View  Result Date: 08/08/2016 CLINICAL DATA:   Sternal chest pain beginning mid date, history coronary disease post MI and stenting, former smoker, diabetes mellitus, abdominal aortic aneurysm, hypertension EXAM: CHEST  2 VIEW COMPARISON:  01/18/2014 FINDINGS: Enlargement of cardiac silhouette. Mediastinal contours and pulmonary vascularity normal. Bronchitic changes without infiltrate, pleural effusion or pneumothorax. Underlying emphysematous changes. Bones appear demineralized. IMPRESSION: Enlargement of cardiac silhouette. Emphysematous and bronchitic changes likely reflecting COPD. No acute infiltrate. Electronically Signed   By: Lavonia Dana M.D.   On: 08/08/2016 19:10     EKG: Interpreted by me showed: NSR, 68 bpm, no acute st/t changes  Telemetry: Interpreted by me showed: NSR, 60's bpm  Weights: Filed Weights   08/08/16 1839 08/09/16 0059  Weight: 157 lb (71.2 kg) 153 lb 8 oz (69.6 kg)     Physical Exam: Blood pressure (!) 101/55, pulse (!) 58, temperature 98.3 F (36.8 C), temperature source Oral, resp. rate 18, height 5\' 8"  (1.727 m), weight 153 lb 8 oz (69.6 kg), SpO2 94 %. Body mass index is 23.34 kg/m. General: Well developed, well nourished, in no acute distress. Head: Normocephalic, atraumatic, sclera non-icteric, no xanthomas, nares are without discharge.  Neck: Negative for carotid bruits. JVD not elevated. Lungs: Clear bilaterally to auscultation without wheezes, rales, or rhonchi. Breathing is unlabored. Heart: RRR with S1 S2. No murmurs, rubs, or gallops appreciated. Abdomen: Soft, non-tender, non-distended with normoactive bowel sounds. No hepatomegaly. No rebound/guarding. No obvious abdominal masses. Msk:  Strength and tone appear normal for age. Extremities: No clubbing or cyanosis. No edema. Distal pedal pulses are 2+ and equal bilaterally. Neuro: Alert and oriented X 3. No focal deficit. No facial asymmetry. Moves all extremities spontaneously. Psych:  Responds to questions appropriately with a normal affect.     ASSESSMENT AND PLAN:  Principal Problem:   NSTEMI (non-ST elevated myocardial infarction) ([place]) Active Problems:   Coronary artery disease   CKD (chronic kidney disease), stage III   Hyperlipidemia   Hypertension   AAA (abdominal aortic aneurysm) ([facility])   Hypothyroidism   1. NSTEMI: -Currently chest pain free -LHC today showed severe 3-vessel CAD as above with recommendation of transfer to [facility] for evaluation of possible cardiac bypass surgery  -Dr. Fletcher Anon has left a message with [facility] regarding the patient -Restart heparin  gtt 8 hours s/p sheath pull (approximately 11 PM on 08/09/2016) -ASA -Beta blocker -Lipid panel this admission showed LDL not at goal (111), intolerant to statins -A1C pending for further risk stratification   2. CKD stage III: -Hydrated with IV fluids pre-cath -Monitor for post-cath contrast induced nephropathy   3. HTN: -Stable -Continue current medications  4. HLD: -Intolerant to statins -Zetia -Fenofibrate  -Consider retrial of statin  5. Reported DM: -A1C pending -Patient denies DM  6. Hypothyroidism: -Check free T4/total T3 -Continue replacement therapy   7. AAA: -Needs outpatient follow up  8. Peripheral neuropathy: -Continue gabapentin   9. GERD: -Continue Protonix   Signed, Christell Faith, PA-C [place] Pager: [phone] 08/09/2016, 3:11 PM

## 2016-08-09 NOTE — Progress Notes (Signed)
Troponin level 0.29. MD made aware. Heparin drip started at 8.5 ml/h, verified with second RN .

## 2016-08-09 NOTE — Progress Notes (Signed)
*  PRELIMINARY RESULTS* Echocardiogram 2D Echocardiogram has been performed.  Thomas Fuentes 08/09/2016, 9:14 AM

## 2016-08-09 NOTE — Progress Notes (Signed)
Patient is being transferred to [facility] from the Cardiac Cath suite.

## 2016-08-09 NOTE — Progress Notes (Signed)
[address]       [place] [address]             [place] Record [id number] Date of Birth: 18-Aug-1941  Referring: Dr. Fletcher Anon Primary Care: Marton Redwood, MD  Chief Complaint:   Coronary artery disease referred for coronary bypass grafting from [place]  History of Present Illness:     Patien is 75 y.o. male with known  CAD s/p MI and PCI/stenting in 2000.  He has history of CKD stage III, HTN, HLD, AAA measuring 3.2 cm in 2015, and hypothyroidism. He has been told he has DM but says he does not.  He  presented to [facility] on 12/10 with  substernal chest pain c/w prior MI and ruled in with a current troponin of 0.33. In  2015 he  nuclear stress test that was without evidence of ischemia. He has been doing well since he was last seen in the clinic in 10/2015. He works outside , . He was in his usual state of health until the afternoon of 12/10 when he developed substernal chest pain/pressure that was intermittent. It was associated with burping. Pain worse when sitting up, leaning forward. Nothing made the pain worse and it would improve on its own. No associated SOB, nausea, vomiting, diaphoresis, dizziness, presyncope, or syncope. He checked his BP at home and found this to be [id number] systolic. Because of his pain and elevated BP he presented to [facility]. Upon the patient's arrival to [facility] they were found to have troponin 0.03-->0.29-->0.33.   He was started on a heparin gtt. He received full-dose ASA in the ED.  tsh elevated at 6.215. ECG showed NSR, 68 bpm, no acute st/t changes, CXR showed no acute infiltrate. A cardiology  consult was requested ,he was chest pain free. He underwent echo prior to cardiac cath to evaluate EF given his CKD that showed an EF of 50-55%, no RWMA, LV diastolic function was normal, aortic root 42 mm, mild MR.  Cath done today - LHC showed severe 3-vessel CAD with severe ISR in both the LAD and RCA. LV gram was  not performed . LVEDP was moderately elevated. It was  transferred to [facility] for evaluation of possible cardiac bypass surgery.    Current Activity/ Functional Status: Patient is independent with mobility/ambulation, transfers, ADL's, IADL's.   Zubrod Score: At the time of surgery this patient's most appropriate activity status/level should be described as: []     0    Normal activity, no symptoms []     1    Restricted in physical strenuous activity but ambulatory, able to do out light work []     2    Ambulatory and capable of self care, unable to do work activities, up and about                 more than 50%  Of the time                            []     3    Only limited self care, in bed greater than 50% of waking hours []     4    Completely disabled, no self care, confined to bed or chair []     5    Moribund  Past Medical History:  Diagnosis Date  . AAA (abdominal aortic aneurysm) ([place])   .  Chronic renal disease, stage III   . Coronary artery disease    a. MI 2000 s/p PCI and 2 stent placement to unknown arteries; b. Myoview negative; c. LHC 08/09/2016 o-pLAD 30%, mLAD 99%, OM2 50%, OM3 85%, pRCA 99%, mid RCA 60%, RPDA 60%, LVEDP mod elevated  . Diabetes mellitus without complication ([place])   . Dyslipidemia   . Hyperlipidemia   . Hypertension   . Hypothyroidism   . Macular degeneration    bilateral  . Metabolic syndrome   . MI (myocardial infarction)   . Peripheral neuropathy ([place])   . Peripheral vascular disease [facility])     Past Surgical History:  Procedure Laterality Date  . CARDIAC CATHETERIZATION  2000   [place] x2 stents  . CORONARY STENT PLACEMENT      History  Smoking Status  . Former Smoker  . Years: 40.00  . Types: Cigarettes  . Quit date: 02/06/1999  Smokeless Tobacco  . Never Used    History  Alcohol Use  . Yes    Comment: occasional    Social History   Social History  . Marital status: Widowed    Spouse name: N/A  . Number of children: N/A  . Years of  education: N/A   Occupational History  . Not on file.   Social History Main Topics  . Smoking status: Former Smoker    Years: 40.00    Types: Cigarettes    Quit date: 02/06/1999  . Smokeless tobacco: Never Used  . Alcohol use Yes     Comment: occasional  . Drug use: No  . Sexual activity: Not on file   Other Topics Concern  . Not on file   Social History Narrative  . No narrative on file    Allergies  Allergen Reactions  . Lidocaine Anaphylaxis    novacaine and lidocaine  . Statins     GI symptoms on almost all statins    Current Facility-Administered Medications  Medication Dose Route Frequency Provider Last Rate Last Dose  . acetaminophen (TYLENOL) tablet 650 mg  650 mg Oral Q4H PRN Eileen Stanford, PA-C      . [START ON 08/10/2016] aspirin EC tablet 81 mg  81 mg Oral Daily Eileen Stanford, PA-C      . ezetimibe (ZETIA) tablet 10 mg  10 mg Oral Daily Eileen Stanford, PA-C      . fenofibrate tablet 160 mg  160 mg Oral Daily Eileen Stanford, PA-C      . gabapentin (NEURONTIN) tablet 600 mg  600 mg Oral TID Eileen Stanford, PA-C      . heparin ADULT infusion 100 units/mL (25000 units/274mL sodium chloride 0.45%)  850 Units/hr Intravenous Continuous Jolaine Artist, MD      . metoprolol tartrate (LOPRESSOR) tablet 25 mg  25 mg Oral BID Eileen Stanford, PA-C      . multivitamin-lutein (OCUVITE-LUTEIN) capsule 1 capsule  1 capsule Oral Daily Eileen Stanford, PA-C      . nitroGLYCERIN (NITROSTAT) SL tablet 0.4 mg  0.4 mg Sublingual Q5 Min x 3 PRN Eileen Stanford, PA-C      . ondansetron [facility]) injection 4 mg  4 mg Intravenous Q6H PRN Eileen Stanford, PA-C      . pantoprazole (PROTONIX) EC tablet 40 mg  40 mg Oral Daily Eileen Stanford, PA-C      . vitamin E capsule 100 Units  100 Units Oral Daily Eileen Stanford, PA-C  Prescriptions Prior to Admission  Medication Sig Dispense Refill Last Dose  . aspirin 81 MG tablet Take 81 mg by  mouth daily.   08/08/2016 at Unknown time  . beta carotene w/minerals (OCUVITE) tablet Take 1 tablet by mouth daily.   08/08/2016 at Unknown time  . ezetimibe (ZETIA) 10 MG tablet Take 10 mg by mouth daily.   Not Taking at Unknown time  . fenofibrate 160 MG tablet Take 160 mg by mouth daily.   08/08/2016 at Unknown time  . gabapentin (NEURONTIN) 300 MG capsule Take 600 mg by mouth 3 (three) times daily.   08/08/2016 at Unknown time  . heparin 100-0.45 UNIT/ML-% infusion Inject 850 Units/hr into the vein continuous. 250 mL    . metoprolol tartrate (LOPRESSOR) 25 MG tablet Take 1 tablet (25 mg total) by mouth 2 (two) times daily.     Marland Kitchen morphine 4 MG/ML injection Inject 0.5 mLs (2 mg total) into the vein every 2 (two) hours as needed for severe pain.  0   . pantoprazole (PROTONIX) 40 MG tablet Take 40 mg by mouth daily.   08/08/2016 at Unknown time  . ramipril (ALTACE) 10 MG capsule Take 10 mg by mouth daily.    08/08/2016 at Unknown time  . vitamin E 100 UNIT capsule Take 100 Units by mouth daily.   Not Taking at Unknown time    Family History  Problem Relation Age of Onset  . Hypertension Mother      Review of Systems:      Cardiac Review of Systems: Y or N  Chest Pain [    ]  Resting SOB [   ] Exertional SOB  [  ]  Orthopnea [  ]   Pedal Edema [   ]    Palpitations [  ] Syncope  [  ]   Presyncope [   ]  General Review of Systems: [Y] = yes [  ]=no Constitional: recent weight change [  ]; anorexia [  ]; fatigue [  ]; nausea [  ]; night sweats [  ]; fever [  ]; or chills [  ]                                                               Dental: poor dentition[  ]; Last Dentist visit:   Eye : blurred vision [  ]; diplopia [   ]; vision changes [  ];  Amaurosis fugax[  ]; Resp: cough [  ];  wheezing[  ];  hemoptysis[  ]; shortness of breath[  ]; paroxysmal nocturnal dyspnea[  ]; dyspnea on exertion[  ]; or orthopnea[  ];  GI:  gallstones[  ], vomiting[  ];  dysphagia[  ]; melena[  ];   hematochezia [  ]; heartburn[  ];   Hx of  Colonoscopy[  ]; GU: kidney stones [  ]; hematuria[  ];   dysuria [  ];  nocturia[  ];  history of     obstruction [  ]; urinary frequency [  ]             Skin: rash, swelling[  ];, hair loss[  ];  peripheral edema[  ];  or itching[  ]; Musculosketetal: myalgias[  ];  joint swelling[  ];  joint erythema[  ];  joint pain[  ];  back pain[  ];  Heme/Lymph: bruising[  ];  bleeding[  ];  anemia[  ];  Neuro: TIA[  ];  headaches[  ];  stroke[  ];  vertigo[  ];  seizures[  ];   paresthesias[  ];  difficulty walking[  ];  Psych:depression[  ]; anxiety[  ];  Endocrine: diabetes[  ];  thyroid dysfunction[  ];  Immunizations: Flu [  ]; Pneumococcal[  ];  Other:  Physical Exam: BP (!) 156/75   Pulse 68   Temp 98.7 F (37.1 C) (Oral)   Ht 5\' 8"  (1.727 m)   Wt 154 lb 1.6 oz (69.9 kg)   SpO2 96%   BMI 23.43 kg/m    General appearance: alert, cooperative, appears stated age and no distress Head: Normocephalic, without obvious abnormality, atraumatic Neck: no adenopathy, no carotid bruit, no JVD, supple, symmetrical, trachea midline and thyroid not enlarged, symmetric, no tenderness/mass/nodules Lymph nodes: Cervical, supraclavicular, and axillary nodes normal. Resp: clear to auscultation bilaterally Back: symmetric, no curvature. ROM normal. No CVA tenderness. Cardio: regular rate and rhythm, S1, S2 normal, no murmur, click, rub or gallop GI: soft, non-tender; bowel sounds normal; no masses,  no organomegaly Extremities: extremities normal, atraumatic, no cyanosis or edema and vein in loer leg look ok for cabg, t band on right radial Neurologic: Grossly normal  Diagnostic Studies & Laboratory data:    Cardiac catheterization 12/11 Procedures   Left Heart Cath and Coronary Angiography  Conclusion     There is no aortic valve stenosis.  Prox RCA lesion, 99 %stenosed.  Mid RCA lesion, 60 %stenosed.  RPDA lesion, 60 %stenosed.  Ost 2nd Mrg  to 2nd Mrg lesion, 50 %stenosed.  Ost 3rd Mrg lesion, 85 %stenosed.  Ost LAD to Prox LAD lesion, 30 %stenosed.  Mid LAD to Dist LAD lesion, 99 %stenosed.   1. Severe three-vessel coronary artery disease with severe in-stent restenosis in both LAD and RCA. 2. Left ventricular angiography was not performed due to chronic kidney disease. EF was 50% by echo. LVEDP was moderately elevated.  Recommendations: I recommend CABG for revascularization. Resume heparin 8 hours after sheath pull. We'll transfer the patient to [facility] for evaluation.    Echocardiogram: History:   PMH:  AAA, dyslipidemia, PVD, peripheral neuropathy, hypothyroidism.  Coronary artery disease.  Risk factors: Hypertension. Diabetes mellitus.  ------------------------------------------------------------------- Study Conclusions  - Left ventricle: The cavity size was normal. There was mild   concentric hypertrophy. Systolic function was normal. The   estimated ejection fraction was in the range of 50% to 55%. Wall   motion was normal; there were no regional wall motion   abnormalities. Left ventricular diastolic function parameters   were normal. - Aortic valve: Valve area (Vmax): 3.15 cm^2. - Aorta: Aortic root dimension: 42 mm (ED). - Aortic root: The aortic root was mildly dilated. - Mitral valve: There was mild regurgitation.  ------------------------------------------------------------------- Study data:   Study status:  STAT.  Procedure:  The patient reported no pain pre or post test. Transthoracic echocardiography. Image quality was adequate.          Transthoracic echocardiography.  M-mode, complete 2D, spectral Doppler, and color Doppler.  Birthdate:  Patient birthdate: [birth year].  Age:  Patient is 75 yr old.  Sex:  Gender: male.    BMI: 23.3 kg/m^2.  Blood pressure:     143/81  Patient status:  Inpatient.  Study date: Study date: 08/09/2016. Study time:  08:37 AM.  Location:  Bedside.    -------------------------------------------------------------------  ------------------------------------------------------------------- Left ventricle:  The cavity size was normal. There was mild concentric hypertrophy. Systolic function was normal. The estimated ejection fraction was in the range of 50% to 55%. Wall motion was normal; there were no regional wall motion abnormalities. The transmitral flow pattern was normal. The deceleration time of the early transmitral flow velocity was normal. The pulmonary vein flow pattern was normal. The tissue Doppler parameters were normal. Left ventricular diastolic function parameters were normal.  ------------------------------------------------------------------- Aortic valve:   Trileaflet; normal thickness leaflets. Mobility was not restricted.  Doppler:  Transvalvular velocity was within the normal range. There was no stenosis. There was no regurgitation. Peak velocity ratio of LVOT to aortic valve: 0.83. Valve area (Vmax): 3.15 cm^2. Indexed valve area (Vmax): 1.72 cm^2/m^2. Peak gradient (S): 3 mm Hg.  ------------------------------------------------------------------- Aorta:  Aortic root: The aortic root was mildly dilated.  ------------------------------------------------------------------- Mitral valve:   Structurally normal valve.   Mobility was not restricted.  Doppler:  Transvalvular velocity was within the normal range. There was no evidence for stenosis. There was mild regurgitation.    Peak gradient (D): 3 mm Hg.  ------------------------------------------------------------------- Left atrium:  The atrium was normal in size.  ------------------------------------------------------------------- Right ventricle:  The cavity size was normal. Wall thickness was normal. Systolic function was normal.  ------------------------------------------------------------------- Pulmonic valve:    Doppler:  Transvalvular velocity  was within the normal range. There was no evidence for stenosis. There was trivial regurgitation.  ------------------------------------------------------------------- Tricuspid valve:   Structurally normal valve.    Doppler: Transvalvular velocity was within the normal range. There was trivial regurgitation.  ------------------------------------------------------------------- Pulmonary artery:   The main pulmonary artery was normal-sized. Systolic pressure could not be accurately estimated.  ------------------------------------------------------------------- Right atrium:  The atrium was normal in size.  ------------------------------------------------------------------- Pericardium:  There was no pericardial effusion.  ------------------------------------------------------------------- Systemic veins: Inferior vena cava: The vessel was normal in size.  ------------------------------------------------------------------- Measurements   Left ventricle                           Value          Reference  LV ID, ED, PLAX chordal                  45    mm       43 - 52  LV ID, ES, PLAX chordal                  29.2  mm       23 - 38  LV fx shortening, PLAX chordal           35    %        >=29  LV PW thickness, ED                      12.4  mm       ----------  IVS/LV PW ratio, ED                      0.92           <=1.3  LV ejection fraction, 1-p A4C            55    %        ----------  LV end-diastolic volume, 2-p  126   ml       ----------  LV end-systolic volume, 2-p              57    ml       ----------  LV ejection fraction, 2-p                55    %        ----------  Stroke volume, 2-p                       69    ml       ----------  LV end-diastolic volume/bsa, 2-p         69    ml/m^2   ----------  LV end-systolic volume/bsa, 2-p          31    ml/m^2   ----------  Stroke volume/bsa, 2-p                   37.5  ml/m^2   ----------  LV e&', lateral                            7.83  cm/s     ----------  LV E/e&', lateral                         11.21          ----------  LV e&', medial                            4.03  cm/s     ----------  LV E/e&', medial                          21.79          ----------  LV e&', average                           5.93  cm/s     ----------  LV E/e&', average                         14.81          ----------    Ventricular septum                       Value          Reference  IVS thickness, ED                        11.4  mm       ----------    LVOT                                     Value          Reference  LVOT ID, S                               22    mm       ----------  LVOT area  3.8   cm^2     ----------  LVOT peak velocity, S                    73.2  cm/s     ----------    Aortic valve                             Value          Reference  Aortic valve peak velocity, S            88.3  cm/s     ----------  Aortic peak gradient, S                  3     mm Hg    ----------  Velocity ratio, peak, LVOT/AV            0.83           ----------  Aortic valve area, peak velocity         3.15  cm^2     ----------  Aortic valve area/bsa, peak              1.72  cm^2/m^2 ----------  velocity    Aorta                                    Value          Reference  Aortic root ID, ED                       42    mm       ----------    Left atrium                              Value          Reference  LA ID, A-P, ES                           36    mm       ----------  LA ID/bsa, A-P                           1.97  cm/m^2   <=2.2  LA volume, ES, 1-p A4C                   50    ml       ----------  LA volume/bsa, ES, 1-p A4C               27.3  ml/m^2   ----------  LA volume, ES, 1-p A2C                   40.8  ml       ----------  LA volume/bsa, ES, 1-p A2C               22.3  ml/m^2   ----------    Mitral valve                             Value          Reference  Mitral E-wave  peak velocity              87.8  cm/s     ----------  Mitral A-wave peak velocity              41.2  cm/s     ----------  Mitral deceleration time                 216   ms       150 - 230  Mitral peak gradient, D                  3     mm Hg    ----------  Mitral E/A ratio, peak                   2.1            ----------    Right atrium                             Value          Reference  RA ID, S-I, ES, A4C              (L)     5.77  mm       34 - 49  RA area, ES, A4C                         17.3  cm^2     8.3 - 19.5  RA volume, ES, A/L                       41.2  ml       ----------  RA volume/bsa, ES, A/L                   22.5  ml/m^2   ----------    Right ventricle                          Value          Reference  RV ID, ED, PLAX                          29.5  mm       19 - 38  TAPSE                                    25.2  mm       ----------    Pulmonic valve                           Value          Reference  Pulmonic valve peak velocity, S          58.7  cm/s     ----------  Legend: (L)  and  (H)  mark values outside specified reference range.  ------------------------------------------------------------------- Prepared and Electronically Authenticated by  Kathlyn Sacramento, MD 2017-12-11T09:27:25  Recent Radiology Findings:   Dg Chest 2 View  Result Date: 08/08/2016 CLINICAL DATA:  Sternal chest pain beginning mid date, history coronary disease post MI and stenting, former smoker, diabetes mellitus, abdominal aortic aneurysm, hypertension EXAM: CHEST  2 VIEW  COMPARISON:  01/18/2014 FINDINGS: Enlargement of cardiac silhouette. Mediastinal contours and pulmonary vascularity normal. Bronchitic changes without infiltrate, pleural effusion or pneumothorax. Underlying emphysematous changes. Bones appear demineralized. IMPRESSION: Enlargement of cardiac silhouette. Emphysematous and bronchitic changes likely reflecting COPD. No acute infiltrate. Electronically Signed   By: Lavonia Dana M.D.   On: 08/08/2016 19:10     I have independently reviewed the above radiologic studies.  Recent Lab Findings: Lab Results  Component Value Date   WBC 6.9 08/09/2016   HGB 13.3 08/09/2016   HCT 39.2 (L) 08/09/2016   PLT 219 08/09/2016   GLUCOSE 87 08/08/2016   CHOL 192 08/09/2016   TRIG 268 (H) 08/09/2016   HDL 27 (L) 08/09/2016   LDLCALC 111 (H) 08/09/2016   ALT 13 07/15/2013   AST 18 07/15/2013   NA 139 08/08/2016   K 4.0 08/08/2016   CL 104 08/08/2016   CREATININE 1.27 (H) 08/08/2016   BUN 27 (H) 08/08/2016   CO2 28 08/08/2016   TSH 6.215 (H) 08/09/2016   INR 1.09 08/09/2016   Lab Results  Component Value Date   TROPONINI 0.32 (HH) 08/09/2016     Chronic Kidney Disease   Stage I     GFR >90  Stage II    GFR 60-89  Stage IIIA GFR 45-59  Stage IIIB GFR 30-44  Stage IV   GFR 15-29  Stage V    GFR  <15  Lab Results  Component Value Date   CREATININE 1.27 (H) 08/08/2016   Estimated Creatinine Clearance: 47.1 mL/min (by C-G formula based on SCr of 1.27 mg/dL (H)).   Assessment / Plan: Coronary artery disease with nonSTIMI acute  MI  Aorta: Aortic root dimension: 42 mm (ED)   diabetes with complications associated with coronary artery disease and chronic renal disease - patient denies treatment for DM, will check HgbA1C Stage IIIA chronic kidney disease withGFR 45-59 hypothyroidism Intolerant of statins  I have recommended to the patient that we proceed with CABG, will check cr tomorrow, check HgbA1C non contrast ct of chest to evaluate size of Aorta- do not to give contrast with contrast today and poss surgery Wednesday Consider CABG 12/13   I  spent 40 minutes counseling the patient face to face and 50% or more the  time was spent in counseling and coordination of care. The total time spent in the appointment was 60 minutes.    [name] MD      [address] [place] [address] Office [phone]   Beeper  [phone]  08/09/2016 6:38 PM

## 2016-08-09 NOTE — Discharge Summary (Signed)
[facility] NAME: Keyur Novotny    MR#:  [id number]  DATE OF BIRTH:  1941/04/06  DATE OF ADMISSION:  08/08/2016 ADMITTING PHYSICIAN: Harvie Bridge, DO  DATE OF DISCHARGE: 08/09/2016  PRIMARY CARE PHYSICIAN: Marton Redwood, MD    ADMISSION DIAGNOSIS:  Nonspecific chest pain [R07.9] Hypertension, unspecified type [I10]  DISCHARGE DIAGNOSIS:  Principal Problem:   NSTEMI (non-ST elevated myocardial infarction) ([place]) Active Problems:   Coronary artery disease   Hyperlipidemia   Hypertension   CKD (chronic kidney disease), stage III   AAA (abdominal aortic aneurysm) ([place])   Hypothyroidism   SECONDARY DIAGNOSIS:   Past Medical History:  Diagnosis Date  . AAA (abdominal aortic aneurysm) ([place])   . Chronic renal disease, stage III   . Coronary artery disease    Myocardial infarction in 2000 status post PCI and 2 stent placement to unknown arteries  . Diabetes mellitus without complication ([place])   . Dyslipidemia   . Hyperlipidemia   . Hypertension   . Hypothyroidism   . Macular degeneration    bilateral  . Metabolic syndrome   . MI (myocardial infarction)   . Peripheral neuropathy ([place])   . Peripheral vascular disease ([place])     HOSPITAL COURSE:   1. NSTEMI. Patient presented with chest discomfort but pointed to his epigastric region. He states that he had similar symptoms when he had his heart attack a few years back. The patient was found to have borderline troponins. The patient was taken to the cardiac catheter lab by Dr. Fletcher Anon and was found to have extensive disease requiring an evaluation for CABG. Cardiology team spoke with Dr. Haroldine Laws at [facility] as the accepting physician. Cardiology team also made the phone call for transfer. Heparin drip ordered by cardiology team. Continue aspirin. Metoprolol added. Patient has allergy to statin class. 2. Hyperlipidemia and hypertriglyceridemia. On fenofibrate and  Zetia 3. Essential hypertension on Altace and metoprolol 4. Peripheral neuropathy on gabapentin 5. Chronic kidney disease stage III.  DISCHARGE CONDITIONS:   Satisfactory  CONSULTS OBTAINED:  Treatment Team:  Wellington Hampshire, MD  DRUG ALLERGIES:   Allergies  Allergen Reactions  . Lidocaine Anaphylaxis    novacaine and lidocaine  . Statins     GI symptoms on almost all statins    DISCHARGE MEDICATIONS:   Current Discharge Medication List    START taking these medications   Details  heparin 100-0.45 UNIT/ML-% infusion Inject 850 Units/hr into the vein continuous. Qty: 250 mL    metoprolol tartrate (LOPRESSOR) 25 MG tablet Take 1 tablet (25 mg total) by mouth 2 (two) times daily.    morphine 4 MG/ML injection Inject 0.5 mLs (2 mg total) into the vein every 2 (two) hours as needed for severe pain. Refills: 0      CONTINUE these medications which have NOT CHANGED   Details  aspirin 81 MG tablet Take 81 mg by mouth daily.    beta carotene w/minerals (OCUVITE) tablet Take 1 tablet by mouth daily.    fenofibrate 160 MG tablet Take 160 mg by mouth daily.    gabapentin (NEURONTIN) 300 MG capsule Take 600 mg by mouth 3 (three) times daily.    pantoprazole (PROTONIX) 40 MG tablet Take 40 mg by mouth daily.    ramipril (ALTACE) 10 MG capsule Take 10 mg by mouth daily.     ezetimibe (ZETIA) 10 MG tablet Take 10 mg by mouth daily.    vitamin E  100 UNIT capsule Take 100 Units by mouth daily.         DISCHARGE INSTRUCTIONS:   Follow-up with cardiology team at [facility]  If you experience worsening of your admission symptoms, develop shortness of breath, life threatening emergency, suicidal or homicidal thoughts you must seek medical attention immediately by calling 911 or calling your MD immediately  if symptoms less severe.  You Must read complete instructions/literature along with all the possible adverse reactions/side effects for all the Medicines you  take and that have been prescribed to you. Take any new Medicines after you have completely understood and accept all the possible adverse reactions/side effects.   Please note  You were cared for by a hospitalist during your hospital stay. If you have any questions about your discharge medications or the care you received while you were in the hospital after you are discharged, you can call the unit and asked to speak with the hospitalist on call if the hospitalist that took care of you is not available. Once you are discharged, your primary care physician will handle any further medical issues. Please note that NO REFILLS for any discharge medications will be authorized once you are discharged, as it is imperative that you return to your primary care physician (or establish a relationship with a primary care physician if you do not have one) for your aftercare needs so that they can reassess your need for medications and monitor your lab values.    Today   CHIEF COMPLAINT:   Chief Complaint  Patient presents with  . Chest Pain    HISTORY OF PRESENT ILLNESS:  Mykle Matzen  is a 75 y.o. male with a known history of    VITAL SIGNS:  Blood pressure (!) 157/102, pulse 73, temperature 98.3 F (36.8 C), temperature source Oral, resp. rate 18, height 5\' 8"  (1.727 m), weight 69.6 kg (153 lb 8 oz), SpO2 95 %. Blood pressure was 143/79 early this afternoon  I/O:   Intake/Output Summary (Last 24 hours) at 08/09/16 1508 Last data filed at 08/09/16 0454  Gross per 24 hour  Intake                0 ml  Output              300 ml  Net             -300 ml    PHYSICAL EXAMINATION:  GENERAL:  75 y.o.-year-old patient lying in the bed with no acute distress.  EYES: Pupils equal, round, reactive to light and accommodation. No scleral icterus. Extraocular muscles intact.  HEENT: Head atraumatic, normocephalic. Oropharynx and nasopharynx clear.  NECK:  Supple, no jugular venous distention. No  thyroid enlargement, no tenderness.  LUNGS: Normal breath sounds bilaterally, no wheezing, rales,rhonchi or crepitation. No use of accessory muscles of respiration.  CARDIOVASCULAR: S1, S2 normal. No murmurs, rubs, or gallops.  ABDOMEN: Soft, non-tender, non-distended. Bowel sounds present. No organomegaly or mass.  EXTREMITIES: No pedal edema, cyanosis, or clubbing.  NEUROLOGIC: Cranial nerves II through XII are intact. Muscle strength 5/5 in all extremities. Sensation intact. Gait not checked.  PSYCHIATRIC: The patient is alert and oriented x 3.  SKIN: No obvious rash, lesion, or ulcer.   DATA REVIEW:   CBC  Recent Labs Lab 08/09/16 1255  WBC 6.9  HGB 13.3  HCT 39.2*  PLT 219    Chemistries   Recent Labs Lab 08/08/16 1842  NA 139  K 4.0  CL 104  CO2 28  GLUCOSE 87  BUN 27*  CREATININE 1.27*  CALCIUM 9.8    Cardiac Enzymes  Recent Labs Lab 08/09/16 0719  TROPONINI 0.32*     RADIOLOGY:  Dg Chest 2 View  Result Date: 08/08/2016 CLINICAL DATA:  Sternal chest pain beginning mid date, history coronary disease post MI and stenting, former smoker, diabetes mellitus, abdominal aortic aneurysm, hypertension EXAM: CHEST  2 VIEW COMPARISON:  01/18/2014 FINDINGS: Enlargement of cardiac silhouette. Mediastinal contours and pulmonary vascularity normal. Bronchitic changes without infiltrate, pleural effusion or pneumothorax. Underlying emphysematous changes. Bones appear demineralized. IMPRESSION: Enlargement of cardiac silhouette. Emphysematous and bronchitic changes likely reflecting COPD. No acute infiltrate. Electronically Signed   By: Lavonia Dana M.D.   On: 08/08/2016 19:10   Management plans discussed with the patient, and he is in agreement.  CODE STATUS:     Code Status Orders        Start     Ordered   08/09/16 0100  Full code  Continuous     08/09/16 0059    Code Status History    Date Active Date Inactive Code Status Order ID Comments User Context    This patient has a current code status but no historical code status.      TOTAL TIME TAKING CARE OF THIS PATIENT: 35 minutes.    Loletha Grayer M.D on 08/09/2016 at 3:08 PM  Between 7am to 6pm - Pager - [phone]  After 6pm go to [email or web address] - password [email or web address]  [employer] Office  [phone]  CC: Primary care physician; Marton Redwood, MD

## 2016-08-09 NOTE — Progress Notes (Signed)
Patient has arrived on unit from [facility]. Patient oriented to unit, assessed, placed on tele, VS were stable. Has TR band tjhat was full deflated at 1635. On frequent vitals and

## 2016-08-10 ENCOUNTER — Inpatient Hospital Stay (HOSPITAL_COMMUNITY): Payer: Medicare Other

## 2016-08-10 ENCOUNTER — Encounter: Payer: Self-pay | Admitting: Cardiovascular Disease

## 2016-08-10 DIAGNOSIS — I214 Non-ST elevation (NSTEMI) myocardial infarction: Principal | ICD-10-CM

## 2016-08-10 DIAGNOSIS — Z01810 Encounter for preprocedural cardiovascular examination: Secondary | ICD-10-CM

## 2016-08-10 DIAGNOSIS — I25110 Atherosclerotic heart disease of native coronary artery with unstable angina pectoris: Secondary | ICD-10-CM

## 2016-08-10 DIAGNOSIS — E782 Mixed hyperlipidemia: Secondary | ICD-10-CM

## 2016-08-10 LAB — VAS US DOPPLER PRE CABG
LEFT ECA DIAS: -10 cm/s
LEFT VERTEBRAL DIAS: 17 cm/s
Left CCA dist dias: -15 cm/s
Left CCA dist sys: -92 cm/s
Left CCA prox dias: 18 cm/s
Left CCA prox sys: 110 cm/s
Left ICA dist dias: -22 cm/s
Left ICA dist sys: -89 cm/s
Left ICA prox dias: -21 cm/s
Left ICA prox sys: -76 cm/s
RIGHT ECA DIAS: -11 cm/s
RIGHT VERTEBRAL DIAS: 5 cm/s
Right CCA prox dias: 22 cm/s
Right CCA prox sys: 112 cm/s
Right cca dist sys: -78 cm/s

## 2016-08-10 LAB — PULMONARY FUNCTION TEST
FEF 25-75 Post: 1.78 L/sec
FEF 25-75 Pre: 1.77 L/sec
FEF2575-%Change-Post: 0 %
FEF2575-%Pred-Post: 93 %
FEF2575-%Pred-Pre: 93 %
FEV1-%Change-Post: 3 %
FEV1-%Pred-Post: 76 %
FEV1-%Pred-Pre: 74 %
FEV1-Post: 2.07 L
FEV1-Pre: 2.01 L
FEV1FVC-%Change-Post: 1 %
FEV1FVC-%Pred-Pre: 106 %
FEV6-%Change-Post: 0 %
FEV6-%Pred-Post: 75 %
FEV6-%Pred-Pre: 74 %
FEV6-Post: 2.65 L
FEV6-Pre: 2.62 L
FEV6FVC-%Change-Post: 0 %
FEV6FVC-%Pred-Post: 106 %
FEV6FVC-%Pred-Pre: 107 %
FVC-%Change-Post: 2 %
FVC-%Pred-Post: 70 %
FVC-%Pred-Pre: 69 %
FVC-Post: 2.68 L
FVC-Pre: 2.62 L
Post FEV1/FVC ratio: 77 %
Post FEV6/FVC ratio: 99 %
Pre FEV1/FVC ratio: 76 %
Pre FEV6/FVC Ratio: 100 %

## 2016-08-10 LAB — CBC
HCT: 39.6 % (ref 39.0–52.0)
HEMOGLOBIN: 13.1 g/dL (ref 13.0–17.0)
MCH: 30.4 pg (ref 26.0–34.0)
MCHC: 33.1 g/dL (ref 30.0–36.0)
MCV: 91.9 fL (ref 78.0–100.0)
PLATELETS: 206 10*3/uL (ref 150–400)
RBC: 4.31 MIL/uL (ref 4.22–5.81)
RDW: 13.7 % (ref 11.5–15.5)
WBC: 8.2 10*3/uL (ref 4.0–10.5)

## 2016-08-10 LAB — GLUCOSE, CAPILLARY
GLUCOSE-CAPILLARY: 117 mg/dL — AB (ref 65–99)
GLUCOSE-CAPILLARY: 142 mg/dL — AB (ref 65–99)

## 2016-08-10 LAB — HEMOGLOBIN A1C
Hgb A1c MFr Bld: 5.9 % — ABNORMAL HIGH (ref 4.8–5.6)
MEAN PLASMA GLUCOSE: 123 mg/dL

## 2016-08-10 LAB — BLOOD GAS, ARTERIAL
Acid-base deficit: 0.4 mmol/L (ref 0.0–2.0)
Bicarbonate: 23.6 mmol/L (ref 20.0–28.0)
Drawn by: 330991
FIO2: 21
O2 Saturation: 92.7 %
Patient temperature: 98.6
pCO2 arterial: 37.7 mmHg (ref 32.0–48.0)
pH, Arterial: 7.413 (ref 7.350–7.450)
pO2, Arterial: 69.4 mmHg — ABNORMAL LOW (ref 83.0–108.0)

## 2016-08-10 LAB — COMPREHENSIVE METABOLIC PANEL
ALT: 11 U/L — ABNORMAL LOW (ref 17–63)
AST: 19 U/L (ref 15–41)
Albumin: 3.8 g/dL (ref 3.5–5.0)
Alkaline Phosphatase: 31 U/L — ABNORMAL LOW (ref 38–126)
Anion gap: 10 (ref 5–15)
BUN: 19 mg/dL (ref 6–20)
CO2: 23 mmol/L (ref 22–32)
Calcium: 9.8 mg/dL (ref 8.9–10.3)
Chloride: 103 mmol/L (ref 101–111)
Creatinine, Ser: 1.2 mg/dL (ref 0.61–1.24)
GFR calc Af Amer: >60 mL/min (ref >60)
GFR calc non Af Amer: 57 mL/min — ABNORMAL LOW (ref >60)
Glucose, Bld: 108 mg/dL — ABNORMAL HIGH (ref 65–99)
Potassium: 4.1 mmol/L (ref 3.5–5.1)
Sodium: 136 mmol/L (ref 135–145)
Total Bilirubin: 1 mg/dL (ref 0.3–1.2)
Total Protein: 6.8 g/dL (ref 6.5–8.1)

## 2016-08-10 LAB — ABO/RH: ABO/RH(D): A POS

## 2016-08-10 LAB — HEPARIN LEVEL (UNFRACTIONATED): HEPARIN UNFRACTIONATED: 0.15 [IU]/mL — AB (ref 0.30–0.70)

## 2016-08-10 LAB — TYPE AND SCREEN
ABO/RH(D): A POS
Antibody Screen: NEGATIVE

## 2016-08-10 LAB — PROTIME-INR
INR: 1.08
Prothrombin Time: 14.1 seconds (ref 11.4–15.2)

## 2016-08-10 LAB — APTT: aPTT: 41 seconds — ABNORMAL HIGH (ref 24–36)

## 2016-08-10 MED ORDER — SODIUM CHLORIDE 0.9 % IV SOLN
INTRAVENOUS | Status: AC
  Administered 2016-08-11: 1 [IU]/h via INTRAVENOUS
  Filled 2016-08-10: qty 2.5

## 2016-08-10 MED ORDER — NITROGLYCERIN IN D5W 200-5 MCG/ML-% IV SOLN
2.0000 ug/min | INTRAVENOUS | Status: AC
  Administered 2016-08-11: 10 ug/min via INTRAVENOUS
  Filled 2016-08-10: qty 250

## 2016-08-10 MED ORDER — POTASSIUM CHLORIDE 2 MEQ/ML IV SOLN
80.0000 meq | INTRAVENOUS | Status: DC
  Filled 2016-08-10: qty 40

## 2016-08-10 MED ORDER — CHLORHEXIDINE GLUCONATE 4 % EX LIQD
CUTANEOUS | Status: AC
  Administered 2016-08-11
  Filled 2016-08-10: qty 15

## 2016-08-10 MED ORDER — MAGNESIUM SULFATE 50 % IJ SOLN
40.0000 meq | INTRAMUSCULAR | Status: DC
  Filled 2016-08-10: qty 10

## 2016-08-10 MED ORDER — DEXTROSE 5 % IV SOLN
750.0000 mg | INTRAVENOUS | Status: DC
  Filled 2016-08-10: qty 750

## 2016-08-10 MED ORDER — TRANEXAMIC ACID (OHS) PUMP PRIME SOLUTION
2.0000 mg/kg | INTRAVENOUS | Status: DC
  Filled 2016-08-10: qty 1.35

## 2016-08-10 MED ORDER — TEMAZEPAM 15 MG PO CAPS: 15.0000 mg | ORAL_CAPSULE | Freq: Once | ORAL | Status: DC | PRN

## 2016-08-10 MED ORDER — PLASMA-LYTE 148 IV SOLN
INTRAVENOUS | Status: AC
  Administered 2016-08-11: 500 mL
  Filled 2016-08-10: qty 2.5

## 2016-08-10 MED ORDER — TRANEXAMIC ACID (OHS) BOLUS VIA INFUSION
15.0000 mg/kg | INTRAVENOUS | Status: AC
  Administered 2016-08-11: 1009.5 mg via INTRAVENOUS
  Filled 2016-08-10: qty 1010

## 2016-08-10 MED ORDER — DEXTROSE 5 % IV SOLN
1.5000 g | INTRAVENOUS | Status: AC
  Administered 2016-08-11: 1.5 g via INTRAVENOUS
  Administered 2016-08-11: .75 g via INTRAVENOUS
  Filled 2016-08-10: qty 1.5

## 2016-08-10 MED ORDER — VANCOMYCIN HCL 10 G IV SOLR
1250.0000 mg | INTRAVENOUS | Status: AC
  Administered 2016-08-11: 1250 mg via INTRAVENOUS
  Filled 2016-08-10: qty 1250

## 2016-08-10 MED ORDER — SODIUM CHLORIDE 0.9 % IV SOLN
INTRAVENOUS | Status: DC
  Filled 2016-08-10: qty 30

## 2016-08-10 MED ORDER — TRANEXAMIC ACID 1000 MG/10ML IV SOLN
1.5000 mg/kg/h | INTRAVENOUS | Status: AC
  Administered 2016-08-11: 1.5 mg/kg/h via INTRAVENOUS
  Filled 2016-08-10: qty 25

## 2016-08-10 MED ORDER — DEXMEDETOMIDINE HCL IN NACL 400 MCG/100ML IV SOLN
0.1000 ug/kg/h | INTRAVENOUS | Status: AC
  Administered 2016-08-11: .3 ug/kg/h via INTRAVENOUS
  Filled 2016-08-10: qty 100

## 2016-08-10 MED ORDER — EPINEPHRINE PF 1 MG/ML IJ SOLN
0.0000 ug/min | INTRAVENOUS | Status: DC
  Filled 2016-08-10: qty 4

## 2016-08-10 MED ORDER — ALBUTEROL SULFATE (2.5 MG/3ML) 0.083% IN NEBU
2.5000 mg | INHALATION_SOLUTION | Freq: Once | RESPIRATORY_TRACT | Status: AC
  Administered 2016-08-10: 2.5 mg via RESPIRATORY_TRACT

## 2016-08-10 MED ORDER — DOPAMINE-DEXTROSE 3.2-5 MG/ML-% IV SOLN
0.0000 ug/kg/min | INTRAVENOUS | Status: DC
  Filled 2016-08-10: qty 250

## 2016-08-10 MED ORDER — METOPROLOL TARTRATE 12.5 MG HALF TABLET: 12.5000 mg | ORAL_TABLET | Freq: Once | ORAL | Status: DC

## 2016-08-10 MED ORDER — PROSIGHT PO TABS
1.0000 | ORAL_TABLET | Freq: Every day | ORAL | Status: DC
Start: 2016-08-10 — End: 2016-08-11
  Administered 2016-08-10: 1 via ORAL
  Filled 2016-08-10: qty 1

## 2016-08-10 MED ORDER — BISACODYL 5 MG PO TBEC
5.0000 mg | DELAYED_RELEASE_TABLET | Freq: Once | ORAL | Status: AC
  Administered 2016-08-10: 5 mg via ORAL
  Filled 2016-08-10: qty 1

## 2016-08-10 MED ORDER — DEXTROSE 5 % IV SOLN
30.0000 ug/min | INTRAVENOUS | Status: AC
  Administered 2016-08-11 (×2): 10 ug/min via INTRAVENOUS
  Filled 2016-08-10: qty 2

## 2016-08-10 NOTE — Progress Notes (Signed)
[place] for heparin drip Indication: CAD  Allergies  Allergen Reactions  . Lidocaine Anaphylaxis    novacaine and lidocaine  . Statins     GI symptoms on almost all statins    Patient Measurements: Height: 5\' 8"  (172.7 cm) Weight: 154 lb 1.6 oz (69.9 kg) IBW/kg (Calculated) : 68.4 Heparin Dosing Weight: 70kg  Vital Signs: Temp: 98.4 F (36.9 C) (12/11 2103) Temp Source: Oral (12/11 2103) BP: 155/85 (12/11 2103) Pulse Rate: 74 (12/11 2103)  Labs:  Recent Labs  08/08/16 1842 08/09/16 0123 08/09/16 0304 08/09/16 0420 08/09/16 0719 08/09/16 1255 08/10/16 0346  HGB 14.2  --   --   --   --  13.3 13.1  HCT 42.3  --   --   --   --  39.2* 39.6  PLT 230  --   --   --   --  219 206  APTT  --   --  35  --   --   --   --   LABPROT  --   --  14.1  --   --   --  14.1  INR  --   --  1.09  --   --   --  1.08  HEPARINUNFRC  --   --   --   --   --  0.27* <0.10*  CREATININE 1.27*  --   --   --   --   --  1.20  TROPONINI 0.03* 0.29*  --  0.33* 0.32*  --   --     Estimated Creatinine Clearance: 49.9 mL/min (by C-G formula based on SCr of 1.2 mg/dL).  Assessment: 75 y.o. male with CAD awaiting CABG for heparin  Goal of Therapy:  Heparin level 0.3-0.7 units/ml Monitor platelets by anticoagulation protocol: Yes   Plan:  Increase Heparin 1000 units/hr Check heparin level in 8 hours.   Phillis Knack, PharmD, BCPS  08/10/2016 5:24 AM

## 2016-08-10 NOTE — Progress Notes (Signed)
[place] for heparin drip Indication: CAD  Allergies  Allergen Reactions  . Lidocaine Anaphylaxis    novacaine and lidocaine  . Statins Nausea And Vomiting    GI symptoms on almost all statins    Patient Measurements: Height: 5\' 8"  (172.7 cm) Weight: 148 lb 5.9 oz (67.3 kg) IBW/kg (Calculated) : 68.4 Heparin Dosing Weight: 70kg  Vital Signs: Temp: 98.3 F (36.8 C) (12/12 0552) Temp Source: Oral (12/12 0552) BP: 107/67 (12/12 0552) Pulse Rate: 54 (12/12 0552)  Labs:  Recent Labs  08/08/16 1842 08/09/16 0123 08/09/16 0304 08/09/16 0420 08/09/16 0719 08/09/16 1255 08/10/16 0346 08/10/16 1613  HGB 14.2  --   --   --   --  13.3 13.1  --   HCT 42.3  --   --   --   --  39.2* 39.6  --   PLT 230  --   --   --   --  219 206  --   APTT  --   --  35  --   --   --   --  41*  LABPROT  --   --  14.1  --   --   --  14.1  --   INR  --   --  1.09  --   --   --  1.08  --   HEPARINUNFRC  --   --   --   --   --  0.27* <0.10* 0.15*  CREATININE 1.27*  --   --   --   --   --  1.20  --   TROPONINI 0.03* 0.29*  --  0.33* 0.32*  --   --   --      Estimated Creatinine Clearance: 49.1 mL/min (by C-G formula based on SCr of 1.2 mg/dL).   Assessment: 75 yo male admitted with NSTEMI. Planning to undergo CABG tomorrow. Remains on heparin gtt, afternoon level is subtherapeutic. No issues with infusion. CBC stable/wnl.   Goal of Therapy:  Heparin level 0.3-0.7 units/ml Monitor platelets by anticoagulation protocol: Yes    Plan:  -Increase heparin to 1200 units/hr -Daily HL, CBC -Level at midnight    Hughes Better, PharmD, BCPS Clinical Pharmacist 08/10/2016 5:00 PM

## 2016-08-10 NOTE — Progress Notes (Signed)
CARDIAC REHAB PHASE I   PRE:  Rate/Rhythm: 63 SR    BP: sitting 13280    SaO2: 5 RA  MODE:  Ambulation: 530 ft   POST:  Rate/Rhythm: 63 SR    BP: sitting 130/80     SaO2:   Tolerated well, moves very well. Wanted to walk farther, likes to keep himself in shape. Discussed sternal precautions, IS (doing 1500 mL), mobility, and d/c planning. Voiced understanding. Gave him [facility] booklet, careguide, and video to watch. He can walk indepedently. [id number]   [place], ACSM 08/10/2016 10:48 AM

## 2016-08-10 NOTE — Progress Notes (Addendum)
Patient Name: Thomas Fuentes Date of Encounter: 08/10/2016  Primary Cardiologist: Dr. Antionette Char Problem List     Principal Problem:   NSTEMI (non-ST elevated myocardial infarction) [facility]) Active Problems:   Coronary artery disease   Hyperlipidemia   Hypertension   CKD (chronic kidney disease), stage III   AAA (abdominal aortic aneurysm) ([facility])   Hypothyroidism     Subjective   No CP.  CABG tomorrow.   Inpatient Medications    Scheduled Meds: . aspirin EC  81 mg Oral Daily  . ezetimibe  10 mg Oral Daily  . fenofibrate  160 mg Oral Daily  . gabapentin  600 mg Oral TID  . metoprolol tartrate  25 mg Oral BID  . multivitamin  1 tablet Oral Daily  . pantoprazole  40 mg Oral Daily  . vitamin E  100 Units Oral Daily   Continuous Infusions: . heparin 1,000 Units/hr (08/10/16 0627)   PRN Meds: acetaminophen, nitroGLYCERIN, ondansetron (ZOFRAN) IV   Vital Signs    Vitals:   08/09/16 1713 08/09/16 1736 08/09/16 2103 08/10/16 0552  BP: (!) 146/86 (!) 156/75 (!) 155/85 107/67  Pulse: 68  74 (!) 54  Resp:   18 18  Temp:   98.4 F (36.9 C) 98.3 F (36.8 C)  TempSrc:   Oral Oral  SpO2:   95% 95%  Weight:    148 lb 5.9 oz (67.3 kg)  Height:        Intake/Output Summary (Last 24 hours) at 08/10/16 1249 Last data filed at 08/10/16 0300  Gross per 24 hour  Intake           278.82 ml  Output                0 ml  Net           278.82 ml   Filed Weights   08/09/16 1643 08/10/16 0552  Weight: 154 lb 1.6 oz (69.9 kg) 148 lb 5.9 oz (67.3 kg)    Physical Exam    GEN: Well nourished, well developed, in no acute distress.  HEENT: Grossly normal.  Neck: Supple, no JVD, carotid bruits, or masses. Cardiac: RRR, no murmurs, rubs, or gallops. No clubbing, cyanosis, edema.  Radials/DP/PT 2+ and equal bilaterally.  Respiratory:  Respirations regular and unlabored, clear to auscultation bilaterally. GI: Soft, nontender, nondistended, BS + x 4. MS: no deformity or  atrophy. Skin: warm and dry, no rash. Neuro:  Strength and sensation are intact. Psych: AAOx3.  Normal affect.  Labs    CBC  Recent Labs  08/09/16 1255 08/10/16 0346  WBC 6.9 8.2  HGB 13.3 13.1  HCT 39.2* 39.6  MCV 90.6 91.9  PLT 219 [id number]   Basic Metabolic Panel  Recent Labs  08/08/16 1842 08/10/16 0346  NA 139 136  K 4.0 4.1  CL 104 103  CO2 28 23  GLUCOSE 87 108*  BUN 27* 19  CREATININE 1.27* 1.20  CALCIUM 9.8 9.8   Liver Function Tests  Recent Labs  08/10/16 0346  AST 19  ALT 11*  ALKPHOS 31*  BILITOT 1.0  PROT 6.8  ALBUMIN 3.8   No results for input(s): LIPASE, AMYLASE in the last 72 hours. Cardiac Enzymes  Recent Labs  08/09/16 0123 08/09/16 0420 08/09/16 0719  TROPONINI 0.29* 0.33* 0.32*   BNP Invalid input(s): POCBNP D-Dimer No results for input(s): DDIMER in the last 72 hours. Hemoglobin A1C  Recent Labs  08/09/16 1255  HGBA1C 5.9*  Fasting Lipid Panel  Recent Labs  08/09/16 0719  CHOL 192  HDL 27*  LDLCALC 111*  TRIG 268*  CHOLHDL 7.1   Thyroid Function Tests  Recent Labs  08/09/16 0123  TSH 6.215*    Telemetry    NSR - Personally Reviewed  ECG    NSR, no ST segment changes- Personally Reviewed  Radiology    Dg Chest 2 View  Result Date: 08/08/2016 CLINICAL DATA:  Sternal chest pain beginning mid date, history coronary disease post MI and stenting, former smoker, diabetes mellitus, abdominal aortic aneurysm, hypertension EXAM: CHEST  2 VIEW COMPARISON:  01/18/2014 FINDINGS: Enlargement of cardiac silhouette. Mediastinal contours and pulmonary vascularity normal. Bronchitic changes without infiltrate, pleural effusion or pneumothorax. Underlying emphysematous changes. Bones appear demineralized. IMPRESSION: Enlargement of cardiac silhouette. Emphysematous and bronchitic changes likely reflecting COPD. No acute infiltrate. Electronically Signed   By: Lavonia Dana M.D.   On: 08/08/2016 19:10   Ct Chest Wo  Contrast  Result Date: 08/10/2016 CLINICAL DATA:  Chest pain EXAM: CT CHEST WITHOUT CONTRAST TECHNIQUE: Multidetector CT imaging of the chest was performed following the standard protocol without IV contrast. COMPARISON:  Chest radiograph August 08, 2016 FINDINGS: Cardiovascular: The measured transverse diameter of the ascending thoracic aorta is 4.2 x 4.1 cm, measured on axial slice 59 series 2. There are foci of atherosclerotic calcification in the aorta. The visualized great vessels show scattered foci of calcification. Visualized great vessels otherwise appear unremarkable on this noncontrast enhanced study. There are multiple foci of coronary artery calcification. Heart is mildly enlarged. Pericardium is not appreciably thickened. Mediastinum/Nodes: Visualized thyroid appears unremarkable. There is no appreciable thoracic adenopathy. There is a small hiatal hernia. Lungs/Pleura: There is a degree of underlying centrilobular emphysematous change. There is a mild degree of peripheral fibrosis in the upper lobe regions. There is no edema or consolidation. There is no pleural thickening or pleural effusion evident. Upper Abdomen: In the visualized upper abdomen, there is atherosclerotic calcification in the aorta and major mesenteric vessels. Visualized upper abdominal structures otherwise appear unremarkable. Musculoskeletal: There are no blastic or lytic bone lesions. There is fairly mild degenerative change in the thoracic spine. There is also degenerative change in each shoulder. There is slight endplate concavity along the superior aspect of the T5 vertebral body. IMPRESSION: Ascending thoracic aortic diameter is 4.2 x 4.1 cm. Recommend annual imaging followup by CTA or MRA. This recommendation follows 2010 ACCF/AHA/AATS/ACR/ASA/SCA/SCAI/SIR/STS/SVM Guidelines for the Diagnosis and Management of Patients with Thoracic Aortic Disease. Circulation. 2010; 121SP:1689793. There is atherosclerotic  calcification in the aorta as well as multiple foci of coronary artery calcification. Heart is mildly enlarged. Pericardium is not thickened. There is a degree of underlying centrilobular emphysematous change with mild fibrotic change in the periphery of the upper lobes bilaterally. No edema or consolidation. No adenopathy. Electronically Signed   By: Lowella Grip III M.D.   On: 08/10/2016 08:10    Cardiac Studies   No significant carotid disease  Patient Profile     75 y/o with multivessel CAD, hyperlipidemia for CABG tomorrow.  Assessment & Plan    1) NSTEMI.  Plan for CABG tomorrow.  Imaging studies ordered.  Will need zetia therapy and aspirin going forward.  Intolerant of statins for hyperlipidemia.  COnsider plavix postoperatively.  Signed, Larae Grooms, MD  08/10/2016, 12:49 PM

## 2016-08-10 NOTE — Progress Notes (Signed)
Patient ID: Thomas Fuentes, male   DOB: [birth year], 75 y.o.   MRN: [id number]      [address]       [place] [address]             [phone]                   Procedure(s) (LRB): CORONARY ARTERY BYPASS GRAFTING (CABG) (N/A) TRANSESOPHAGEAL ECHOCARDIOGRAM (TEE) (N/A)  LOS: 1 day   Subjective: No chest pain this am  Objective: Vital signs in last 24 hours: Patient Vitals for the past 24 hrs:  BP Temp Temp src Pulse Resp SpO2 Height Weight  08/10/16 0552 107/67 98.3 F (36.8 C) Oral (!) 54 18 95 % - 148 lb 5.9 oz (67.3 kg)  08/09/16 2103 (!) 155/85 98.4 F (36.9 C) Oral 74 18 95 % - -  08/09/16 1736 (!) 156/75 - - - - - - -  08/09/16 1713 (!) 146/86 - - 68 - - - -  08/09/16 1658 (!) 144/76 - - 65 - - - -  08/09/16 1643 (!) 160/85 98.7 F (37.1 C) Oral 65 - 96 % 5\' 8"  (1.727 m) 154 lb 1.6 oz (69.9 kg)    Filed Weights   08/09/16 1643 08/10/16 0552  Weight: 154 lb 1.6 oz (69.9 kg) 148 lb 5.9 oz (67.3 kg)    Hemodynamic parameters for last 24 hours:    Intake/Output from previous day: 12/11 0701 - 12/12 0700 In: 278.8 [P.O.:240; I.V.:38.8] Out: -  Intake/Output this shift: No intake/output data recorded.  Scheduled Meds: . aspirin EC  81 mg Oral Daily  . ezetimibe  10 mg Oral Daily  . fenofibrate  160 mg Oral Daily  . gabapentin  600 mg Oral TID  . metoprolol tartrate  25 mg Oral BID  . multivitamin  1 tablet Oral Daily  . pantoprazole  40 mg Oral Daily  . vitamin E  100 Units Oral Daily   Continuous Infusions: . heparin 1,000 Units/hr (08/10/16 0627)   PRN Meds:.acetaminophen, nitroGLYCERIN, ondansetron (ZOFRAN) IV  General appearance: alert, cooperative and no distress Neurologic: intact Heart: regular rate and rhythm, S1, S2 normal, no murmur, click, rub or gallop Lungs: clear to auscultation bilaterally Abdomen: soft, non-tender; bowel sounds normal; no masses,  no organomegaly Extremities: extremities normal, atraumatic, no  cyanosis or edema and Homans sign is negative, no sign of DVT Wound: right radial cath site ok  Lab Results: CBC: Recent Labs  08/09/16 1255 08/10/16 0346  WBC 6.9 8.2  HGB 13.3 13.1  HCT 39.2* 39.6  PLT 219 206   BMET:  Recent Labs  08/08/16 1842 08/10/16 0346  NA 139 136  K 4.0 4.1  CL 104 103  CO2 28 23  GLUCOSE 87 108*  BUN 27* 19  CREATININE 1.27* 1.20  CALCIUM 9.8 9.8    PT/INR:  Recent Labs  08/10/16 0346  LABPROT 14.1  INR 1.08     Radiology Dg Chest 2 View  Result Date: 08/08/2016 CLINICAL DATA:  Sternal chest pain beginning mid date, history coronary disease post MI and stenting, former smoker, diabetes mellitus, abdominal aortic aneurysm, hypertension EXAM: CHEST  2 VIEW COMPARISON:  01/18/2014 FINDINGS: Enlargement of cardiac silhouette. Mediastinal contours and pulmonary vascularity normal. Bronchitic changes without infiltrate, pleural effusion or pneumothorax. Underlying emphysematous changes. Bones appear demineralized. IMPRESSION: Enlargement of cardiac silhouette. Emphysematous and bronchitic changes likely reflecting COPD. No acute infiltrate. Electronically Signed   By: Elta Guadeloupe  Thornton Papas M.D.   On: 08/08/2016 19:10   Ct Chest Wo Contrast  Result Date: 08/10/2016 CLINICAL DATA:  Chest pain EXAM: CT CHEST WITHOUT CONTRAST TECHNIQUE: Multidetector CT imaging of the chest was performed following the standard protocol without IV contrast. COMPARISON:  Chest radiograph August 08, 2016 FINDINGS: Cardiovascular: The measured transverse diameter of the ascending thoracic aorta is 4.2 x 4.1 cm, measured on axial slice 59 series 2. There are foci of atherosclerotic calcification in the aorta. The visualized great vessels show scattered foci of calcification. Visualized great vessels otherwise appear unremarkable on this noncontrast enhanced study. There are multiple foci of coronary artery calcification. Heart is mildly enlarged. Pericardium is not appreciably  thickened. Mediastinum/Nodes: Visualized thyroid appears unremarkable. There is no appreciable thoracic adenopathy. There is a small hiatal hernia. Lungs/Pleura: There is a degree of underlying centrilobular emphysematous change. There is a mild degree of peripheral fibrosis in the upper lobe regions. There is no edema or consolidation. There is no pleural thickening or pleural effusion evident. Upper Abdomen: In the visualized upper abdomen, there is atherosclerotic calcification in the aorta and major mesenteric vessels. Visualized upper abdominal structures otherwise appear unremarkable. Musculoskeletal: There are no blastic or lytic bone lesions. There is fairly mild degenerative change in the thoracic spine. There is also degenerative change in each shoulder. There is slight endplate concavity along the superior aspect of the T5 vertebral body. IMPRESSION: Ascending thoracic aortic diameter is 4.2 x 4.1 cm. Recommend annual imaging followup by CTA or MRA. This recommendation follows 2010 ACCF/AHA/AATS/ACR/ASA/SCA/SCAI/SIR/STS/SVM Guidelines for the Diagnosis and Management of Patients with Thoracic Aortic Disease. Circulation. 2010; [id number]. There is atherosclerotic calcification in the aorta as well as multiple foci of coronary artery calcification. Heart is mildly enlarged. Pericardium is not thickened. There is a degree of underlying centrilobular emphysematous change with mild fibrotic change in the periphery of the upper lobes bilaterally. No edema or consolidation. No adenopathy. Electronically Signed   By: Lowella Grip III M.D.   On: 08/10/2016 08:10     Assessment/Plan: S/P Procedure(s) (LRB): CORONARY ARTERY BYPASS GRAFTING (CABG) (N/A) TRANSESOPHAGEAL ECHOCARDIOGRAM (TEE) (N/A) Mild dilation of aorta- see CT results , aortic valve is Aortic valve:   Trileaflet; normal thickness leaflets Renal function stable HgbA1C 5.9 %  Plan CABG as discussed with patient in am The goals  risks and alternatives of the planned surgical procedure CABG have been discussed with the patient in detail. The risks of the procedure including death, infection, stroke, myocardial infarction, bleeding, blood transfusion have all been discussed specifically.  I have quoted Larina Earthly Perrault a 3 % of perioperative mortality and a complication rate as high as 40 %. The patient's questions have been answered.Larina Earthly [place]lin is willing  to proceed with the planned procedure.  [name] MD 08/10/2016 9:58 AM

## 2016-08-10 NOTE — Progress Notes (Signed)
Pre-op Cardiac Surgery  Carotid Findings:  Findings suggest 1-39% internal carotid artery stenosis bilaterally. Vertebral arteries are patent with antegrade flow.  Upper Extremity Right Left  Brachial Pressures 103-Triphasic 101-Triphasic  Radial Waveforms Triphasic Triphasic  Ulnar Waveforms Triphasic Triphasic  Palmar Arch (Allen's Test) Within normal limits Signal obliterates with radial compression, is unaffected with ulnar compression.      Lower  Extremity Right Left  Dorsalis Pedis 130-Biphasic 95- Monophasic  Anterior Tibial    Posterior Tibial 135-Triphasic 112-Biphasic  Ankle/Brachial Indices      Findings:   Bilateral ABIs are within normal limits at rest.  08/10/2016 12:41 PM Thomas Fuentes, BS, RVT, RDCS, RDMS

## 2016-08-11 ENCOUNTER — Inpatient Hospital Stay (HOSPITAL_COMMUNITY): Payer: Medicare Other

## 2016-08-11 ENCOUNTER — Encounter (HOSPITAL_COMMUNITY)
Admission: AD | Disposition: A | Discharge status: Home / Self Care | Payer: Self-pay | Source: Other Acute Inpatient Hospital | Attending: Cardiothoracic Surgery

## 2016-08-11 ENCOUNTER — Inpatient Hospital Stay (HOSPITAL_COMMUNITY): Payer: Medicare Other | Admitting: Anesthesiology

## 2016-08-11 DIAGNOSIS — Z951 Presence of aortocoronary bypass graft: Secondary | ICD-10-CM

## 2016-08-11 HISTORY — PX: CORONARY ARTERY BYPASS GRAFT: SHX141

## 2016-08-11 HISTORY — DX: Presence of aortocoronary bypass graft: Z95.1

## 2016-08-11 HISTORY — PX: TEE WITHOUT CARDIOVERSION: SHX5443

## 2016-08-11 HISTORY — PX: ENDOVEIN HARVEST OF GREATER SAPHENOUS VEIN: SHX5059

## 2016-08-11 LAB — APTT: APTT: 31 s (ref 24–36)

## 2016-08-11 LAB — POCT I-STAT, CHEM 8
BUN: 28 mg/dL — ABNORMAL HIGH (ref 6–20)
BUN: 20 mg/dL (ref 6–20)
BUN: 25 mg/dL — AB (ref 6–20)
BUN: 21 mg/dL — ABNORMAL HIGH (ref 6–20)
BUN: 21 mg/dL — AB (ref 6–20)
CALCIUM ION: 1.29 mmol/L (ref 1.15–1.40)
CALCIUM ION: 1.09 mmol/L — AB (ref 1.15–1.40)
CHLORIDE: 105 mmol/L (ref 101–111)
CHLORIDE: 97 mmol/L — AB (ref 101–111)
CREATININE: 0.8 mg/dL (ref 0.61–1.24)
CREATININE: 1 mg/dL (ref 0.61–1.24)
Calcium, Ion: 1.03 mmol/L — ABNORMAL LOW (ref 1.15–1.40)
Calcium, Ion: 1.26 mmol/L (ref 1.15–1.40)
Calcium, Ion: 1.09 mmol/L — ABNORMAL LOW (ref 1.15–1.40)
Chloride: 104 mmol/L (ref 101–111)
Chloride: 101 mmol/L (ref 101–111)
Chloride: 98 mmol/L — ABNORMAL LOW (ref 101–111)
Creatinine, Ser: 1.1 mg/dL (ref 0.61–1.24)
Creatinine, Ser: 1 mg/dL (ref 0.61–1.24)
Creatinine, Ser: 1 mg/dL (ref 0.61–1.24)
GLUCOSE: 124 mg/dL — AB (ref 65–99)
GLUCOSE: 110 mg/dL — AB (ref 65–99)
Glucose, Bld: 110 mg/dL — ABNORMAL HIGH (ref 65–99)
Glucose, Bld: 106 mg/dL — ABNORMAL HIGH (ref 65–99)
Glucose, Bld: 121 mg/dL — ABNORMAL HIGH (ref 65–99)
HCT: 37 % — ABNORMAL LOW (ref 39.0–52.0)
HCT: 25 % — ABNORMAL LOW (ref 39.0–52.0)
HCT: 26 % — ABNORMAL LOW (ref 39.0–52.0)
HEMATOCRIT: 29 % — AB (ref 39.0–52.0)
HEMATOCRIT: 30 % — AB (ref 39.0–52.0)
HEMOGLOBIN: 8.5 g/dL — AB (ref 13.0–17.0)
Hemoglobin: 12.6 g/dL — ABNORMAL LOW (ref 13.0–17.0)
Hemoglobin: 10.2 g/dL — ABNORMAL LOW (ref 13.0–17.0)
Hemoglobin: 9.9 g/dL — ABNORMAL LOW (ref 13.0–17.0)
Hemoglobin: 8.8 g/dL — ABNORMAL LOW (ref 13.0–17.0)
POTASSIUM: 4.6 mmol/L (ref 3.5–5.1)
POTASSIUM: 5.7 mmol/L — AB (ref 3.5–5.1)
Potassium: 4.5 mmol/L (ref 3.5–5.1)
Potassium: 4.6 mmol/L (ref 3.5–5.1)
Potassium: 4.9 mmol/L (ref 3.5–5.1)
SODIUM: 139 mmol/L (ref 135–145)
SODIUM: 137 mmol/L (ref 135–145)
Sodium: 140 mmol/L (ref 135–145)
Sodium: 129 mmol/L — ABNORMAL LOW (ref 135–145)
Sodium: 133 mmol/L — ABNORMAL LOW (ref 135–145)
TCO2: 28 mmol/L (ref 0–100)
TCO2: 26 mmol/L (ref 0–100)
TCO2: 27 mmol/L (ref 0–100)
TCO2: 26 mmol/L (ref 0–100)
TCO2: 25 mmol/L (ref 0–100)

## 2016-08-11 LAB — GLUCOSE, CAPILLARY
GLUCOSE-CAPILLARY: 92 mg/dL (ref 65–99)
Glucose-Capillary: 123 mg/dL — ABNORMAL HIGH (ref 65–99)
Glucose-Capillary: 94 mg/dL (ref 65–99)
Glucose-Capillary: 95 mg/dL (ref 65–99)
Glucose-Capillary: 96 mg/dL (ref 65–99)

## 2016-08-11 LAB — POCT I-STAT 3, ART BLOOD GAS (G3+)
ACID-BASE DEFICIT: 2 mmol/L (ref 0.0–2.0)
Acid-base deficit: 1 mmol/L (ref 0.0–2.0)
BICARBONATE: 24.2 mmol/L (ref 20.0–28.0)
Bicarbonate: 25 mmol/L (ref 20.0–28.0)
Bicarbonate: 26.7 mmol/L (ref 20.0–28.0)
Bicarbonate: 24 mmol/L (ref 20.0–28.0)
O2 SAT: 100 %
O2 SAT: 98 %
O2 Saturation: 100 %
O2 Saturation: 99 %
PCO2 ART: 43.1 mmHg (ref 32.0–48.0)
PCO2 ART: 51.1 mmHg — AB (ref 32.0–48.0)
PCO2 ART: 40.4 mmHg (ref 32.0–48.0)
PH ART: 7.37 (ref 7.350–7.450)
Patient temperature: 36.2
TCO2: 26 mmol/L (ref 0–100)
TCO2: 28 mmol/L (ref 0–100)
TCO2: 25 mmol/L (ref 0–100)
TCO2: 25 mmol/L (ref 0–100)
pCO2 arterial: 48.1 mmHg — ABNORMAL HIGH (ref 32.0–48.0)
pH, Arterial: 7.326 — ABNORMAL LOW (ref 7.350–7.450)
pH, Arterial: 7.306 — ABNORMAL LOW (ref 7.350–7.450)
pH, Arterial: 7.381 (ref 7.350–7.450)
pO2, Arterial: 458 mmHg — ABNORMAL HIGH (ref 83.0–108.0)
pO2, Arterial: 431 mmHg — ABNORMAL HIGH (ref 83.0–108.0)
pO2, Arterial: 124 mmHg — ABNORMAL HIGH (ref 83.0–108.0)
pO2, Arterial: 139 mmHg — ABNORMAL HIGH (ref 83.0–108.0)

## 2016-08-11 LAB — CBC
HCT: 28.7 % — ABNORMAL LOW (ref 39.0–52.0)
HEMATOCRIT: 29.5 % — AB (ref 39.0–52.0)
HEMOGLOBIN: 9.7 g/dL — AB (ref 13.0–17.0)
Hemoglobin: 9.6 g/dL — ABNORMAL LOW (ref 13.0–17.0)
MCH: 29.9 pg (ref 26.0–34.0)
MCH: 30.5 pg (ref 26.0–34.0)
MCHC: 32.9 g/dL (ref 30.0–36.0)
MCHC: 33.4 g/dL (ref 30.0–36.0)
MCV: 91 fL (ref 78.0–100.0)
MCV: 91.1 fL (ref 78.0–100.0)
Platelets: 132 10*3/uL — ABNORMAL LOW (ref 150–400)
Platelets: 131 10*3/uL — ABNORMAL LOW (ref 150–400)
RBC: 3.24 MIL/uL — AB (ref 4.22–5.81)
RBC: 3.15 MIL/uL — ABNORMAL LOW (ref 4.22–5.81)
RDW: 13.5 % (ref 11.5–15.5)
RDW: 13.6 % (ref 11.5–15.5)
WBC: 12 10*3/uL — AB (ref 4.0–10.5)
WBC: 8.8 10*3/uL (ref 4.0–10.5)

## 2016-08-11 LAB — POCT I-STAT 4, (NA,K, GLUC, HGB,HCT)
Glucose, Bld: 93 mg/dL (ref 65–99)
HCT: 30 % — ABNORMAL LOW (ref 39.0–52.0)
Hemoglobin: 10.2 g/dL — ABNORMAL LOW (ref 13.0–17.0)
POTASSIUM: 4.2 mmol/L (ref 3.5–5.1)
SODIUM: 136 mmol/L (ref 135–145)

## 2016-08-11 LAB — ECHO INTRAOPERATIVE TEE
Height: 68 in
Height: 68 in
Weight: 2388.8 oz
Weight: 2388.8 oz

## 2016-08-11 LAB — HEMOGLOBIN A1C
Hgb A1c MFr Bld: 6 % — ABNORMAL HIGH (ref 4.8–5.6)
Mean Plasma Glucose: 126 mg/dL

## 2016-08-11 LAB — PLATELET COUNT: Platelets: 164 10*3/uL (ref 150–400)

## 2016-08-11 LAB — PROTIME-INR
INR: 1.41
Prothrombin Time: 17.4 seconds — ABNORMAL HIGH (ref 11.4–15.2)

## 2016-08-11 LAB — CREATININE, SERUM
Creatinine, Ser: 1.19 mg/dL (ref 0.61–1.24)
GFR calc Af Amer: >60 mL/min (ref >60)
GFR calc non Af Amer: 57 mL/min — ABNORMAL LOW (ref >60)

## 2016-08-11 LAB — MAGNESIUM: Magnesium: 2.9 mg/dL — ABNORMAL HIGH (ref 1.7–2.4)

## 2016-08-11 LAB — MRSA PCR SCREENING: MRSA by PCR: NEGATIVE

## 2016-08-11 LAB — HEPARIN LEVEL (UNFRACTIONATED): HEPARIN UNFRACTIONATED: 0.24 [IU]/mL — AB (ref 0.30–0.70)

## 2016-08-11 SURGERY — CORONARY ARTERY BYPASS GRAFTING (CABG)
Anesthesia: General | Site: Leg Lower | Laterality: Right

## 2016-08-11 MED ORDER — ARTIFICIAL TEARS OP OINT
TOPICAL_OINTMENT | OPHTHALMIC | Status: AC
  Filled 2016-08-11: qty 3.5

## 2016-08-11 MED ORDER — FENTANYL CITRATE (PF) 250 MCG/5ML IJ SOLN
INTRAMUSCULAR | Status: DC | PRN
  Administered 2016-08-11: 50 ug via INTRAVENOUS
  Administered 2016-08-11: 400 ug via INTRAVENOUS
  Administered 2016-08-11: 50 ug via INTRAVENOUS
  Administered 2016-08-11 (×3): 250 ug via INTRAVENOUS

## 2016-08-11 MED ORDER — HEPARIN SODIUM (PORCINE) 1000 UNIT/ML IJ SOLN
INTRAMUSCULAR | Status: AC
  Filled 2016-08-11: qty 1

## 2016-08-11 MED ORDER — HEMOSTATIC AGENTS (NO CHARGE) OPTIME
TOPICAL | Status: DC | PRN
  Administered 2016-08-11 (×2): 1 via TOPICAL

## 2016-08-11 MED ORDER — SODIUM CHLORIDE 0.9 % IJ SOLN
OROMUCOSAL | Status: DC | PRN
  Administered 2016-08-11: 12 mL via TOPICAL

## 2016-08-11 MED ORDER — FENTANYL CITRATE (PF) 250 MCG/5ML IJ SOLN
INTRAMUSCULAR | Status: AC
  Filled 2016-08-11: qty 25

## 2016-08-11 MED ORDER — EPHEDRINE SULFATE 50 MG/ML IJ SOLN: INTRAMUSCULAR | Status: DC | PRN

## 2016-08-11 MED ORDER — BISACODYL 10 MG RE SUPP: 10.0000 mg | Freq: Every day | RECTAL | Status: DC

## 2016-08-11 MED ORDER — INSULIN ASPART 100 UNIT/ML ~~LOC~~ SOLN
0.0000 [IU] | SUBCUTANEOUS | Status: DC
  Administered 2016-08-11: 2 [IU] via SUBCUTANEOUS

## 2016-08-11 MED ORDER — INSULIN REGULAR BOLUS VIA INFUSION
0.0000 [IU] | Freq: Three times a day (TID) | INTRAVENOUS | Status: DC
  Filled 2016-08-11: qty 10

## 2016-08-11 MED ORDER — MORPHINE SULFATE (PF) 2 MG/ML IV SOLN
2.0000 mg | INTRAVENOUS | Status: DC | PRN
  Administered 2016-08-12: 2 mg via INTRAVENOUS
  Administered 2016-08-12: 4 mg via INTRAVENOUS
  Administered 2016-08-12 – 2016-08-13 (×2): 2 mg via INTRAVENOUS
  Filled 2016-08-11: qty 2
  Filled 2016-08-11 (×3): qty 1

## 2016-08-11 MED ORDER — TRAMADOL HCL 50 MG PO TABS: 50.0000 mg | ORAL_TABLET | ORAL | Status: DC | PRN

## 2016-08-11 MED ORDER — ACETAMINOPHEN 160 MG/5ML PO SOLN: 650.0000 mg | Freq: Once | ORAL | Status: AC

## 2016-08-11 MED ORDER — SODIUM CHLORIDE 0.9 % IV SOLN: INTRAVENOUS | Status: DC

## 2016-08-11 MED ORDER — SODIUM CHLORIDE 0.9% FLUSH
3.0000 mL | Freq: Two times a day (BID) | INTRAVENOUS | Status: DC
  Administered 2016-08-12 – 2016-08-13 (×3): 3 mL via INTRAVENOUS

## 2016-08-11 MED ORDER — PROTAMINE SULFATE 10 MG/ML IV SOLN
INTRAVENOUS | Status: AC
  Filled 2016-08-11: qty 5

## 2016-08-11 MED ORDER — PROTAMINE SULFATE 10 MG/ML IV SOLN
INTRAVENOUS | Status: AC
  Filled 2016-08-11: qty 25

## 2016-08-11 MED ORDER — PROTAMINE SULFATE 10 MG/ML IV SOLN: INTRAVENOUS | Status: DC | PRN

## 2016-08-11 MED ORDER — LACTATED RINGERS IV SOLN: INTRAVENOUS | Status: DC

## 2016-08-11 MED ORDER — VANCOMYCIN HCL IN DEXTROSE 1-5 GM/200ML-% IV SOLN
1000.0000 mg | Freq: Once | INTRAVENOUS | Status: AC
  Administered 2016-08-11: 1000 mg via INTRAVENOUS
  Filled 2016-08-11: qty 200

## 2016-08-11 MED ORDER — ACETAMINOPHEN 500 MG PO TABS
1000.0000 mg | ORAL_TABLET | Freq: Four times a day (QID) | ORAL | Status: DC
  Administered 2016-08-11 – 2016-08-13 (×8): 1000 mg via ORAL
  Filled 2016-08-11 (×8): qty 2

## 2016-08-11 MED ORDER — GLYCOPYRROLATE 0.2 MG/ML IJ SOLN: INTRAMUSCULAR | Status: DC | PRN

## 2016-08-11 MED ORDER — FAMOTIDINE IN NACL 20-0.9 MG/50ML-% IV SOLN
20.0000 mg | Freq: Two times a day (BID) | INTRAVENOUS | Status: AC
  Administered 2016-08-11: 20 mg via INTRAVENOUS

## 2016-08-11 MED ORDER — PHENYLEPHRINE HCL 10 MG/ML IJ SOLN
0.0000 ug/min | INTRAVENOUS | Status: DC
  Administered 2016-08-12: 12 ug/min via INTRAVENOUS
  Filled 2016-08-11 (×3): qty 2

## 2016-08-11 MED ORDER — SODIUM CHLORIDE 0.9 % IJ SOLN
INTRAMUSCULAR | Status: AC
  Filled 2016-08-11: qty 10

## 2016-08-11 MED ORDER — OXYCODONE HCL 5 MG PO TABS
5.0000 mg | ORAL_TABLET | ORAL | Status: DC | PRN
  Administered 2016-08-12 (×2): 5 mg via ORAL
  Filled 2016-08-11 (×2): qty 1

## 2016-08-11 MED ORDER — VECURONIUM BROMIDE 10 MG IV SOLR
INTRAVENOUS | Status: AC
  Filled 2016-08-11: qty 10

## 2016-08-11 MED ORDER — SODIUM CHLORIDE 0.9% FLUSH: 3.0000 mL | INTRAVENOUS | Status: DC | PRN

## 2016-08-11 MED ORDER — CHLORHEXIDINE GLUCONATE CLOTH 2 % EX PADS
6.0000 | MEDICATED_PAD | Freq: Once | CUTANEOUS | Status: AC
  Administered 2016-08-11: 6 via TOPICAL

## 2016-08-11 MED ORDER — DOCUSATE SODIUM 100 MG PO CAPS
200.0000 mg | ORAL_CAPSULE | Freq: Every day | ORAL | Status: DC
  Administered 2016-08-12 – 2016-08-13 (×2): 200 mg via ORAL
  Filled 2016-08-11 (×2): qty 2

## 2016-08-11 MED ORDER — CHLORHEXIDINE GLUCONATE CLOTH 2 % EX PADS: 6.0000 | MEDICATED_PAD | Freq: Once | CUTANEOUS | Status: DC

## 2016-08-11 MED ORDER — SODIUM CHLORIDE 0.9 % IV SOLN
INTRAVENOUS | Status: DC | PRN
  Administered 2016-08-11: 14:00:00 via INTRAVENOUS

## 2016-08-11 MED ORDER — ONDANSETRON HCL 4 MG/2ML IJ SOLN: 4.0000 mg | Freq: Four times a day (QID) | INTRAMUSCULAR | Status: DC | PRN

## 2016-08-11 MED ORDER — CHLORHEXIDINE GLUCONATE 0.12 % MT SOLN
15.0000 mL | OROMUCOSAL | Status: AC
  Administered 2016-08-11: 15 mL via OROMUCOSAL

## 2016-08-11 MED ORDER — ACETAMINOPHEN 160 MG/5ML PO SOLN: 1000.0000 mg | Freq: Four times a day (QID) | ORAL | Status: DC

## 2016-08-11 MED ORDER — SODIUM CHLORIDE 0.9 % IV SOLN: 250.0000 mL | INTRAVENOUS | Status: DC

## 2016-08-11 MED ORDER — METOPROLOL TARTRATE 25 MG/10 ML ORAL SUSPENSION: 12.5000 mg | Freq: Two times a day (BID) | ORAL | Status: DC

## 2016-08-11 MED ORDER — ARTIFICIAL TEARS OP OINT
TOPICAL_OINTMENT | OPHTHALMIC | Status: DC | PRN
  Administered 2016-08-11: 1 via OPHTHALMIC

## 2016-08-11 MED ORDER — GABAPENTIN 600 MG PO TABS
600.0000 mg | ORAL_TABLET | Freq: Three times a day (TID) | ORAL | Status: DC
  Administered 2016-08-12 (×3): 600 mg via ORAL
  Filled 2016-08-11 (×4): qty 1

## 2016-08-11 MED ORDER — SODIUM CHLORIDE 0.9 % IV SOLN
INTRAVENOUS | Status: AC
  Filled 2016-08-11: qty 2.5

## 2016-08-11 MED ORDER — LACTATED RINGERS IV SOLN: 500.0000 mL | Freq: Once | INTRAVENOUS | Status: DC | PRN

## 2016-08-11 MED ORDER — MAGNESIUM SULFATE 4 GM/100ML IV SOLN
4.0000 g | Freq: Once | INTRAVENOUS | Status: AC
  Administered 2016-08-11: 4 g via INTRAVENOUS
  Filled 2016-08-11: qty 100

## 2016-08-11 MED ORDER — DEXTROSE 5 % IV SOLN
1.5000 g | Freq: Two times a day (BID) | INTRAVENOUS | Status: AC
  Administered 2016-08-11 – 2016-08-13 (×4): 1.5 g via INTRAVENOUS
  Filled 2016-08-11 (×5): qty 1.5

## 2016-08-11 MED ORDER — MIDAZOLAM HCL 5 MG/5ML IJ SOLN
INTRAMUSCULAR | Status: DC | PRN
  Administered 2016-08-11: 1 mg via INTRAVENOUS
  Administered 2016-08-11 (×2): 3 mg via INTRAVENOUS
  Administered 2016-08-11: 1 mg via INTRAVENOUS

## 2016-08-11 MED ORDER — METOPROLOL TARTRATE 5 MG/5ML IV SOLN: 2.5000 mg | INTRAVENOUS | Status: DC | PRN

## 2016-08-11 MED ORDER — MIDAZOLAM HCL 2 MG/2ML IJ SOLN: 2.0000 mg | INTRAMUSCULAR | Status: DC | PRN

## 2016-08-11 MED ORDER — PHENYLEPHRINE HCL 10 MG/ML IJ SOLN
INTRAMUSCULAR | Status: DC | PRN
  Administered 2016-08-11: 40 ug via INTRAVENOUS
  Administered 2016-08-11: 80 ug via INTRAVENOUS

## 2016-08-11 MED ORDER — ALBUMIN HUMAN 5 % IV SOLN
250.0000 mL | INTRAVENOUS | Status: AC | PRN
  Administered 2016-08-11 (×2): 250 mL via INTRAVENOUS

## 2016-08-11 MED ORDER — SODIUM CHLORIDE 0.45 % IV SOLN
INTRAVENOUS | Status: DC | PRN
  Administered 2016-08-11: 20 mL/h via INTRAVENOUS

## 2016-08-11 MED ORDER — LACTATED RINGERS IV SOLN
INTRAVENOUS | Status: DC | PRN
  Administered 2016-08-11: 08:00:00 via INTRAVENOUS

## 2016-08-11 MED ORDER — ANTITHROMBIN III (HUMAN) 500 UNITS IV SOLR
551.0000 [IU] | Freq: Once | INTRAVENOUS | Status: DC
  Filled 2016-08-11: qty 20

## 2016-08-11 MED ORDER — PANTOPRAZOLE SODIUM 40 MG PO TBEC
40.0000 mg | DELAYED_RELEASE_TABLET | Freq: Every day | ORAL | Status: DC
  Administered 2016-08-13: 40 mg via ORAL
  Filled 2016-08-11: qty 1

## 2016-08-11 MED ORDER — LIDOCAINE HCL (CARDIAC) 20 MG/ML IV SOLN
INTRAVENOUS | Status: DC | PRN
  Administered 2016-08-11: 60 mg via INTRAVENOUS

## 2016-08-11 MED ORDER — GLYCOPYRROLATE 0.2 MG/ML IJ SOLN
INTRAMUSCULAR | Status: DC | PRN
  Administered 2016-08-11: 0.6 mg via INTRAVENOUS

## 2016-08-11 MED ORDER — VECURONIUM BROMIDE 10 MG IV SOLR
INTRAVENOUS | Status: DC | PRN
  Administered 2016-08-11 (×5): 5 mg via INTRAVENOUS

## 2016-08-11 MED ORDER — DEXMEDETOMIDINE HCL IN NACL 200 MCG/50ML IV SOLN: 0.0000 ug/kg/h | INTRAVENOUS | Status: DC

## 2016-08-11 MED ORDER — MIDAZOLAM HCL 10 MG/2ML IJ SOLN
INTRAMUSCULAR | Status: AC
  Filled 2016-08-11: qty 2

## 2016-08-11 MED ORDER — CHLORHEXIDINE GLUCONATE 0.12 % MT SOLN
15.0000 mL | Freq: Once | OROMUCOSAL | Status: AC
  Administered 2016-08-11: 15 mL via OROMUCOSAL
  Filled 2016-08-11: qty 15

## 2016-08-11 MED ORDER — ROCURONIUM BROMIDE 10 MG/ML (PF) SYRINGE
PREFILLED_SYRINGE | INTRAVENOUS | Status: DC | PRN
  Administered 2016-08-11: 50 mg via INTRAVENOUS

## 2016-08-11 MED ORDER — PROPOFOL 10 MG/ML IV BOLUS
INTRAVENOUS | Status: DC | PRN
  Administered 2016-08-11: 70 mg via INTRAVENOUS
  Administered 2016-08-11: 30 mg via INTRAVENOUS

## 2016-08-11 MED ORDER — ASPIRIN EC 325 MG PO TBEC
325.0000 mg | DELAYED_RELEASE_TABLET | Freq: Every day | ORAL | Status: DC
  Administered 2016-08-12 – 2016-08-13 (×2): 325 mg via ORAL
  Filled 2016-08-11 (×2): qty 1

## 2016-08-11 MED ORDER — CALCIUM CHLORIDE 10 % IV SOLN
INTRAVENOUS | Status: DC | PRN
  Administered 2016-08-11 (×2): 500 mg via INTRAVENOUS

## 2016-08-11 MED ORDER — METOPROLOL TARTRATE 12.5 MG HALF TABLET
12.5000 mg | ORAL_TABLET | Freq: Two times a day (BID) | ORAL | Status: DC
  Administered 2016-08-13: 12.5 mg via ORAL
  Filled 2016-08-11: qty 1

## 2016-08-11 MED ORDER — SODIUM CHLORIDE 0.9 % IV SOLN
30.0000 meq | Freq: Once | INTRAVENOUS | Status: DC
  Filled 2016-08-11: qty 15

## 2016-08-11 MED ORDER — PROTAMINE SULFATE 10 MG/ML IV SOLN
INTRAVENOUS | Status: DC | PRN
  Administered 2016-08-11: 300 mg via INTRAVENOUS

## 2016-08-11 MED ORDER — ACETAMINOPHEN 650 MG RE SUPP
650.0000 mg | Freq: Once | RECTAL | Status: AC
  Administered 2016-08-11: 650 mg via RECTAL

## 2016-08-11 MED ORDER — 0.9 % SODIUM CHLORIDE (POUR BTL) OPTIME
TOPICAL | Status: DC | PRN
  Administered 2016-08-11: 7000 mL

## 2016-08-11 MED ORDER — ASPIRIN 81 MG PO CHEW: 324.0000 mg | CHEWABLE_TABLET | Freq: Every day | ORAL | Status: DC

## 2016-08-11 MED ORDER — NITROGLYCERIN IN D5W 200-5 MCG/ML-% IV SOLN: 0.0000 ug/min | INTRAVENOUS | Status: DC

## 2016-08-11 MED ORDER — NITROGLYCERIN 0.2 MG/ML ON CALL CATH LAB
INTRAVENOUS | Status: DC | PRN
  Administered 2016-08-11: 20 ug via INTRAVENOUS
  Administered 2016-08-11: 40 ug via INTRAVENOUS

## 2016-08-11 MED ORDER — EZETIMIBE 10 MG PO TABS
10.0000 mg | ORAL_TABLET | Freq: Every day | ORAL | Status: DC
  Administered 2016-08-12 – 2016-08-16 (×5): 10 mg via ORAL
  Filled 2016-08-11 (×5): qty 1

## 2016-08-11 MED ORDER — HEPARIN SODIUM (PORCINE) 1000 UNIT/ML IJ SOLN
INTRAMUSCULAR | Status: DC | PRN
  Administered 2016-08-11 (×2): 10000 [IU] via INTRAVENOUS
  Administered 2016-08-11: 30000 [IU] via INTRAVENOUS

## 2016-08-11 MED ORDER — BISACODYL 5 MG PO TBEC
10.0000 mg | DELAYED_RELEASE_TABLET | Freq: Every day | ORAL | Status: DC
  Administered 2016-08-12 – 2016-08-13 (×2): 10 mg via ORAL
  Filled 2016-08-11 (×2): qty 2

## 2016-08-11 MED ORDER — VECURONIUM BROMIDE 10 MG IV SOLR
INTRAVENOUS | Status: AC
Start: 2016-08-11 — End: 2016-08-11
  Filled 2016-08-11: qty 10

## 2016-08-11 MED ORDER — EPHEDRINE SULFATE 50 MG/ML IJ SOLN
INTRAMUSCULAR | Status: DC | PRN
  Administered 2016-08-11: 10 mg via INTRAVENOUS

## 2016-08-11 MED ORDER — ALBUMIN HUMAN 5 % IV SOLN
INTRAVENOUS | Status: DC | PRN
  Administered 2016-08-11 (×3): via INTRAVENOUS

## 2016-08-11 MED ORDER — MORPHINE SULFATE (PF) 2 MG/ML IV SOLN: 1.0000 mg | INTRAVENOUS | Status: AC | PRN

## 2016-08-11 MED ORDER — SUCCINYLCHOLINE CHLORIDE 20 MG/ML IJ SOLN
INTRAMUSCULAR | Status: DC | PRN
  Administered 2016-08-11: 100 mg via INTRAVENOUS

## 2016-08-11 SURGICAL SUPPLY — 64 items
BAG DECANTER FOR FLEXI CONT (MISCELLANEOUS) ×4 IMPLANT
BANDAGE ACE 4X5 VEL STRL LF (GAUZE/BANDAGES/DRESSINGS) ×4 IMPLANT
BANDAGE ACE 6X5 VEL STRL LF (GAUZE/BANDAGES/DRESSINGS) ×4 IMPLANT
BLADE STERNUM SYSTEM 6 (BLADE) ×4 IMPLANT
BLADE SURG 11 STRL SS (BLADE) ×4 IMPLANT
BNDG GAUZE ELAST 4 BULKY (GAUZE/BANDAGES/DRESSINGS) ×4 IMPLANT
CANISTER SUCTION 2500CC (MISCELLANEOUS) ×4 IMPLANT
CATH CPB KIT GERHARDT (MISCELLANEOUS) ×4 IMPLANT
CATH THORACIC 28FR (CATHETERS) ×4 IMPLANT
CRADLE DONUT ADULT HEAD (MISCELLANEOUS) ×4 IMPLANT
DRAIN CHANNEL 28F RND 3/8 FF (WOUND CARE) ×4 IMPLANT
DRAPE CARDIOVASCULAR INCISE (DRAPES) ×1
DRAPE SLUSH/WARMER DISC (DRAPES) ×4 IMPLANT
DRAPE SRG 135X102X78XABS (DRAPES) ×3 IMPLANT
DRSG AQUACEL AG ADV 3.5X14 (GAUZE/BANDAGES/DRESSINGS) ×4 IMPLANT
ELECT BLADE 4.0 EZ CLEAN MEGAD (MISCELLANEOUS) ×4
ELECT REM PT RETURN 9FT ADLT (ELECTROSURGICAL) ×8
ELECTRODE BLDE 4.0 EZ CLN MEGD (MISCELLANEOUS) ×3 IMPLANT
ELECTRODE REM PT RTRN 9FT ADLT (ELECTROSURGICAL) ×6 IMPLANT
FELT TEFLON 1X6 (MISCELLANEOUS) ×8 IMPLANT
GAUZE SPONGE 4X4 12PLY STRL (GAUZE/BANDAGES/DRESSINGS) ×8 IMPLANT
GLOVE BIO SURGEON STRL SZ 6.5 (GLOVE) ×8 IMPLANT
GOWN STRL REUS W/ TWL LRG LVL3 (GOWN DISPOSABLE) ×21 IMPLANT
GOWN STRL REUS W/TWL LRG LVL3 (GOWN DISPOSABLE) ×7
HEMOSTAT POWDER SURGIFOAM 1G (HEMOSTASIS) ×16 IMPLANT
HEMOSTAT SURGICEL 2X14 (HEMOSTASIS) ×4 IMPLANT
KIT BASIN OR (CUSTOM PROCEDURE TRAY) ×4 IMPLANT
KIT CATH SUCT 8FR (CATHETERS) ×4 IMPLANT
KIT ROOM TURNOVER OR (KITS) ×4 IMPLANT
KIT SUCTION CATH 14FR (SUCTIONS) ×8 IMPLANT
KIT VASOVIEW HEMOPRO VH 3000 (KITS) ×4 IMPLANT
LEAD PACING MYOCARDI (MISCELLANEOUS) ×4 IMPLANT
MARKER GRAFT CORONARY BYPASS (MISCELLANEOUS) ×12 IMPLANT
NS IRRIG 1000ML POUR BTL (IV SOLUTION) ×20 IMPLANT
PACK OPEN HEART (CUSTOM PROCEDURE TRAY) ×4 IMPLANT
PAD ARMBOARD 7.5X6 YLW CONV (MISCELLANEOUS) ×8 IMPLANT
PAD ELECT DEFIB RADIOL ZOLL (MISCELLANEOUS) ×4 IMPLANT
PENCIL BUTTON HOLSTER BLD 10FT (ELECTRODE) ×4 IMPLANT
SET CARDIOPLEGIA MPS 5001102 (MISCELLANEOUS) ×4 IMPLANT
SOLUTION ANTI FOG 6CC (MISCELLANEOUS) ×4 IMPLANT
SPONGE GAUZE 4X4 12PLY STER LF (GAUZE/BANDAGES/DRESSINGS) ×8 IMPLANT
SUT BONE WAX W31G (SUTURE) ×4 IMPLANT
SUT MNCRL AB 4-0 PS2 18 (SUTURE) ×4 IMPLANT
SUT PROLENE 3 0 SH1 36 (SUTURE) ×16 IMPLANT
SUT PROLENE 4 0 RB 1 (SUTURE) ×1
SUT PROLENE 4 0 TF (SUTURE) ×8 IMPLANT
SUT PROLENE 4-0 RB1 .5 CRCL 36 (SUTURE) ×3 IMPLANT
SUT PROLENE 6 0 CC (SUTURE) ×16 IMPLANT
SUT PROLENE 7 0 BV1 MDA (SUTURE) ×4 IMPLANT
SUT PROLENE 8 0 BV175 6 (SUTURE) ×28 IMPLANT
SUT STEEL 6MS V (SUTURE) ×4 IMPLANT
SUT STEEL SZ 6 DBL 3X14 BALL (SUTURE) ×4 IMPLANT
SUT VIC AB 1 CTX 18 (SUTURE) ×8 IMPLANT
SUT VIC AB 2-0 CT1 27 (SUTURE) ×1
SUT VIC AB 2-0 CT1 TAPERPNT 27 (SUTURE) ×3 IMPLANT
SUTURE E-PAK OPEN HEART (SUTURE) ×4 IMPLANT
SYSTEM SAHARA CHEST DRAIN ATS (WOUND CARE) ×4 IMPLANT
TAPE CLOTH SURG 4X10 WHT LF (GAUZE/BANDAGES/DRESSINGS) ×4 IMPLANT
TOWEL OR 17X24 6PK STRL BLUE (TOWEL DISPOSABLE) ×8 IMPLANT
TOWEL OR 17X26 10 PK STRL BLUE (TOWEL DISPOSABLE) ×8 IMPLANT
TRAY FOLEY IC TEMP SENS 16FR (CATHETERS) ×4 IMPLANT
TUBING INSUFFLATION (TUBING) ×4 IMPLANT
UNDERPAD 30X30 (UNDERPADS AND DIAPERS) ×4 IMPLANT
WATER STERILE IRR 1000ML POUR (IV SOLUTION) ×8 IMPLANT

## 2016-08-11 NOTE — Brief Op Note (Addendum)
      [address]       [place] [address]             [phone]      08/11/2016  2:47 PM  PATIENT:  Thomas Fuentes  75 y.o. male  PRE-OPERATIVE DIAGNOSIS:  CAD  POST-OPERATIVE DIAGNOSIS: same   PROCEDURE:  Procedure(s): CORONARY ARTERY BYPASS GRAFTING on pump using left internal mammary artery to left anterior descending coronary artery , portion of right greater saphenous vein to right coronary artery, portion of right greater saphenous vein graft to distal circumflex. (N/A) TRANSESOPHAGEAL ECHOCARDIOGRAM (TEE) (N/A) ENDOVEIN HARVEST OF GREATER SAPHENOUS VEIN (Right)    SURGEON:  Surgeon(s) and Role:    * [name], MD - Primary  PHYSICIAN ASSISTANT:  Nicholes Rough, PA-C  ANESTHESIA:   general  EBL:  Total I/O In: 3854 [I.V.:2950; Blood:404; IV Piggyback:500] Out: 1250 [Urine:1250]  BLOOD ADMINISTERED:none  DRAINS: routine   LOCAL MEDICATIONS USED:  NONE  SPECIMEN:  No Specimen  DISPOSITION OF SPECIMEN:  N/A  COUNTS:  YES   DICTATION: .Other Dictation: Dictation Number pending  PLAN OF CARE: Admit to inpatient   PATIENT DISPOSITION:  ICU - intubated and hemodynamically stable.   Delay start of Pharmacological VTE agent (>24hrs) due to surgical blood loss or risk of bleeding: yes

## 2016-08-11 NOTE — Transfer of Care (Signed)
Immediate Anesthesia Transfer of Care Note  Patient: Thomas Fuentes  Procedure(s) Performed: Procedure(s): CORONARY ARTERY BYPASS GRAFTING on pump using left internal mammary artery to left anterior descending coronary artery , portion of right greater saphenous vein to right coronary artery, portion of right greater saphenous vein graft to distal circumflex. (N/A) TRANSESOPHAGEAL ECHOCARDIOGRAM (TEE) (N/A) ENDOVEIN HARVEST OF GREATER SAPHENOUS VEIN (Right)  Patient Location: SICU  Anesthesia Type:General  Level of Consciousness: sedated and Patient remains intubated per anesthesia plan  Airway & Oxygen Therapy: Patient remains intubated per anesthesia plan and Patient placed on Ventilator (see vital sign flow sheet for setting)  Post-op Assessment: Report given to RN and Post -op Vital signs reviewed and stable  Post vital signs: Reviewed and stable  Last Vitals:  Vitals:   08/10/16 2109 08/11/16 0541  BP: (!) 105/53 115/72  Pulse: (!) 54 (!) 52  Resp: 16 16  Temp: 37.1 C 36.5 C    Last Pain:  Vitals:   08/11/16 0541  TempSrc: Oral  PainSc:          Complications: No apparent anesthesia complications

## 2016-08-11 NOTE — H&P (View-Only) (Signed)
Patient ID: Thomas Fuentes, male   DOB: 1941/02/17, 75 y.o.   MRN: [id number]      [address]       [place] [address]             [phone]                   Procedure(s) (LRB): CORONARY ARTERY BYPASS GRAFTING (CABG) (N/A) TRANSESOPHAGEAL ECHOCARDIOGRAM (TEE) (N/A)  LOS: 1 day   Subjective: No chest pain this am  Objective: Vital signs in last 24 hours: Patient Vitals for the past 24 hrs:  BP Temp Temp src Pulse Resp SpO2 Height Weight  08/10/16 0552 107/67 98.3 F (36.8 C) Oral (!) 54 18 95 % - 148 lb 5.9 oz (67.3 kg)  08/09/16 2103 (!) 155/85 98.4 F (36.9 C) Oral 74 18 95 % - -  08/09/16 1736 (!) 156/75 - - - - - - -  08/09/16 1713 (!) 146/86 - - 68 - - - -  08/09/16 1658 (!) 144/76 - - 65 - - - -  08/09/16 1643 (!) 160/85 98.7 F (37.1 C) Oral 65 - 96 % 5\' 8"  (1.727 m) 154 lb 1.6 oz (69.9 kg)    Filed Weights   08/09/16 1643 08/10/16 0552  Weight: 154 lb 1.6 oz (69.9 kg) 148 lb 5.9 oz (67.3 kg)    Hemodynamic parameters for last 24 hours:    Intake/Output from previous day: 12/11 0701 - 12/12 0700 In: 278.8 [P.O.:240; I.V.:38.8] Out: -  Intake/Output this shift: No intake/output data recorded.  Scheduled Meds: . aspirin EC  81 mg Oral Daily  . ezetimibe  10 mg Oral Daily  . fenofibrate  160 mg Oral Daily  . gabapentin  600 mg Oral TID  . metoprolol tartrate  25 mg Oral BID  . multivitamin  1 tablet Oral Daily  . pantoprazole  40 mg Oral Daily  . vitamin E  100 Units Oral Daily   Continuous Infusions: . heparin 1,000 Units/hr (08/10/16 0627)   PRN Meds:.acetaminophen, nitroGLYCERIN, ondansetron (ZOFRAN) IV  General appearance: alert, cooperative and no distress Neurologic: intact Heart: regular rate and rhythm, S1, S2 normal, no murmur, click, rub or gallop Lungs: clear to auscultation bilaterally Abdomen: soft, non-tender; bowel sounds normal; no masses,  no organomegaly Extremities: extremities normal, atraumatic, no  cyanosis or edema and Homans sign is negative, no sign of DVT Wound: right radial cath site ok  Lab Results: CBC: Recent Labs  08/09/16 1255 08/10/16 0346  WBC 6.9 8.2  HGB 13.3 13.1  HCT 39.2* 39.6  PLT 219 206   BMET:  Recent Labs  08/08/16 1842 08/10/16 0346  NA 139 136  K 4.0 4.1  CL 104 103  CO2 28 23  GLUCOSE 87 108*  BUN 27* 19  CREATININE 1.27* 1.20  CALCIUM 9.8 9.8    PT/INR:  Recent Labs  08/10/16 0346  LABPROT 14.1  INR 1.08     Radiology Dg Chest 2 View  Result Date: 08/08/2016 CLINICAL DATA:  Sternal chest pain beginning mid date, history coronary disease post MI and stenting, former smoker, diabetes mellitus, abdominal aortic aneurysm, hypertension EXAM: CHEST  2 VIEW COMPARISON:  01/18/2014 FINDINGS: Enlargement of cardiac silhouette. Mediastinal contours and pulmonary vascularity normal. Bronchitic changes without infiltrate, pleural effusion or pneumothorax. Underlying emphysematous changes. Bones appear demineralized. IMPRESSION: Enlargement of cardiac silhouette. Emphysematous and bronchitic changes likely reflecting COPD. No acute infiltrate. Electronically Signed   By: Elta Guadeloupe  Thornton Papas M.D.   On: 08/08/2016 19:10   Ct Chest Wo Contrast  Result Date: 08/10/2016 CLINICAL DATA:  Chest pain EXAM: CT CHEST WITHOUT CONTRAST TECHNIQUE: Multidetector CT imaging of the chest was performed following the standard protocol without IV contrast. COMPARISON:  Chest radiograph August 08, 2016 FINDINGS: Cardiovascular: The measured transverse diameter of the ascending thoracic aorta is 4.2 x 4.1 cm, measured on axial slice 59 series 2. There are foci of atherosclerotic calcification in the aorta. The visualized great vessels show scattered foci of calcification. Visualized great vessels otherwise appear unremarkable on this noncontrast enhanced study. There are multiple foci of coronary artery calcification. Heart is mildly enlarged. Pericardium is not appreciably  thickened. Mediastinum/Nodes: Visualized thyroid appears unremarkable. There is no appreciable thoracic adenopathy. There is a small hiatal hernia. Lungs/Pleura: There is a degree of underlying centrilobular emphysematous change. There is a mild degree of peripheral fibrosis in the upper lobe regions. There is no edema or consolidation. There is no pleural thickening or pleural effusion evident. Upper Abdomen: In the visualized upper abdomen, there is atherosclerotic calcification in the aorta and major mesenteric vessels. Visualized upper abdominal structures otherwise appear unremarkable. Musculoskeletal: There are no blastic or lytic bone lesions. There is fairly mild degenerative change in the thoracic spine. There is also degenerative change in each shoulder. There is slight endplate concavity along the superior aspect of the T5 vertebral body. IMPRESSION: Ascending thoracic aortic diameter is 4.2 x 4.1 cm. Recommend annual imaging followup by CTA or MRA. This recommendation follows 2010 ACCF/AHA/AATS/ACR/ASA/SCA/SCAI/SIR/STS/SVM Guidelines for the Diagnosis and Management of Patients with Thoracic Aortic Disease. Circulation. 2010; [id number]:5694362. There is atherosclerotic calcification in the aorta as well as multiple foci of coronary artery calcification. Heart is mildly enlarged. Pericardium is not thickened. There is a degree of underlying centrilobular emphysematous change with mild fibrotic change in the periphery of the upper lobes bilaterally. No edema or consolidation. No adenopathy. Electronically Signed   By: Lowella Grip III M.D.   On: 08/10/2016 08:10     Assessment/Plan: S/P Procedure(s) (LRB): CORONARY ARTERY BYPASS GRAFTING (CABG) (N/A) TRANSESOPHAGEAL ECHOCARDIOGRAM (TEE) (N/A) Mild dilation of aorta- see CT results , aortic valve is Aortic valve:   Trileaflet; normal thickness leaflets Renal function stable HgbA1C 5.9 %  Plan CABG as discussed with patient in am The goals  risks and alternatives of the planned surgical procedure CABG have been discussed with the patient in detail. The risks of the procedure including death, infection, stroke, myocardial infarction, bleeding, blood transfusion have all been discussed specifically.  I have quoted Thomas Fuentes a 3 % of perioperative mortality and a complication rate as high as 40 %. The patient's questions have been answered.Thomas Fuentes is willing  to proceed with the planned procedure.  [name] MD 08/10/2016 9:58 AM

## 2016-08-11 NOTE — Progress Notes (Signed)
ABG results (s/p vent rate change) given to Dr Servando Snare, no new orders received.

## 2016-08-11 NOTE — OR Nursing (Signed)
SICU 1st call made at 1403 2nd call to SICU 1437

## 2016-08-11 NOTE — Progress Notes (Signed)
Wean protocol initiated. 

## 2016-08-11 NOTE — Progress Notes (Signed)
  Echocardiogram Echocardiogram Transesophageal has been performed.  Jennette Dubin 08/11/2016, 10:15 AM

## 2016-08-11 NOTE — Progress Notes (Signed)
Set rate increased to 15 d/t ABG results.  RN aware.

## 2016-08-11 NOTE — Anesthesia Procedure Notes (Addendum)
Central Venous Catheter Insertion Performed by: anesthesiologist 08/11/2016 8:00 AM Patient location: Pre-op. Preanesthetic checklist: patient identified, IV checked, site marked, risks and benefits discussed, surgical consent, monitors and equipment checked, pre-op evaluation, timeout performed and anesthesia consent Position: Trendelenburg Lidocaine 1% used for infiltration Landmarks identified and Seldinger technique used Catheter size: 8.5 Fr Central line and PA cath was placed.Sheath introducer Swan type and PA catheter depth:thermodilution and 45PA Cath depth:45 Procedure performed using ultrasound guided technique. Attempts: 1 Following insertion, line sutured and dressing applied. Post procedure assessment: blood return through all ports, free fluid flow and no air. Patient tolerated the procedure well with no immediate complications.

## 2016-08-11 NOTE — Anesthesia Preprocedure Evaluation (Signed)
Anesthesia Evaluation  Patient identified by MRN, date of birth, ID band Patient awake    Reviewed: Allergy & Precautions, NPO status , Patient's Chart, lab work & pertinent test results  Airway Mallampati: II  TM Distance: >3 FB Neck ROM: Full    Dental no notable dental hx.    Pulmonary neg pulmonary ROS, former smoker,    Pulmonary exam normal breath sounds clear to auscultation       Cardiovascular hypertension, + CAD, + Past MI and + Peripheral Vascular Disease  Normal cardiovascular exam Rhythm:Regular Rate:Normal  Left ventricle:  The cavity size was normal. There was mild concentric hypertrophy. Systolic function was normal. The estimated ejection fraction was in the range of 50% to 55%. Wall motion was normal; there were no regional wall motion abnormalities. The transmitral flow pattern was normal. The deceleration time of the early transmitral flow velocity was normal. The pulmonary vein flow pattern was normal. The tissue Doppler parameters were normal. Left ventricular diastolic function parameters were normal.  ------------------------------------------------------------------- Aortic valve:   Trileaflet; normal thickness leaflets. Mobility was not restricted.  Doppler:  Transvalvular velocity was within the normal range. There was no stenosis. There was no regurgitation. Peak velocity ratio of LVOT to aortic valve: 0.83. Valve area (Vmax): 3.15 cm^2. Indexed valve area (Vmax): 1.72 cm^2/m^2. Peak gradient (S): 3 mm Hg.   Neuro/Psych negative neurological ROS  negative psych ROS   GI/Hepatic negative GI ROS, Neg liver ROS,   Endo/Other  negative endocrine ROSdiabetes  Renal/GU negative Renal ROS  negative genitourinary   Musculoskeletal negative musculoskeletal ROS (+)   Abdominal   Peds negative pediatric ROS (+)  Hematology negative hematology ROS (+)   Anesthesia Other Findings   Reproductive/Obstetrics negative OB ROS                             Anesthesia Physical Anesthesia Plan  ASA: III  Anesthesia Plan: General   Post-op Pain Management:    Induction: Intravenous  Airway Management Planned: Oral ETT  Additional Equipment: Arterial line, TEE, Ultrasound Guidance Line Placement, PA Cath and CVP  Intra-op Plan:   Post-operative Plan: Post-operative intubation/ventilation  Informed Consent: I have reviewed the patients History and Physical, chart, labs and discussed the procedure including the risks, benefits and alternatives for the proposed anesthesia with the patient or authorized representative who has indicated his/her understanding and acceptance.   Dental advisory given  Plan Discussed with: CRNA and Surgeon  Anesthesia Plan Comments:         Anesthesia Quick Evaluation

## 2016-08-11 NOTE — Interval H&P Note (Addendum)
History and Physical Interval Note:  08/11/2016 8:21 AM  Thomas Fuentes  has presented today for surgery, with the diagnosis of CAD  The various methods of treatment have been discussed with the patient and family. After consideration of risks, benefits and other options for treatment, the patient has consented to  Procedure(s): CORONARY ARTERY BYPASS GRAFTING (CABG) (N/A) TRANSESOPHAGEAL ECHOCARDIOGRAM (TEE) (N/A) as a surgical intervention .  The patient's history has been reviewed, patient examined, no change in status, stable for surgery.  I have reviewed the patient's chart and labs.  Questions were answered to the patient's satisfaction.   Lab Results  Component Value Date   WBC 8.2 08/10/2016   HGB 13.1 08/10/2016   HCT 39.6 08/10/2016   PLT 206 08/10/2016   GLUCOSE 108 (H) 08/10/2016   CHOL 192 08/09/2016   TRIG 268 (H) 08/09/2016   HDL 27 (L) 08/09/2016   LDLCALC 111 (H) 08/09/2016   ALT 11 (L) 08/10/2016   AST 19 08/10/2016   NA 136 08/10/2016   K 4.1 08/10/2016   CL 103 08/10/2016   CREATININE 1.20 08/10/2016   BUN 19 08/10/2016   CO2 23 08/10/2016   TSH 6.215 (H) 08/09/2016   INR 1.08 08/10/2016   HGBA1C 6.0 (H) 08/10/2016   HGBA1c is elevated slightly, based on this  patient does have DM but has been controlled recently with diet only.   [name] MD      [address] [place] [address] Office [phone]   [place]

## 2016-08-11 NOTE — Anesthesia Procedure Notes (Signed)
Procedure Name: Intubation Date/Time: 08/11/2016 8:46 AM Performed by: Jacquiline Doe A Pre-anesthesia Checklist: Patient identified, Emergency Drugs available, Suction available and Patient being monitored Patient Re-evaluated:Patient Re-evaluated prior to inductionOxygen Delivery Method: Circle System Utilized and Circle system utilized Preoxygenation: Pre-oxygenation with 100% oxygen Intubation Type: IV induction and Cricoid Pressure applied Ventilation: Mask ventilation without difficulty Laryngoscope Size: Mac and 4 Grade View: Grade II Tube type: Oral Tube size: 8.0 mm Number of attempts: 1 Airway Equipment and Method: Stylet Placement Confirmation: ETT inserted through vocal cords under direct vision,  positive ETCO2 and breath sounds checked- equal and bilateral Secured at: 23 cm Tube secured with: Tape Dental Injury: Teeth and Oropharynx as per pre-operative assessment

## 2016-08-11 NOTE — Progress Notes (Signed)
CT surgery p.m. Rounds  Status post CABG Patient awake and responsive on ventilator Undergoing pressure support weaning Hemodynamics stable with sinus rhythm Minimal chest tube drainage 6 hour postop labs are pending

## 2016-08-11 NOTE — Procedures (Signed)
Extubation Procedure Note  Patient Details:   Name: Thomas Fuentes DOB: May 01, 1941 MRN: [id number]   Airway Documentation:     Evaluation  O2 sats: stable throughout Complications: No apparent complications Patient did tolerate procedure well. Bilateral Breath Sounds: Clear, Diminished   Yes   RT extubated pt. Per rapid wean protocol. Pt. Performed -37 on the NIF and 700L on the vital capacity. Pt. Had a positive cuff leak as well. Pt. Also performed 1000x5 on the incentive spirometer. No apparent complications, pt. Placed on 4L nasal cannula.  Marlowe Aschoff 08/11/2016, 9:21 PM

## 2016-08-11 NOTE — Progress Notes (Signed)
[facility] for heparin drip Indication: CAD  Allergies  Allergen Reactions  . Lidocaine Anaphylaxis    novacaine and lidocaine  . Statins Nausea And Vomiting    GI symptoms on almost all statins    Patient Measurements: Height: 5\' 8"  (172.7 cm) Weight: 148 lb 5.9 oz (67.3 kg) IBW/kg (Calculated) : 68.4 Heparin Dosing Weight: 70kg  Vital Signs: Temp: 98.8 F (37.1 C) (12/12 2109) Temp Source: Oral (12/12 2109) BP: 105/53 (12/12 2109) Pulse Rate: 54 (12/12 2109)  Labs:  Recent Labs  08/08/16 1842 08/09/16 0123 08/09/16 0304 08/09/16 0420 08/09/16 0719  08/09/16 1255 08/10/16 0346 08/10/16 1613 08/11/16 0001  HGB 14.2  --   --   --   --   --  13.3 13.1  --   --   HCT 42.3  --   --   --   --   --  39.2* 39.6  --   --   PLT 230  --   --   --   --   --  219 206  --   --   APTT  --   --  35  --   --   --   --   --  41*  --   LABPROT  --   --  14.1  --   --   --   --  14.1  --   --   INR  --   --  1.09  --   --   --   --  1.08  --   --   HEPARINUNFRC  --   --   --   --   --   < > 0.27* <0.10* 0.15* 0.24*  CREATININE 1.27*  --   --   --   --   --   --  1.20  --   --   TROPONINI 0.03* 0.29*  --  0.33* 0.32*  --   --   --   --   --   < > = values in this interval not displayed.  Estimated Creatinine Clearance: 49.1 mL/min (by C-G formula based on SCr of 1.2 mg/dL).  Assessment: 75 y.o. male with CAD awaiting CABG for heparin  Goal of Therapy:  Heparin level 0.3-0.7 units/ml Monitor platelets by anticoagulation protocol: Yes   Plan:  Increase Heparin 1350 units/hr F/U after OR  Phillis Knack, PharmD, BCPS  08/11/2016 12:55 AM

## 2016-08-11 NOTE — Anesthesia Postprocedure Evaluation (Signed)
Anesthesia Post Note  Patient: Thomas Fuentes  Procedure(Fuentes) Performed: Procedure(Fuentes) (LRB): CORONARY ARTERY BYPASS GRAFTING on pump using left internal mammary artery to left anterior descending coronary artery , portion of right greater saphenous vein to right coronary artery, portion of right greater saphenous vein graft to distal circumflex. (N/A) TRANSESOPHAGEAL ECHOCARDIOGRAM (TEE) (N/A) ENDOVEIN HARVEST OF GREATER SAPHENOUS VEIN (Right)  Patient location during evaluation: SICU Anesthesia Type: General Level of consciousness: sedated Pain management: pain level controlled Vital Signs Assessment: post-procedure vital signs reviewed and stable Respiratory status: patient remains intubated per anesthesia plan Cardiovascular status: stable Anesthetic complications: no    Last Vitals:  Vitals:   08/11/16 1800 08/11/16 1815  BP: 115/84   Pulse: 88 89  Resp: 16 15  Temp: 36.8 C 36.9 C    Last Pain:  Vitals:   08/11/16 1515  TempSrc: Core (Comment)  PainSc:                  Thomas Fuentes

## 2016-08-12 ENCOUNTER — Inpatient Hospital Stay (HOSPITAL_COMMUNITY): Payer: Medicare Other

## 2016-08-12 ENCOUNTER — Encounter (HOSPITAL_COMMUNITY): Payer: Self-pay | Admitting: Cardiothoracic Surgery

## 2016-08-12 LAB — CBC
HCT: 29 % — ABNORMAL LOW (ref 39.0–52.0)
HCT: 28.1 % — ABNORMAL LOW (ref 39.0–52.0)
HEMATOCRIT: 28.8 % — AB (ref 39.0–52.0)
HEMOGLOBIN: 9.6 g/dL — AB (ref 13.0–17.0)
Hemoglobin: 9.4 g/dL — ABNORMAL LOW (ref 13.0–17.0)
Hemoglobin: 9.4 g/dL — ABNORMAL LOW (ref 13.0–17.0)
MCH: 30.4 pg (ref 26.0–34.0)
MCH: 30.7 pg (ref 26.0–34.0)
MCH: 29.9 pg (ref 26.0–34.0)
MCHC: 33.1 g/dL (ref 30.0–36.0)
MCHC: 33.5 g/dL (ref 30.0–36.0)
MCHC: 32.6 g/dL (ref 30.0–36.0)
MCV: 91.8 fL (ref 78.0–100.0)
MCV: 91.8 fL (ref 78.0–100.0)
MCV: 91.7 fL (ref 78.0–100.0)
PLATELETS: 136 10*3/uL — AB (ref 150–400)
Platelets: 142 10*3/uL — ABNORMAL LOW (ref 150–400)
Platelets: 131 10*3/uL — ABNORMAL LOW (ref 150–400)
RBC: 3.16 MIL/uL — AB (ref 4.22–5.81)
RBC: 3.06 MIL/uL — ABNORMAL LOW (ref 4.22–5.81)
RBC: 3.14 MIL/uL — ABNORMAL LOW (ref 4.22–5.81)
RDW: 13.6 % (ref 11.5–15.5)
RDW: 13.7 % (ref 11.5–15.5)
RDW: 13.7 % (ref 11.5–15.5)
WBC: 11.6 10*3/uL — AB (ref 4.0–10.5)
WBC: 11.5 10*3/uL — ABNORMAL HIGH (ref 4.0–10.5)
WBC: 14.1 10*3/uL — AB (ref 4.0–10.5)

## 2016-08-12 LAB — CREATININE, SERUM
CREATININE: 1.12 mg/dL (ref 0.61–1.24)
Creatinine, Ser: 1.16 mg/dL (ref 0.61–1.24)
GFR calc Af Amer: >60 mL/min (ref >60)
GFR calc non Af Amer: 59 mL/min — ABNORMAL LOW (ref >60)

## 2016-08-12 LAB — GLUCOSE, CAPILLARY
GLUCOSE-CAPILLARY: 91 mg/dL (ref 65–99)
GLUCOSE-CAPILLARY: 135 mg/dL — AB (ref 65–99)
GLUCOSE-CAPILLARY: 114 mg/dL — AB (ref 65–99)
Glucose-Capillary: 117 mg/dL — ABNORMAL HIGH (ref 65–99)
Glucose-Capillary: 155 mg/dL — ABNORMAL HIGH (ref 65–99)
Glucose-Capillary: 94 mg/dL (ref 65–99)

## 2016-08-12 LAB — BASIC METABOLIC PANEL
ANION GAP: 7 (ref 5–15)
BUN: 15 mg/dL (ref 6–20)
CHLORIDE: 104 mmol/L (ref 101–111)
CO2: 25 mmol/L (ref 22–32)
Calcium: 8.5 mg/dL — ABNORMAL LOW (ref 8.9–10.3)
Creatinine, Ser: 1.14 mg/dL (ref 0.61–1.24)
GFR calc Af Amer: >60 mL/min (ref >60)
GFR calc non Af Amer: >60 mL/min (ref >60)
Glucose, Bld: 112 mg/dL — ABNORMAL HIGH (ref 65–99)
POTASSIUM: 4.3 mmol/L (ref 3.5–5.1)
SODIUM: 136 mmol/L (ref 135–145)

## 2016-08-12 LAB — POCT I-STAT 3, ART BLOOD GAS (G3+)
ACID-BASE DEFICIT: 4 mmol/L — AB (ref 0.0–2.0)
Acid-base deficit: 2 mmol/L (ref 0.0–2.0)
Bicarbonate: 21.5 mmol/L (ref 20.0–28.0)
Bicarbonate: 23.2 mmol/L (ref 20.0–28.0)
O2 SAT: 98 %
O2 SAT: 97 %
PCO2 ART: 40.4 mmHg (ref 32.0–48.0)
PO2 ART: 116 mmHg — AB (ref 83.0–108.0)
PO2 ART: 91 mmHg (ref 83.0–108.0)
Patient temperature: 37.5
TCO2: 23 mmol/L (ref 0–100)
TCO2: 24 mmol/L (ref 0–100)
pCO2 arterial: 39.5 mmHg (ref 32.0–48.0)
pH, Arterial: 7.347 — ABNORMAL LOW (ref 7.350–7.450)
pH, Arterial: 7.369 (ref 7.350–7.450)

## 2016-08-12 LAB — HEMOGLOBIN AND HEMATOCRIT, BLOOD
HCT: 26.4 % — ABNORMAL LOW (ref 39.0–52.0)
Hemoglobin: 8.7 g/dL — ABNORMAL LOW (ref 13.0–17.0)

## 2016-08-12 LAB — POCT I-STAT, CHEM 8
BUN: 18 mg/dL (ref 6–20)
BUN: 18 mg/dL (ref 6–20)
CALCIUM ION: 1.27 mmol/L (ref 1.15–1.40)
CALCIUM ION: 1.19 mmol/L (ref 1.15–1.40)
CREATININE: 1.1 mg/dL (ref 0.61–1.24)
Chloride: 103 mmol/L (ref 101–111)
Chloride: 97 mmol/L — ABNORMAL LOW (ref 101–111)
Creatinine, Ser: 1.2 mg/dL (ref 0.61–1.24)
Glucose, Bld: 125 mg/dL — ABNORMAL HIGH (ref 65–99)
Glucose, Bld: 135 mg/dL — ABNORMAL HIGH (ref 65–99)
HCT: 29 % — ABNORMAL LOW (ref 39.0–52.0)
HEMATOCRIT: 27 % — AB (ref 39.0–52.0)
HEMOGLOBIN: 9.2 g/dL — AB (ref 13.0–17.0)
Hemoglobin: 9.9 g/dL — ABNORMAL LOW (ref 13.0–17.0)
Potassium: 4.4 mmol/L (ref 3.5–5.1)
Potassium: 4.1 mmol/L (ref 3.5–5.1)
SODIUM: 137 mmol/L (ref 135–145)
SODIUM: 134 mmol/L — AB (ref 135–145)
TCO2: 23 mmol/L (ref 0–100)
TCO2: 24 mmol/L (ref 0–100)

## 2016-08-12 LAB — MAGNESIUM
MAGNESIUM: 2.3 mg/dL (ref 1.7–2.4)
MAGNESIUM: 2 mg/dL (ref 1.7–2.4)

## 2016-08-12 MED ORDER — ENOXAPARIN SODIUM 30 MG/0.3ML ~~LOC~~ SOLN
30.0000 mg | Freq: Every day | SUBCUTANEOUS | Status: DC
  Administered 2016-08-12 – 2016-08-15 (×4): 30 mg via SUBCUTANEOUS
  Filled 2016-08-12 (×4): qty 0.3

## 2016-08-12 MED ORDER — INSULIN ASPART 100 UNIT/ML ~~LOC~~ SOLN
0.0000 [IU] | SUBCUTANEOUS | Status: DC
  Administered 2016-08-12 – 2016-08-13 (×4): 2 [IU] via SUBCUTANEOUS
  Administered 2016-08-13: 4 [IU] via SUBCUTANEOUS
  Administered 2016-08-13 (×2): 2 [IU] via SUBCUTANEOUS

## 2016-08-12 MED FILL — Electrolyte-R (PH 7.4) Solution: INTRAVENOUS | Qty: 4000 | Status: AC

## 2016-08-12 MED FILL — Heparin Sodium (Porcine) Inj 1000 Unit/ML: INTRAMUSCULAR | Qty: 30 | Status: AC

## 2016-08-12 MED FILL — Sodium Bicarbonate IV Soln 8.4%: INTRAVENOUS | Qty: 50 | Status: AC

## 2016-08-12 MED FILL — Mannitol IV Soln 20%: INTRAVENOUS | Qty: 500 | Status: AC

## 2016-08-12 MED FILL — Heparin Sodium (Porcine) Inj 1000 Unit/ML: INTRAMUSCULAR | Qty: 10 | Status: AC

## 2016-08-12 MED FILL — Magnesium Sulfate Inj 50%: INTRAMUSCULAR | Qty: 10 | Status: AC

## 2016-08-12 MED FILL — Potassium Chloride Inj 2 mEq/ML: INTRAVENOUS | Qty: 40 | Status: AC

## 2016-08-12 MED FILL — Sodium Chloride IV Soln 0.9%: INTRAVENOUS | Qty: 2000 | Status: AC

## 2016-08-12 NOTE — Progress Notes (Signed)
      [address]       [place] [address]             [phone]      Walking with RN  BP 104/69 (BP Location: Right Arm)   Pulse 67   Temp 98.5 F (36.9 C) (Oral)   Resp 17   Ht 5\' 8"  (1.727 m)   Wt 163 lb 9.3 oz (74.2 kg)   SpO2 98%   BMI 24.87 kg/m    Intake/Output Summary (Last 24 hours) at 08/12/16 1729 Last data filed at 08/12/16 1600  Gross per 24 hour  Intake          2418.98 ml  Output             2605 ml  Net          -186.02 ml   Creatinine 1.16 HCT= 28  Doing well POD # 1  Tee Richeson C. Roxan Hockey, MD [facility] [phone]

## 2016-08-12 NOTE — Progress Notes (Signed)
Patient ID: Thomas Fuentes, male   DOB: Aug 11, 1941, 75 y.o.   MRN: [id number] TCTS DAILY ICU PROGRESS NOTE                   [address]            [place] [address]          [phone]   1 Day Post-Op Procedure(s) (LRB): CORONARY ARTERY BYPASS GRAFTING on pump using left internal mammary artery to left anterior descending coronary artery , portion of right greater saphenous vein to right coronary artery, portion of right greater saphenous vein graft to distal circumflex. (N/A) TRANSESOPHAGEAL ECHOCARDIOGRAM (TEE) (N/A) ENDOVEIN HARVEST OF GREATER SAPHENOUS VEIN (Right)  Total Length of Stay:  LOS: 3 days   Subjective: Awake and alert neuro inatct  Objective: Vital signs in last 24 hours: Temp:  [96.1 F (35.6 C)-100 F (37.8 C)] 100 F (37.8 C) (12/14 0700) Pulse Rate:  [32-96] 88 (12/14 0700) Cardiac Rhythm: Atrial paced (12/14 0500) Resp:  [2-27] 18 (12/14 0700) BP: (81-122)/(56-84) 102/65 (12/14 0700) SpO2:  [95 %-100 %] 96 % (12/14 0700) Arterial Line BP: (82-145)/(47-75) 111/52 (12/14 0700) FiO2 (%):  [40 %-50 %] 40 % (12/13 2100) Weight:  [163 lb 9.3 oz (74.2 kg)] 163 lb 9.3 oz (74.2 kg) (12/14 0442)  Filed Weights   08/10/16 0552 08/11/16 0541 08/12/16 0442  Weight: 148 lb 5.9 oz (67.3 kg) 149 lb 4.8 oz (67.7 kg) 163 lb 9.3 oz (74.2 kg)    Weight change: 14 lb 4.5 oz (6.478 kg)   Hemodynamic parameters for last 24 hours: PAP: (18-42)/(7-28) 25/23 CO:  [3.9 L/min-7.1 L/min] 7.1 L/min CI:  [2.2 L/min/m2-4 L/min/m2] 4 L/min/m2  Intake/Output from previous day: 12/13 0701 - 12/14 0700 In: 7747.7 [P.O.:350; I.V.:4958.7; Blood:404; NG/GT:60; IV Piggyback:1975] Out: [id number] [Urine:3950; Chest Tube:430]  Intake/Output this shift: No intake/output data recorded.  Current Meds: Scheduled Meds: . acetaminophen  1,000 mg Oral Q6H   Or  . acetaminophen (TYLENOL) oral liquid 160 mg/5 mL  1,000 mg Per Tube Q6H  . antithrombin III (human)  551 Units  Intravenous Once  . aspirin EC  325 mg Oral Daily   Or  . aspirin  324 mg Per Tube Daily  . bisacodyl  10 mg Oral Daily   Or  . bisacodyl  10 mg Rectal Daily  . cefUROXime (ZINACEF)  IV  1.5 g Intravenous Q12H  . docusate sodium  200 mg Oral Daily  . ezetimibe  10 mg Oral Daily  . famotidine (PEPCID) IV  20 mg Intravenous Q12H  . gabapentin  600 mg Oral TID  . insulin aspart  0-24 Units Subcutaneous Q4H  . insulin regular  0-10 Units Intravenous TID WC  . metoprolol tartrate  12.5 mg Oral BID   Or  . metoprolol tartrate  12.5 mg Per Tube BID  . [START ON 08/13/2016] pantoprazole  40 mg Oral Daily  . potassium chloride (KCL MULTIRUN) 30 mEq in 265 mL IVPB  30 mEq Intravenous Once  . sodium chloride flush  3 mL Intravenous Q12H   Continuous Infusions: . sodium chloride 20 mL/hr at 08/12/16 0700  . sodium chloride 250 mL (08/12/16 0600)  . sodium chloride 20 mL/hr at 08/12/16 0700  . dexmedetomidine Stopped (08/11/16 1720)  . lactated ringers 20 mL/hr at 08/12/16 0700  . lactated ringers 20 mL/hr at 08/12/16 0700  . nitroGLYCERIN 0 mcg/min (08/11/16 2215)  . phenylephrine (NEO-SYNEPHRINE) Adult infusion  12 mcg/min (08/12/16 0700)   PRN Meds:.sodium chloride, albumin human, lactated ringers, metoprolol, midazolam, morphine injection, ondansetron (ZOFRAN) IV, oxyCODONE, sodium chloride flush, traMADol  General appearance: alert, cooperative and no distress Neurologic: intact Heart: regular rate and rhythm, S1, S2 normal, no murmur, click, rub or gallop Lungs: diminished breath sounds bibasilar Abdomen: soft, non-tender; bowel sounds normal; no masses,  no organomegaly Extremities: extremities normal, atraumatic, no cyanosis or edema and Homans sign is negative, no sign of DVT Wound: sternum stable dressing intact  Lab Results: CBC: Recent Labs  08/11/16 2100 08/11/16 2114 08/12/16 0345  WBC 8.8  --  11.6*  HGB 9.6* 9.2* 9.6*  HCT 28.7* 27.0* 29.0*  PLT 131*  --  142*    BMET:  Recent Labs  08/10/16 0346  08/11/16 2114 08/12/16 0345  NA 136  < > 137 136  K 4.1  < > 4.4 4.3  CL 103  < > 103 104  CO2 23  --   --  25  GLUCOSE 108*  < > 125* 112*  BUN 19  < > 18 15  CREATININE 1.20  < > 1.20 1.14  CALCIUM 9.8  --   --  8.5*  < > = values in this interval not displayed.  CMET: Lab Results  Component Value Date   WBC 11.6 (H) 08/12/2016   HGB 9.6 (L) 08/12/2016   HCT 29.0 (L) 08/12/2016   PLT 142 (L) 08/12/2016   GLUCOSE 112 (H) 08/12/2016   CHOL 192 08/09/2016   TRIG 268 (H) 08/09/2016   HDL 27 (L) 08/09/2016   LDLCALC 111 (H) 08/09/2016   ALT 11 (L) 08/10/2016   AST 19 08/10/2016   NA 136 08/12/2016   K 4.3 08/12/2016   CL 104 08/12/2016   CREATININE 1.14 08/12/2016   BUN 15 08/12/2016   CO2 25 08/12/2016   TSH 6.215 (H) 08/09/2016   INR 1.41 08/11/2016   HGBA1C 6.0 (H) 08/10/2016    PT/INR:  Recent Labs  08/11/16 1520  LABPROT 17.4*  INR 1.41   Radiology: Dg Chest Port 1 View  Result Date: 08/12/2016 CLINICAL DATA:  Chest tube present. Status post coronary artery bypass grafting for coronary artery disease EXAM: PORTABLE CHEST 1 VIEW COMPARISON:  August 11, 2016 FINDINGS: Chest is present on the left, unchanged in position. Endotracheal tube and nasogastric tube have been removed. There is a mediastinal drain, unchanged. Swan-Ganz catheter tip is in the right main pulmonary artery. No pneumothorax. There is patchy airspace consolidation left lower lobe, stable. Lungs elsewhere clear. Heart is mildly enlarged with pulmonary vascularity within normal limits. No adenopathy. No bone lesions. IMPRESSION: Tube and catheter positions as described without pneumothorax. Persistent patchy consolidation left lower lobe. No new opacity. Stable cardiac silhouette. Electronically Signed   By: Lowella Grip III M.D.   On: 08/12/2016 08:00   Dg Chest Port 1 View  Result Date: 08/11/2016 CLINICAL DATA:  Status post CABG.  Assess support  apparatus. EXAM: PORTABLE CHEST 1 VIEW COMPARISON:  Preoperative chest x-ray of May 09, 2016 FINDINGS: The lungs are well-expanded. The interstitial markings are coarse diffusely. There is no pneumothorax. There is no significant pleural effusion. The left lower lobe is somewhat dense. The infrahilar lung markings on the right are coarse. The cardiac silhouette is mildly enlarged. The pulmonary vascularity is not engorged. The sternal wires are intact. There is calcification in the wall of the aortic arch. The left-sided chest tube tip projects in the pulmonary apex laterally.  The mediastinal drain projects at approximately the T5 level. The Swan-Ganz catheter tip projects over the proximal right main pulmonary artery. The endotracheal tube tip lies 2.6 cm above the carina. The esophagogastric tube tip and proximal port lie below the GE junction. IMPRESSION: Post CABG changes. Mild mild pulmonary edema and bibasilar atelectasis. No significant pleural effusion and no pneumothorax. The support tubes are in reasonable position radiographically. Electronically Signed   By: David  Martinique M.D.   On: 08/11/2016 15:27     Assessment/Plan: S/P Procedure(s) (LRB): CORONARY ARTERY BYPASS GRAFTING on pump using left internal mammary artery to left anterior descending coronary artery , portion of right greater saphenous vein to right coronary artery, portion of right greater saphenous vein graft to distal circumflex. (N/A) TRANSESOPHAGEAL ECHOCARDIOGRAM (TEE) (N/A) ENDOVEIN HARVEST OF GREATER SAPHENOUS VEIN (Right) Mobilize Diuresis Diabetes control d/c tubes/lines Continue foley due to strict I&O, patient in ICU and urinary output monitoring See progression orders Cr stable down to 1.1  Expected Acute  Blood - loss Anemia    [name] 08/12/2016 8:03 AM

## 2016-08-13 ENCOUNTER — Inpatient Hospital Stay (HOSPITAL_COMMUNITY): Payer: Medicare Other

## 2016-08-13 ENCOUNTER — Encounter (HOSPITAL_COMMUNITY): Payer: Self-pay | Admitting: *Deleted

## 2016-08-13 DIAGNOSIS — E119 Type 2 diabetes mellitus without complications: Secondary | ICD-10-CM

## 2016-08-13 LAB — GLUCOSE, CAPILLARY
GLUCOSE-CAPILLARY: 127 mg/dL — AB (ref 65–99)
GLUCOSE-CAPILLARY: 189 mg/dL — AB (ref 65–99)
Glucose-Capillary: 104 mg/dL — ABNORMAL HIGH (ref 65–99)
Glucose-Capillary: 124 mg/dL — ABNORMAL HIGH (ref 65–99)
Glucose-Capillary: 136 mg/dL — ABNORMAL HIGH (ref 65–99)
Glucose-Capillary: 146 mg/dL — ABNORMAL HIGH (ref 65–99)

## 2016-08-13 LAB — BASIC METABOLIC PANEL
Anion gap: 7 (ref 5–15)
BUN: 12 mg/dL (ref 6–20)
CO2: 27 mmol/L (ref 22–32)
Calcium: 8.5 mg/dL — ABNORMAL LOW (ref 8.9–10.3)
Chloride: 102 mmol/L (ref 101–111)
Creatinine, Ser: 1.06 mg/dL (ref 0.61–1.24)
GFR calc Af Amer: >60 mL/min (ref >60)
GFR calc non Af Amer: >60 mL/min (ref >60)
Glucose, Bld: 129 mg/dL — ABNORMAL HIGH (ref 65–99)
Potassium: 3.9 mmol/L (ref 3.5–5.1)
Sodium: 136 mmol/L (ref 135–145)

## 2016-08-13 LAB — CBC
HCT: 26.7 % — ABNORMAL LOW (ref 39.0–52.0)
Hemoglobin: 8.8 g/dL — ABNORMAL LOW (ref 13.0–17.0)
MCH: 30.3 pg (ref 26.0–34.0)
MCHC: 33 g/dL (ref 30.0–36.0)
MCV: 92.1 fL (ref 78.0–100.0)
Platelets: 147 10*3/uL — ABNORMAL LOW (ref 150–400)
RBC: 2.9 MIL/uL — ABNORMAL LOW (ref 4.22–5.81)
RDW: 13.7 % (ref 11.5–15.5)
WBC: 11.6 10*3/uL — ABNORMAL HIGH (ref 4.0–10.5)

## 2016-08-13 MED ORDER — AMIODARONE HCL 200 MG PO TABS
200.0000 mg | ORAL_TABLET | Freq: Two times a day (BID) | ORAL | Status: DC
  Administered 2016-08-14 (×2): 200 mg via ORAL
  Filled 2016-08-13 (×3): qty 1

## 2016-08-13 MED ORDER — AMIODARONE HCL IN DEXTROSE 360-4.14 MG/200ML-% IV SOLN: 30.0000 mg/h | INTRAVENOUS | Status: DC

## 2016-08-13 MED ORDER — AMIODARONE HCL IN DEXTROSE 360-4.14 MG/200ML-% IV SOLN
60.0000 mg/h | INTRAVENOUS | Status: DC
  Administered 2016-08-13: 60 mg/h via INTRAVENOUS
  Filled 2016-08-13: qty 200

## 2016-08-13 MED ORDER — GABAPENTIN 600 MG PO TABS
300.0000 mg | ORAL_TABLET | Freq: Three times a day (TID) | ORAL | Status: DC
  Administered 2016-08-13 – 2016-08-16 (×10): 300 mg via ORAL
  Filled 2016-08-13 (×9): qty 1

## 2016-08-13 MED ORDER — AMIODARONE HCL 200 MG PO TABS
200.0000 mg | ORAL_TABLET | Freq: Every day | ORAL | Status: DC
  Administered 2016-08-13: 200 mg via ORAL

## 2016-08-13 MED ORDER — LEVOTHYROXINE SODIUM 25 MCG PO TABS
25.0000 ug | ORAL_TABLET | Freq: Every day | ORAL | Status: DC
  Administered 2016-08-14: 25 ug via ORAL
  Filled 2016-08-13: qty 1

## 2016-08-13 MED ORDER — AMIODARONE HCL IN DEXTROSE 360-4.14 MG/200ML-% IV SOLN
60.0000 mg/h | INTRAVENOUS | Status: AC
  Administered 2016-08-13: 60 mg/h via INTRAVENOUS

## 2016-08-13 MED ORDER — TRAMADOL HCL 50 MG PO TABS: 50.0000 mg | ORAL_TABLET | ORAL | Status: DC | PRN

## 2016-08-13 MED ORDER — ORAL CARE MOUTH RINSE
15.0000 mL | Freq: Two times a day (BID) | OROMUCOSAL | Status: DC
  Administered 2016-08-13 – 2016-08-16 (×4): 15 mL via OROMUCOSAL

## 2016-08-13 MED ORDER — AMIODARONE HCL IN DEXTROSE 360-4.14 MG/200ML-% IV SOLN
30.0000 mg/h | INTRAVENOUS | Status: DC
  Filled 2016-08-13 (×2): qty 200

## 2016-08-13 MED ORDER — OXYCODONE HCL 5 MG PO TABS: 5.0000 mg | ORAL_TABLET | ORAL | Status: DC | PRN

## 2016-08-13 NOTE — Progress Notes (Signed)
Patient ID: Thomas Fuentes, male   DOB: 17-Aug-1941, 75 y.o.   MRN: [id number] TCTS DAILY ICU PROGRESS NOTE                   [address]            [place] [address]          [phone]   2 Days Post-Op Procedure(s) (LRB): CORONARY ARTERY BYPASS GRAFTING on pump using left internal mammary artery to left anterior descending coronary artery , portion of right greater saphenous vein to right coronary artery, portion of right greater saphenous vein graft to distal circumflex. (N/A) TRANSESOPHAGEAL ECHOCARDIOGRAM (TEE) (N/A) ENDOVEIN HARVEST OF GREATER SAPHENOUS VEIN (Right)  Total Length of Stay:  LOS: 4 days   Subjective: Up in chair, feels well, developed afib last started on Amniodrone  Objective: Vital signs in last 24 hours: Temp:  [98.5 F (36.9 C)-99.1 F (37.3 C)] 98.7 F (37.1 C) (12/15 0830) Pulse Rate:  [32-199] 110 (12/15 0830) Cardiac Rhythm: Atrial fibrillation (12/15 0800) Resp:  [12-25] 16 (12/15 0830) BP: (78-127)/(38-83) 95/58 (12/15 0830) SpO2:  [94 %-100 %] 97 % (12/15 0830) Arterial Line BP: (74-142)/(28-67) 133/61 (12/14 1715) Weight:  [163 lb (73.9 kg)] 163 lb (73.9 kg) (12/15 0500)  Filed Weights   08/11/16 0541 08/12/16 0442 08/13/16 0500  Weight: 149 lb 4.8 oz (67.7 kg) 163 lb 9.3 oz (74.2 kg) 163 lb (73.9 kg)    Weight change: -9.3 oz (-0.264 kg)   Hemodynamic parameters for last 24 hours:    Intake/Output from previous day: 12/14 0701 - 12/15 0700 In: 1563.6 [P.O.:390; I.V.:1073.6; IV Piggyback:100] Out: 2595 [Urine:2285; Chest Tube:310]  Intake/Output this shift: Total I/O In: 66.1 [I.V.:66.1] Out: 170 [Urine:150; Chest Tube:20]  Current Meds: Scheduled Meds: . acetaminophen  1,000 mg Oral Q6H   Or  . acetaminophen (TYLENOL) oral liquid 160 mg/5 mL  1,000 mg Per Tube Q6H  . aspirin EC  325 mg Oral Daily   Or  . aspirin  324 mg Per Tube Daily  . bisacodyl  10 mg Oral Daily   Or  . bisacodyl  10 mg Rectal Daily    . cefUROXime (ZINACEF)  IV  1.5 g Intravenous Q12H  . docusate sodium  200 mg Oral Daily  . enoxaparin (LOVENOX) injection  30 mg Subcutaneous QHS  . ezetimibe  10 mg Oral Daily  . gabapentin  300 mg Oral TID  . insulin aspart  0-24 Units Subcutaneous Q4H  . insulin aspart  0-24 Units Subcutaneous Q4H  . metoprolol tartrate  12.5 mg Oral BID   Or  . metoprolol tartrate  12.5 mg Per Tube BID  . pantoprazole  40 mg Oral Daily  . potassium chloride (KCL MULTIRUN) 30 mEq in 265 mL IVPB  30 mEq Intravenous Once  . sodium chloride flush  3 mL Intravenous Q12H   Continuous Infusions: . sodium chloride Stopped (08/12/16 0900)  . sodium chloride 250 mL (08/12/16 0600)  . sodium chloride Stopped (08/12/16 0900)  . amiodarone 60 mg/hr (08/13/16 0800)  . amiodarone    . dexmedetomidine Stopped (08/11/16 1720)  . lactated ringers 20 mL/hr at 08/13/16 0800  . lactated ringers 20 mL/hr at 08/12/16 0800  . nitroGLYCERIN 0 mcg/min (08/11/16 2215)  . phenylephrine (NEO-SYNEPHRINE) Adult infusion 17 mcg/min (08/13/16 0800)   PRN Meds:.sodium chloride, lactated ringers, metoprolol, midazolam, morphine injection, ondansetron (ZOFRAN) IV, oxyCODONE, sodium chloride flush, traMADol  General appearance: alert and  cooperative Neurologic: intact Heart: irregularly irregular rhythm Lungs: diminished breath sounds bibasilar Abdomen: soft, non-tender; bowel sounds normal; no masses,  no organomegaly Extremities: extremities normal, atraumatic, no cyanosis or edema and Homans sign is negative, no sign of DVT Wound: sternum intact  Lab Results: CBC: Recent Labs  08/12/16 1715 08/13/16 0340  WBC 14.1* 11.6*  HGB 9.4* 8.8*  HCT 28.8* 26.7*  PLT 136* 147*   BMET:  Recent Labs  08/12/16 0345  08/12/16 1712 08/12/16 1715 08/13/16 0340  NA 136  --  134*  --  136  K 4.3  --  4.1  --  3.9  CL 104  --  97*  --  102  CO2 25  --   --   --  27  GLUCOSE 112*  --  135*  --  129*  BUN 15  --  18  --   12  CREATININE 1.14  < > 1.10 1.12 1.06  CALCIUM 8.5*  --   --   --  8.5*  < > = values in this interval not displayed.  CMET: Lab Results  Component Value Date   WBC 11.6 (H) 08/13/2016   HGB 8.8 (L) 08/13/2016   HCT 26.7 (L) 08/13/2016   PLT 147 (L) 08/13/2016   GLUCOSE 129 (H) 08/13/2016   CHOL 192 08/09/2016   TRIG 268 (H) 08/09/2016   HDL 27 (L) 08/09/2016   LDLCALC 111 (H) 08/09/2016   ALT 11 (L) 08/10/2016   AST 19 08/10/2016   NA 136 08/13/2016   K 3.9 08/13/2016   CL 102 08/13/2016   CREATININE 1.06 08/13/2016   BUN 12 08/13/2016   CO2 27 08/13/2016   TSH 6.215 (H) 08/09/2016   INR 1.41 08/11/2016   HGBA1C 6.0 (H) 08/10/2016    PT/INR:  Recent Labs  08/11/16 1520  LABPROT 17.4*  INR 1.41   Radiology: Dg Chest Port 1 View  Result Date: 08/13/2016 CLINICAL DATA:  Chest tube present.  Status post CABG. EXAM: PORTABLE CHEST 1 VIEW COMPARISON:  08/12/2016 FINDINGS: Sequelae of recent CABG are again identified. Swan-Ganz catheter has been removed. Right jugular sheath remains in place. Left apical chest tube is unchanged. Mediastinal drain has been removed. Cardiomediastinal silhouette is unchanged. No pneumothorax is identified. There may be a tiny right pleural effusion. Patchy retrocardiac left lower lobe opacity is similar to the prior study. Minimal streaky opacity is present in the right lung base. IMPRESSION: 1. Interval removal of Swan-Ganz catheter and mediastinal drain. Other support devices as above. No pneumothorax. 2. Left greater than right basilar opacities which may reflect atelectasis. Electronically Signed   By: Logan Bores M.D.   On: 08/13/2016 07:40     Assessment/Plan: S/P Procedure(s) (LRB): CORONARY ARTERY BYPASS GRAFTING on pump using left internal mammary artery to left anterior descending coronary artery , portion of right greater saphenous vein to right coronary artery, portion of right greater saphenous vein graft to distal circumflex.  (N/A) TRANSESOPHAGEAL ECHOCARDIOGRAM (TEE) (N/A) ENDOVEIN HARVEST OF GREATER SAPHENOUS VEIN (Right) Mobilize Diabetes control d/c tubes/lines Gabapentin dose decreased so  appropriate for known renal dysfunction TSH elevated even before starting Amiodarone - start synthroid 25 micrograms daily Wean off neo  [name] 08/13/2016 9:06 AM

## 2016-08-13 NOTE — Progress Notes (Signed)
  Amiodarone Drug - Drug Interaction Consult Note  Recommendations: No major drug interactions identified.  Amiodarone is metabolized by the cytochrome P450 system and therefore has the potential to cause many drug interactions. Amiodarone has an average plasma half-life of 50 days (range 20 to 100 days).   There is potential for drug interactions to occur several weeks or months after stopping treatment and the onset of drug interactions may be slow after initiating amiodarone.   []  Statins: Increased risk of myopathy. Simvastatin- restrict dose to 20mg  daily. Other statins: counsel patients to report any muscle pain or weakness immediately.  []  Anticoagulants: Amiodarone can increase anticoagulant effect. Consider warfarin dose reduction. Patients should be monitored closely and the dose of anticoagulant altered accordingly, remembering that amiodarone levels take several weeks to stabilize.  []  Antiepileptics: Amiodarone can increase plasma concentration of phenytoin, the dose should be reduced. Note that small changes in phenytoin dose can result in large changes in levels. Monitor patient and counsel on signs of toxicity.  [x]  Beta blockers: increased risk of bradycardia, AV block and myocardial depression. Sotalol - avoid concomitant use.  []   Calcium channel blockers (diltiazem and verapamil): increased risk of bradycardia, AV block and myocardial depression.  []   Cyclosporine: Amiodarone increases levels of cyclosporine. Reduced dose of cyclosporine is recommended.  []  Digoxin dose should be halved when amiodarone is started.  []  Diuretics: increased risk of cardiotoxicity if hypokalemia occurs.  []  Oral hypoglycemic agents (glyburide, glipizide, glimepiride): increased risk of hypoglycemia. Patient's glucose levels should be monitored closely when initiating amiodarone therapy.   []  Drugs that prolong the QT interval:  Torsades de pointes risk may be increased with concurrent use  - avoid if possible.  Monitor QTc, also keep magnesium/potassium WNL if concurrent therapy can't be avoided. Marland Kitchen Antibiotics: e.g. fluoroquinolones, erythromycin. . Antiarrhythmics: e.g. quinidine, procainamide, disopyramide, sotalol. . Antipsychotics: e.g. phenothiazines, haloperidol.  . Lithium, tricyclic antidepressants, and methadone.  Thank You,  Deboraha Sprang  08/13/2016 10:32 AM

## 2016-08-13 NOTE — Progress Notes (Signed)
MD Roxan Hockey notified of patient in atrial fibrillation rate controlled 80-90s. Patient BP stable on 38mcg of Neo. Orders received. Will continue to monitor patient.  Levon Hedger, RN

## 2016-08-13 NOTE — Progress Notes (Signed)
Patient ID: Thomas Fuentes, male   DOB: Oct 20, 1940, 75 y.o.   MRN: [id number] EVENING ROUNDS NOTE :     [address]       ,[place] [address]             [phone]                 2 Days Post-Op Procedure(s) (LRB): CORONARY ARTERY BYPASS GRAFTING on pump using left internal mammary artery to left anterior descending coronary artery , portion of right greater saphenous vein to right coronary artery, portion of right greater saphenous vein graft to distal circumflex. (N/A) TRANSESOPHAGEAL ECHOCARDIOGRAM (TEE) (N/A) ENDOVEIN HARVEST OF GREATER SAPHENOUS VEIN (Right)  Total Length of Stay:  LOS: 4 days  BP 112/62   Pulse 67   Temp 98.2 F (36.8 C) (Oral)   Resp (!) 22   Ht 5\' 8"  (1.727 m)   Wt 163 lb (73.9 kg)   SpO2 97%   BMI 24.78 kg/m   .Intake/Output      12/14 0701 - 12/15 0700 12/15 0701 - 12/16 0700   P.O. 390    I.V. (mL/kg) 1073.6 (14.5) 458.4 (6.2)   Blood     NG/GT     IV Piggyback 100    Total Intake(mL/kg) 1563.6 (21.2) 458.4 (6.2)   Urine (mL/kg/hr) 2285 (1.3) 625 (0.8)   Chest Tube 310 (0.2) 30 (0)   Total Output 2595 655   Net -1031.4 -196.6          . sodium chloride Stopped (08/12/16 0900)  . sodium chloride 250 mL (08/12/16 0600)  . sodium chloride Stopped (08/12/16 0900)  . amiodarone    . amiodarone 30 mg/hr (08/13/16 1530)  . dexmedetomidine Stopped (08/11/16 1720)  . lactated ringers 20 mL/hr at 08/13/16 0800  . lactated ringers 20 mL/hr at 08/12/16 0800  . nitroGLYCERIN 0 mcg/min (08/11/16 2215)  . phenylephrine (NEO-SYNEPHRINE) Adult infusion 8 mcg/min (08/13/16 1512)     Lab Results  Component Value Date   WBC 11.6 (H) 08/13/2016   HGB 8.8 (L) 08/13/2016   HCT 26.7 (L) 08/13/2016   PLT 147 (L) 08/13/2016   GLUCOSE 129 (H) 08/13/2016   CHOL 192 08/09/2016   TRIG 268 (H) 08/09/2016   HDL 27 (L) 08/09/2016   LDLCALC 111 (H) 08/09/2016   ALT 11 (L) 08/10/2016   AST 19 08/10/2016   NA 136 08/13/2016   K 3.9  08/13/2016   CL 102 08/13/2016   CREATININE 1.06 08/13/2016   BUN 12 08/13/2016   CO2 27 08/13/2016   TSH 6.215 (H) 08/09/2016   INR 1.41 08/11/2016   HGBA1C 6.0 (H) 08/10/2016   Now back in sinus  On po amiodarone   [name] MD  Beeper [phone] Office [phone] 08/13/2016 5:29 PM

## 2016-08-13 NOTE — Care Management Important Message (Signed)
Important Message  Patient Details  Name: Thomas Fuentes MRN: [id number] Date of Birth: 07-03-1941   Medicare Important Message Given:  Yes    Nathen May 08/13/2016, 9:42 AM

## 2016-08-13 NOTE — Care Management Note (Signed)
Case Management Note  Patient Details  Name: Thomas Fuentes MRN: [id number] Date of Birth: July 03, 1941  Subjective/Objective:     Pt is s/p CABG               Action/Plan:   PTA pt independent from home alone.  Pt has already made arrangements to stay with a lady friend post discharge.  CM will continue to follow for discharge needs   Expected Discharge Date:                  Expected Discharge Plan:  Home/Self Care  In-House Referral:     Discharge planning Services  CM Consult  Post Acute Care Choice:    Choice offered to:     DME Arranged:    DME Agency:     HH Arranged:    HH Agency:     Status of Service:  In process, will continue to follow  If discussed at Long Length of Stay Meetings, dates discussed:    Additional Comments:  Maryclare Labrador, RN 08/13/2016, 11:35 AM

## 2016-08-14 ENCOUNTER — Inpatient Hospital Stay (HOSPITAL_COMMUNITY): Payer: Medicare Other

## 2016-08-14 LAB — GLUCOSE, CAPILLARY
GLUCOSE-CAPILLARY: 109 mg/dL — AB (ref 65–99)
GLUCOSE-CAPILLARY: 121 mg/dL — AB (ref 65–99)
GLUCOSE-CAPILLARY: 144 mg/dL — AB (ref 65–99)
Glucose-Capillary: 112 mg/dL — ABNORMAL HIGH (ref 65–99)
Glucose-Capillary: 155 mg/dL — ABNORMAL HIGH (ref 65–99)

## 2016-08-14 LAB — CBC
HCT: 24.7 % — ABNORMAL LOW (ref 39.0–52.0)
Hemoglobin: 8.1 g/dL — ABNORMAL LOW (ref 13.0–17.0)
MCH: 30.3 pg (ref 26.0–34.0)
MCHC: 32.8 g/dL (ref 30.0–36.0)
MCV: 92.5 fL (ref 78.0–100.0)
Platelets: 140 10*3/uL — ABNORMAL LOW (ref 150–400)
RBC: 2.67 MIL/uL — ABNORMAL LOW (ref 4.22–5.81)
RDW: 13.7 % (ref 11.5–15.5)
WBC: 8.8 10*3/uL (ref 4.0–10.5)

## 2016-08-14 LAB — MAGNESIUM: Magnesium: 1.8 mg/dL (ref 1.7–2.4)

## 2016-08-14 LAB — BASIC METABOLIC PANEL
Anion gap: 7 (ref 5–15)
BUN: 16 mg/dL (ref 6–20)
CO2: 26 mmol/L (ref 22–32)
Calcium: 8.5 mg/dL — ABNORMAL LOW (ref 8.9–10.3)
Chloride: 103 mmol/L (ref 101–111)
Creatinine, Ser: 1.06 mg/dL (ref 0.61–1.24)
GFR calc Af Amer: >60 mL/min (ref >60)
GFR calc non Af Amer: >60 mL/min (ref >60)
Glucose, Bld: 111 mg/dL — ABNORMAL HIGH (ref 65–99)
Potassium: 4.1 mmol/L (ref 3.5–5.1)
Sodium: 136 mmol/L (ref 135–145)

## 2016-08-14 MED ORDER — BISACODYL 10 MG RE SUPP: 10.0000 mg | Freq: Every day | RECTAL | Status: DC | PRN

## 2016-08-14 MED ORDER — BISACODYL 5 MG PO TBEC: 10.0000 mg | DELAYED_RELEASE_TABLET | Freq: Every day | ORAL | Status: DC | PRN

## 2016-08-14 MED ORDER — GUAIFENESIN ER 600 MG PO TB12: 600.0000 mg | ORAL_TABLET | Freq: Two times a day (BID) | ORAL | Status: DC | PRN

## 2016-08-14 MED ORDER — SODIUM CHLORIDE 0.9% FLUSH: 3.0000 mL | INTRAVENOUS | Status: DC | PRN

## 2016-08-14 MED ORDER — ONDANSETRON HCL 4 MG/2ML IJ SOLN: 4.0000 mg | Freq: Four times a day (QID) | INTRAMUSCULAR | Status: DC | PRN

## 2016-08-14 MED ORDER — OXYCODONE HCL 5 MG PO TABS: 5.0000 mg | ORAL_TABLET | ORAL | Status: DC | PRN

## 2016-08-14 MED ORDER — ACETAMINOPHEN 325 MG PO TABS
650.0000 mg | ORAL_TABLET | Freq: Four times a day (QID) | ORAL | Status: DC | PRN
Start: 2016-08-14 — End: 2016-08-16

## 2016-08-14 MED ORDER — MOVING RIGHT ALONG BOOK
Freq: Once | Status: AC
  Administered 2016-08-14: 10:00:00
  Filled 2016-08-14: qty 1

## 2016-08-14 MED ORDER — SODIUM CHLORIDE 0.9 % IV SOLN: 250.0000 mL | INTRAVENOUS | Status: DC | PRN

## 2016-08-14 MED ORDER — PANTOPRAZOLE SODIUM 40 MG PO TBEC
40.0000 mg | DELAYED_RELEASE_TABLET | Freq: Every day | ORAL | Status: DC
Start: 2016-08-14 — End: 2016-08-16
  Administered 2016-08-14 – 2016-08-16 (×3): 40 mg via ORAL
  Filled 2016-08-14 (×3): qty 1

## 2016-08-14 MED ORDER — TRAMADOL HCL 50 MG PO TABS: 50.0000 mg | ORAL_TABLET | ORAL | Status: DC | PRN

## 2016-08-14 MED ORDER — ASPIRIN EC 81 MG PO TBEC
81.0000 mg | DELAYED_RELEASE_TABLET | Freq: Every day | ORAL | Status: DC
  Administered 2016-08-14 – 2016-08-16 (×3): 81 mg via ORAL
  Filled 2016-08-14 (×3): qty 1

## 2016-08-14 MED ORDER — DOCUSATE SODIUM 100 MG PO CAPS
200.0000 mg | ORAL_CAPSULE | Freq: Every day | ORAL | Status: DC
  Administered 2016-08-15: 200 mg via ORAL
  Filled 2016-08-14 (×3): qty 2

## 2016-08-14 MED ORDER — INSULIN ASPART 100 UNIT/ML ~~LOC~~ SOLN
0.0000 [IU] | Freq: Three times a day (TID) | SUBCUTANEOUS | Status: DC
  Administered 2016-08-14: 2 [IU] via SUBCUTANEOUS
  Administered 2016-08-14: 4 [IU] via SUBCUTANEOUS

## 2016-08-14 MED ORDER — SODIUM CHLORIDE 0.9% FLUSH
3.0000 mL | Freq: Two times a day (BID) | INTRAVENOUS | Status: DC
  Administered 2016-08-14 – 2016-08-15 (×4): 3 mL via INTRAVENOUS

## 2016-08-14 MED ORDER — ONDANSETRON HCL 4 MG PO TABS: 4.0000 mg | ORAL_TABLET | Freq: Four times a day (QID) | ORAL | Status: DC | PRN

## 2016-08-14 MED ORDER — METOPROLOL TARTRATE 12.5 MG HALF TABLET
12.5000 mg | ORAL_TABLET | Freq: Two times a day (BID) | ORAL | Status: DC
  Administered 2016-08-14 – 2016-08-16 (×4): 12.5 mg via ORAL
  Filled 2016-08-14 (×5): qty 1

## 2016-08-14 NOTE — Progress Notes (Signed)
Patient ID: Thomas Fuentes, male   DOB: [birth year], 75 y.o.   MRN: [id number] TCTS DAILY ICU PROGRESS NOTE                   [address]            [place] [address]          [phone]   3 Days Post-Op Procedure(s) (LRB): CORONARY ARTERY BYPASS GRAFTING on pump using left internal mammary artery to left anterior descending coronary artery , portion of right greater saphenous vein to right coronary artery, portion of right greater saphenous vein graft to distal circumflex. (N/A) TRANSESOPHAGEAL ECHOCARDIOGRAM (TEE) (N/A) ENDOVEIN HARVEST OF GREATER SAPHENOUS VEIN (Right)  Total Length of Stay:  LOS: 5 days   Subjective: Holding sinus, [name]  Objective: Vital signs in last 24 hours: Temp:  [97.8 F (36.6 C)-98.7 F (37.1 C)] 98.4 F (36.9 C) (12/16 0435) Pulse Rate:  [61-110] 61 (12/16 0700) Cardiac Rhythm: Normal sinus rhythm (12/16 0400) Resp:  [13-25] 23 (12/16 0700) BP: (83-130)/(56-88) 88/58 (12/16 0700) SpO2:  [91 %-100 %] 98 % (12/16 0700) Weight:  [161 lb 8 oz (73.3 kg)] 161 lb 8 oz (73.3 kg) (12/16 0500)  Filed Weights   08/12/16 0442 08/13/16 0500 08/14/16 0500  Weight: 163 lb 9.3 oz (74.2 kg) 163 lb (73.9 kg) 161 lb 8 oz (73.3 kg)    Weight change: -1 lb 8 oz (-0.68 kg)   Hemodynamic parameters for last 24 hours:    Intake/Output from previous day: 12/15 0701 - 12/16 0700 In: 930.7 [I.V.:930.7] Out: 1405 [Urine:1375; Chest Tube:30]  Intake/Output this shift: No intake/output data recorded.  Current Meds: Scheduled Meds: . acetaminophen  1,000 mg Oral Q6H   Or  . acetaminophen (TYLENOL) oral liquid 160 mg/5 mL  1,000 mg Per Tube Q6H  . amiodarone  200 mg Oral Q12H   Followed by  . [START ON 08/21/2016] amiodarone  200 mg Oral Daily  . aspirin EC  325 mg Oral Daily   Or  . aspirin  324 mg Per Tube Daily  . bisacodyl  10 mg Oral Daily   Or  . bisacodyl  10 mg Rectal Daily  . docusate sodium  200 mg Oral Daily    . enoxaparin (LOVENOX) injection  30 mg Subcutaneous QHS  . ezetimibe  10 mg Oral Daily  . gabapentin  300 mg Oral TID  . insulin aspart  0-24 Units Subcutaneous Q4H  . insulin aspart  0-24 Units Subcutaneous Q4H  . levothyroxine  25 mcg Oral QAC breakfast  . mouth rinse  15 mL Mouth Rinse BID  . metoprolol tartrate  12.5 mg Oral BID   Or  . metoprolol tartrate  12.5 mg Per Tube BID  . pantoprazole  40 mg Oral Daily  . potassium chloride (KCL MULTIRUN) 30 mEq in 265 mL IVPB  30 mEq Intravenous Once  . sodium chloride flush  3 mL Intravenous Q12H   Continuous Infusions: . sodium chloride Stopped (08/12/16 0900)  . sodium chloride 250 mL (08/12/16 0600)  . sodium chloride Stopped (08/12/16 0900)  . amiodarone    . amiodarone Stopped (08/13/16 1854)  . dexmedetomidine Stopped (08/11/16 1720)  . lactated ringers 20 mL/hr at 08/13/16 2000  . lactated ringers 20 mL/hr at 08/12/16 0800  . nitroGLYCERIN 0 mcg/min (08/11/16 2215)  . phenylephrine (NEO-SYNEPHRINE) Adult infusion Stopped (08/13/16 1837)   PRN Meds:.sodium chloride, lactated ringers,  metoprolol, midazolam, morphine injection, ondansetron (ZOFRAN) IV, oxyCODONE, sodium chloride flush, traMADol  General appearance: alert, cooperative and no distress Neurologic: intact Heart: regular rate and rhythm, S1, S2 normal, no murmur, click, rub or gallop Lungs: clear to auscultation bilaterally Abdomen: soft, non-tender; bowel sounds normal; no masses,  no organomegaly Extremities: extremities normal, atraumatic, no cyanosis or edema and Homans sign is negative, no sign of DVT Wound: sternum intact  Lab Results: CBC: Recent Labs  08/13/16 0340 08/14/16 0444  WBC 11.6* 8.8  HGB 8.8* 8.1*  HCT 26.7* 24.7*  PLT 147* 140*   BMET:  Recent Labs  08/13/16 0340 08/14/16 0444  NA 136 136  K 3.9 4.1  CL 102 103  CO2 27 26  GLUCOSE 129* 111*  BUN 12 16  CREATININE 1.06 1.06  CALCIUM 8.5* 8.5*    CMET: Lab Results   Component Value Date   WBC 8.8 08/14/2016   HGB 8.1 (L) 08/14/2016   HCT 24.7 (L) 08/14/2016   PLT 140 (L) 08/14/2016   GLUCOSE 111 (H) 08/14/2016   CHOL 192 08/09/2016   TRIG 268 (H) 08/09/2016   HDL 27 (L) 08/09/2016   LDLCALC 111 (H) 08/09/2016   ALT 11 (L) 08/10/2016   AST 19 08/10/2016   NA 136 08/14/2016   K 4.1 08/14/2016   CL 103 08/14/2016   CREATININE 1.06 08/14/2016   BUN 16 08/14/2016   CO2 26 08/14/2016   TSH 6.215 (H) 08/09/2016   INR 1.41 08/11/2016   HGBA1C 6.0 (H) 08/10/2016    PT/INR:  Recent Labs  08/11/16 1520  LABPROT 17.4*  INR 1.41   Radiology: Dg Chest Port 1 View  Result Date: 08/14/2016 CLINICAL DATA:  Chest tube EXAM: PORTABLE CHEST 1 VIEW COMPARISON:  08/13/2016 FINDINGS: Interval removal of left chest tube. No pneumothorax. There is cardiomegaly with left base atelectasis. Right lung is clear. No effusions. IMPRESSION: Interval removal of left chest tube without pneumothorax. Left base atelectasis. Electronically Signed   By: Rolm Baptise M.D.   On: 08/14/2016 07:47     Assessment/Plan: S/P Procedure(s) (LRB): CORONARY ARTERY BYPASS GRAFTING on pump using left internal mammary artery to left anterior descending coronary artery , portion of right greater saphenous vein to right coronary artery, portion of right greater saphenous vein graft to distal circumflex. (N/A) TRANSESOPHAGEAL ECHOCARDIOGRAM (TEE) (N/A) ENDOVEIN HARVEST OF GREATER SAPHENOUS VEIN (Right) Mobilize Diuresis Diabetes control Plan for transfer to step-down: see transfer orders   Thomas Fuentes 08/14/2016 8:18 AM

## 2016-08-15 ENCOUNTER — Inpatient Hospital Stay (HOSPITAL_COMMUNITY): Payer: Medicare Other

## 2016-08-15 LAB — BASIC METABOLIC PANEL
Anion gap: 8 (ref 5–15)
BUN: 17 mg/dL (ref 6–20)
CO2: 26 mmol/L (ref 22–32)
Calcium: 8.9 mg/dL (ref 8.9–10.3)
Chloride: 103 mmol/L (ref 101–111)
Creatinine, Ser: 1.13 mg/dL (ref 0.61–1.24)
GFR calc Af Amer: >60 mL/min (ref >60)
GFR calc non Af Amer: >60 mL/min (ref >60)
Glucose, Bld: 114 mg/dL — ABNORMAL HIGH (ref 65–99)
Potassium: 4.1 mmol/L (ref 3.5–5.1)
Sodium: 137 mmol/L (ref 135–145)

## 2016-08-15 LAB — CBC
HCT: 26.7 % — ABNORMAL LOW (ref 39.0–52.0)
Hemoglobin: 8.5 g/dL — ABNORMAL LOW (ref 13.0–17.0)
MCH: 29.3 pg (ref 26.0–34.0)
MCHC: 31.8 g/dL (ref 30.0–36.0)
MCV: 92.1 fL (ref 78.0–100.0)
Platelets: 210 10*3/uL (ref 150–400)
RBC: 2.9 MIL/uL — ABNORMAL LOW (ref 4.22–5.81)
RDW: 13.5 % (ref 11.5–15.5)
WBC: 8.5 10*3/uL (ref 4.0–10.5)

## 2016-08-15 LAB — GLUCOSE, CAPILLARY: GLUCOSE-CAPILLARY: 114 mg/dL — AB (ref 65–99)

## 2016-08-15 MED ORDER — AMIODARONE HCL 200 MG PO TABS
200.0000 mg | ORAL_TABLET | Freq: Every day | ORAL | Status: DC
  Administered 2016-08-15 – 2016-08-16 (×2): 200 mg via ORAL
  Filled 2016-08-15 (×2): qty 1

## 2016-08-15 MED ORDER — LEVOTHYROXINE SODIUM 25 MCG PO TABS
25.0000 ug | ORAL_TABLET | Freq: Every day | ORAL | Status: DC
  Administered 2016-08-15 – 2016-08-16 (×2): 25 ug via ORAL
  Filled 2016-08-15 (×2): qty 1

## 2016-08-15 MED ORDER — POTASSIUM CHLORIDE CRYS ER 20 MEQ PO TBCR
20.0000 meq | EXTENDED_RELEASE_TABLET | Freq: Every day | ORAL | Status: DC
  Administered 2016-08-15 – 2016-08-16 (×2): 20 meq via ORAL
  Filled 2016-08-15 (×2): qty 1

## 2016-08-15 MED ORDER — FUROSEMIDE 40 MG PO TABS
40.0000 mg | ORAL_TABLET | Freq: Every day | ORAL | Status: DC
  Administered 2016-08-15 – 2016-08-16 (×2): 40 mg via ORAL
  Filled 2016-08-15 (×2): qty 1

## 2016-08-15 NOTE — Progress Notes (Addendum)
      [address]       [place] [address]             [phone]        4 Days Post-Op Procedure(s) (LRB): CORONARY ARTERY BYPASS GRAFTING on pump using left internal mammary artery to left anterior descending coronary artery , portion of right greater saphenous vein to right coronary artery, portion of right greater saphenous vein graft to distal circumflex. (N/A) TRANSESOPHAGEAL ECHOCARDIOGRAM (TEE) (N/A) ENDOVEIN HARVEST OF GREATER SAPHENOUS VEIN (Right)  Subjective: Patient just finishing breakfast. He had a lot of questions, which I answered.  Objective: Vital signs in last 24 hours: Temp:  [97.6 F (36.4 C)-99 F (37.2 C)] 98 F (36.7 C) (12/17 0643) Pulse Rate:  [59-80] 70 (12/17 0643) Cardiac Rhythm: Normal sinus rhythm (12/16 2030) Resp:  [12-24] 18 (12/17 0643) BP: (74-137)/(53-97) 128/83 (12/17 0643) SpO2:  [97 %-100 %] 97 % (12/17 0643) Weight:  [156 lb 14.4 oz (71.2 kg)] 156 lb 14.4 oz (71.2 kg) (12/17 0643)  Pre op weight 67.7 kg Current Weight  08/15/16 156 lb 14.4 oz (71.2 kg)       Intake/Output from previous day: 12/16 0701 - 12/17 0700 In: 483 [P.O.:480; I.V.:3] Out: 1600 [Urine:1600]   Physical Exam:  Cardiovascular: RRR Pulmonary: Clear to auscultation bilaterally Abdomen: Soft, non tender, bowel sounds present. Extremities: Mild bilateral lower extremity edema R>L Wounds: Clean and dry.  No erythema or signs of infection.  Lab Results: CBC: Recent Labs  08/14/16 0444 08/15/16 0300  WBC 8.8 8.5  HGB 8.1* 8.5*  HCT 24.7* 26.7*  PLT 140* 210   BMET:  Recent Labs  08/14/16 0444 08/15/16 0300  NA 136 137  K 4.1 4.1  CL 103 103  CO2 26 26  GLUCOSE 111* 114*  BUN 16 17  CREATININE 1.06 1.13  CALCIUM 8.5* 8.9    PT/INR:  Lab Results  Component Value Date   INR 1.41 08/11/2016   INR 1.08 08/10/2016   INR 1.09 08/09/2016   ABG:  INR: Will add last result for INR, ABG once components are  confirmed Will add last 4 CBG results once components are confirmed  Assessment/Plan:  1. CV - S/p NSTEMI. SR/first degree heart block with HR in the 60's. On Amiodarone 200 mg bid, Lopressor 12.5 mg bid. Will decrease Amiodarone to 200 mg daily. 2.  Pulmonary - On 3 liters of oxygen via [place]. Wean to room air as tolerates. CXR this am appears to show no pneumothorax, cardiomegaly, left base atelectasis, and small pleural effusions. Encourage incentive spirometer. 3. Volume Overload - Start Lasix 40 mg daily 4.  Acute blood loss anemia - H and H stable at 8.5 and 26.7 5. Mild thrombocytopenia resolved-platelets up to 210,000 6. CBGs 121/144/114. Pre op HGA1C 6. He appears to be diet controlled. He will require further surveillance of HGA1C as outpatient. Continue carb mod diet but stop accu checks and SS PRN. 7. Remove EPW 8. TSH upon admission 6.215. Patient with history of hypothyroidism. He was started on  Synthroid this admission .He will need to follow up with his medical doctor after discharge. 9. Possibly discharge tomorrow   ZIMMERMAN,DONIELLE MPA-C 08/15/2016,7:51 AM  I have seen and examined Thomas Fuentes and agree with the above assessment  and plan.  [name] MD Beeper [phone] Office [phone] 08/15/2016 12:14 PM

## 2016-08-15 NOTE — Discharge Instructions (Signed)
Coronary Artery Bypass Grafting, Care After °These instructions give you information on caring for yourself after your procedure. Your doctor may also give you more specific instructions. Call your doctor if you have any problems or questions after your procedure. °Follow these instructions at home: °· Only take medicine as told by your doctor. Take medicines exactly as told. Do not stop taking medicines or start any new medicines without talking to your doctor first. °· Take your pulse as told by your doctor. °· Do deep breathing as told by your doctor. Use your breathing device (incentive spirometer), if given, to practice deep breathing several times a day. Support your chest with a pillow or your arms when you take deep breaths or cough. °· Keep the area clean, dry, and protected where the surgery cuts (incisions) were made. Remove bandages (dressings) only as told by your doctor. If strips were applied to surgical area, do not take them off. They fall off on their own. °· Check the surgery area daily for puffiness (swelling), redness, or leaking fluid. °· If surgery cuts were made in your legs: °? Avoid crossing your legs. °? Avoid sitting for long periods of time. Change positions every 30 minutes. °? Raise your legs when you are sitting. Place them on pillows. °· Wear stockings that help keep blood clots from forming in your legs (compression stockings). °· Only take sponge baths until your doctor says it is okay to take showers. Pat the surgery area dry. Do not rub the surgery area with a washcloth or towel. Do not bathe, swim, or use a hot tub until your doctor says it is okay. °· Eat foods that are high in fiber. These include raw fruits and vegetables, whole grains, beans, and nuts. Choose lean meats. Avoid canned, processed, and fried foods. °· Drink enough fluids to keep your pee (urine) clear or pale yellow. °· Weigh yourself every day. °· Rest and limit activity as told by your doctor. You may be told  to: °? Stop any activity if you have chest pain, shortness of breath, changes in heartbeat, or dizziness. Get help right away if this happens. °? Move around often for short amounts of time or take short walks as told by your doctor. Gradually become more active. You may need help to strengthen your muscles and build endurance. °? Avoid lifting, pushing, or pulling anything heavier than 10 pounds (4.5 kg) for at least 6 weeks after surgery. °· Do not drive until your doctor says it is okay. °· Ask your doctor when you can go back to work. °· Ask your doctor when you can begin sexual activity again. °· Follow up with your doctor as told. °Contact a doctor if: °· You have puffiness, redness, more pain, or fluid draining from the incision site. °· You have a fever. °· You have puffiness in your ankles or legs. °· You have pain in your legs. °· You gain 2 or more pounds (0.9 kg) a day. °· You feel sick to your stomach (nauseous) or throw up (vomit). °· You have watery poop (diarrhea). °Get help right away if: °· You have chest pain that goes to your jaw or arms. °· You have shortness of breath. °· You have a fast or irregular heartbeat. °· You notice a "clicking" in your breastbone when you move. °· You have numbness or weakness in your arms or legs. °· You feel dizzy or light-headed. °This information is not intended to replace advice given to you by   your health care provider. Make sure you discuss any questions you have with your health care provider. °Document Released: 08/21/2013 Document Revised: 01/22/2016 Document Reviewed: 01/23/2013 °Elsevier Interactive Patient Education © 2017 [employer] ° °

## 2016-08-15 NOTE — Progress Notes (Signed)
Epicardial wires have been removed and were intact. Patient has been instructed to remain in bed for one hour. Vitals will be taken every 15 minutes. Will continue to monitor.

## 2016-08-15 NOTE — Discharge Summary (Signed)
Physician Discharge Summary       [address]       [place] [address]             [phone]    Patient ID: OLUWADEMILADE RICCITELLI MRN: [id number] DOB/AGE: Oct 30, 1940 75 y.o.  Admit date: 08/09/2016 Discharge date: 08/16/2016  Admission Diagnoses: 1. S/p NSTEMI (non-ST elevated myocardial infarction) ([place]) 2. Coronary artery disease  Active Diagnoses:  1. Hyperlipidemia 2. Hypertension 3. CKD (chronic kidney disease), stage III 4. AAA (abdominal aortic aneurysm) ([place]) 5. Hypothyroidism 6. Diabetes mellitus ([place]) 7. Tobacco abuse 8. Metabolic syndrome 9. Peripheral neuropathy ([place]) 10. Peripheral vascular disease ([place]) 11. Macular degeneration-bilateraly 12. ABL anemia 13. Post op atrial fibrillation  Procedure (s):  Left Heart Cath and Coronary Angiography by Dr. Fletcher Anon on 08/09/2016:  Conclusion     There is no aortic valve stenosis.  Prox RCA lesion, 99 %stenosed.  Mid RCA lesion, 60 %stenosed.  RPDA lesion, 60 %stenosed.  Ost 2nd Mrg to 2nd Mrg lesion, 50 %stenosed.  Ost 3rd Mrg lesion, 85 %stenosed.  Ost LAD to Prox LAD lesion, 30 %stenosed.  Mid LAD to Dist LAD lesion, 99 %stenosed.   1. Severe three-vessel coronary artery disease with severe in-stent restenosis in both LAD and RCA. 2. Left ventricular angiography was not performed due to chronic kidney disease. EF was 50% by echo. LVEDP was moderately elevated.  Recommendations: I recommend CABG for revascularization. Resume heparin 8 hours after sheath pull. We'll transfer the patient to [facility] for evaluation.     CORONARY ARTERY BYPASS GRAFTING on pump using left internal mammary artery to left anterior descending coronary artery , portion of right greater saphenous vein to right coronary artery, portion of right greater saphenous vein graft to distal circumflex.TRANSESOPHAGEAL ECHOCARDIOGRAM (TEE)  ENDOVEIN HARVEST OF GREATER SAPHENOUS VEIN (Right) by Dr. Servando Snare  on 08/11/2016.  History of Presenting Illness: This is a 75 y.o.malewith known CAD (s/p MI and PCI/stenting in 2000).  He has history of CKD stage III, HTN, HLD, AAA measuring 3.2 cm in 2015, and hypothyroidism. He has been told he has DM but says he does not.  He  presented to [facility] on 12/10 with  substernal chest pain c/w prior MI and ruled in with a current troponin of 0.33. In  2015, he had a nuclear stress test that was without evidence of ischemia. He has been doing well since he was last seen in the clinic in 10/2015. He works outside. He was in his usual state of health until the afternoon of 12/10 when he developed substernal chest pain/pressure that was intermittent. It was associated with burping. Pain worse when sitting up, leaning forward. Nothing made the pain worse and it would improve on its own. No associated SOB, nausea, vomiting, diaphoresis, dizziness, presyncope, or syncope. He checked his BP at home and found this to be [id number] systolic. Because of his pain and elevated BP, he presented to [facility]. Upon the patient's arrival to [facility], they were found to have troponin 0.03-->0.29-->0.33.   He was started on a heparin gttp. He received full-dose ASA in the ED.  TSH elevated at 6.215. ECG showed NSR, 68 bpm, no acute st/t changes, CXR showed no acute infiltrate. A cardiology  consult was requested ,he was chest pain free. He underwent echo prior to cardiac cath to evaluate EF given his CKD that showed an EF of 50-55%, no RWMA, LV diastolic function was normal, aortic root 42 mm, mild MR.  Cath done 08/09/2016 showed severe 3-vessel CAD with severe ISR in both the LAD and RCA. LV gram was not performed . LVEDP was moderately elevated. It was  transferred to [facility] for evaluation of possible cardiac bypass surgery.  Dr. Servando Snare was consulted for consideratio of coronary artery bypass grafting surgery. Potential risks, benefits, and complications of the surgery were discussed with the patient and he  agreed to proceed with surgery. Pre operative carotid duplex US showed no significant internal carotid artery stenosis bilaterally.  Brief Hospital Course:  The patient was extubated the evening of surgery without difficulty. He/she remained afebrile and hemodynamically stable. Gordy Councilman, a line, chest tubes, and foley were removed early in the post operative course. Lopressor was started and titrated accordingly. He went into a fib. He was started on Amiodarone. He converted to sinus rhythm. He was volume over loaded and diuresed. He/she had ABL anemia. He did not require a post op transfusion. Last H and H was 8.5 and 26.7. He was weaned off the insulin drip. The patient's HGA1C pre op was 6. He requires further surveillance of his HGA1C as an outpatient.  The patient was felt surgically stable for transfer from the ICU to [facility] for further convalescence on 08/14/2016. He continues to progress with cardiac rehab. He is ambulating on 3 liters of oxygen via [place]. We will attempt to wean him to room air. He has been tolerating a diet and has had a bowel movement. Of note, TSH upon admission was 6.215. He was put on Synthroid 25 mcg daily. He will need to follow up with his medical doctor after discharge. Epicardial pacing wires were removed on 08/15/2016. Chest tube sutures will be removed the day of discharge. The patient is felt surgically stable for discharge today.    Latest Vital Signs: Blood pressure 107/66, pulse 68, temperature 98.6 F (37 C), temperature source Oral, resp. rate 18, height 5\' 8"  (1.727 m), weight 151 lb 9.6 oz (68.8 kg), SpO2 94 %.  Physical Exam: Cardiovascular: RRR Pulmonary: Clear to auscultation bilaterally Abdomen: Soft, non tender, bowel sounds present. Extremities: Mild bilateral lower extremity edema R>L Wounds: Clean and dry.  No erythema or signs of infection.  Discharge Condition:Stable and discharged to home.  Recent laboratory studies:  Lab Results  Component  Value Date   WBC 8.5 08/15/2016   HGB 8.5 (L) 08/15/2016   HCT 26.7 (L) 08/15/2016   MCV 92.1 08/15/2016   PLT 210 08/15/2016   Lab Results  Component Value Date   NA 137 08/15/2016   K 4.1 08/15/2016   CL 103 08/15/2016   CO2 26 08/15/2016   CREATININE 1.13 08/15/2016   GLUCOSE 114 (H) 08/15/2016    Diagnostic Studies: Dg Chest 2 View  Result Date: 08/15/2016 CLINICAL DATA:  Postop EXAM: CHEST  2 VIEW COMPARISON:  08/14/2016 FINDINGS: Prior CABG. Cardiomegaly. Small bilateral pleural effusions. No edema or confluent airspace opacities. IMPRESSION: Cardiomegaly.  Small bilateral pleural effusions. Electronically Signed   By: Rolm Baptise M.D.   On: 08/15/2016 08:09    Ct Chest Wo Contrast  Result Date: 08/10/2016 CLINICAL DATA:  Chest pain EXAM: CT CHEST WITHOUT CONTRAST TECHNIQUE: Multidetector CT imaging of the chest was performed following the standard protocol without IV contrast. COMPARISON:  Chest radiograph August 08, 2016 FINDINGS: Cardiovascular: The measured transverse diameter of the ascending thoracic aorta is 4.2 x 4.1 cm, measured on axial slice 59 series 2. There are foci of atherosclerotic calcification in the aorta. The visualized  great vessels show scattered foci of calcification. Visualized great vessels otherwise appear unremarkable on this noncontrast enhanced study. There are multiple foci of coronary artery calcification. Heart is mildly enlarged. Pericardium is not appreciably thickened. Mediastinum/Nodes: Visualized thyroid appears unremarkable. There is no appreciable thoracic adenopathy. There is a small hiatal hernia. Lungs/Pleura: There is a degree of underlying centrilobular emphysematous change. There is a mild degree of peripheral fibrosis in the upper lobe regions. There is no edema or consolidation. There is no pleural thickening or pleural effusion evident. Upper Abdomen: In the visualized upper abdomen, there is atherosclerotic calcification in the aorta  and major mesenteric vessels. Visualized upper abdominal structures otherwise appear unremarkable. Musculoskeletal: There are no blastic or lytic bone lesions. There is fairly mild degenerative change in the thoracic spine. There is also degenerative change in each shoulder. There is slight endplate concavity along the superior aspect of the T5 vertebral body. IMPRESSION: Ascending thoracic aortic diameter is 4.2 x 4.1 cm. Recommend annual imaging followup by CTA or MRA. This recommendation follows 2010 ACCF/AHA/AATS/ACR/ASA/SCA/SCAI/SIR/STS/SVM Guidelines for the Diagnosis and Management of Patients with Thoracic Aortic Disease. Circulation. 2010; [id number]. There is atherosclerotic calcification in the aorta as well as multiple foci of coronary artery calcification. Heart is mildly enlarged. Pericardium is not thickened. There is a degree of underlying centrilobular emphysematous change with mild fibrotic change in the periphery of the upper lobes bilaterally. No edema or consolidation. No adenopathy. Electronically Signed   By: Lowella Grip III M.D.   On: 08/10/2016 08:10    Discharge Medications: Allergies as of 08/16/2016      Reactions   Lidocaine Anaphylaxis   novacaine and lidocaine   Statins Nausea And Vomiting   GI symptoms on almost all statins      Medication List    TAKE these medications   amiodarone 200 MG tablet Commonly known as:  PACERONE Take 1 tablet (200 mg total) by mouth daily.   aspirin 81 MG EC tablet Take 1 tablet (81 mg total) by mouth daily.   ezetimibe 10 MG tablet Commonly known as:  ZETIA Take 1 tablet (10 mg total) by mouth daily.   gabapentin 600 MG tablet Commonly known as:  NEURONTIN Take 0.5 tablets (300 mg total) by mouth 3 (three) times daily.   levothyroxine 25 MCG tablet Commonly known as:  SYNTHROID, LEVOTHROID Take 1 tablet (25 mcg total) by mouth daily before breakfast. Start taking on:  08/17/2016   metoprolol tartrate 25 MG  tablet Commonly known as:  LOPRESSOR Take 0.5 tablets (12.5 mg total) by mouth 2 (two) times daily.   traMADol 50 MG tablet Commonly known as:  ULTRAM Take 1 tablet (50 mg total) by mouth every 6 (six) hours as needed for moderate pain.      The patient has been discharged on:   1.Beta Blocker:  Yes [  x ]                              No   [   ]                              If No, reason:  2.Ace Inhibitor/ARB: Yes [   ]  No  [  n  ]                                     If No, reason:BP too low at times  3.Statin:   Yes [   ]                  No  [ x  ]                  If No, reason:Allergy  4.[place]:  Yes  [  x ]                  No   [   ]                  If No, reason:  Follow Up Appointments: Follow-up Information    Kathlyn Sacramento, MD Follow up.   Specialty:  Cardiology Contact information: [place] [address] [phone]        [name], MD Follow up.   Specialty:  Cardiothoracic Surgery Why:  PA/LAT CXR to be taken (at [facility] which is in the same building as Dr. Everrett Coombe office) one hour prior to office appointment;Office will mail or call with appointment date and time. Contact information: [address] [place] [address] [phone]        Marton Redwood, MD Follow up.   Specialty:  Internal Medicine Why:  Call for an appointment regarding further surveillance of HGA1C 6 and treatment of hyopthyroidism (TSH 6.215 on 12/11) Contact information: [place] [address] [phone]           Signed: Jadene Pierini EPA-C 08/16/2016, 8:01 AM

## 2016-08-15 NOTE — Progress Notes (Signed)
Patient ambulated 314ft on room air. Sats were 94%. He tolerated it well.

## 2016-08-16 ENCOUNTER — Telehealth: Payer: Self-pay | Admitting: *Deleted

## 2016-08-16 MED ORDER — GABAPENTIN 600 MG PO TABS: 300.0000 mg | ORAL_TABLET | Freq: Three times a day (TID) | ORAL | 0 refills | Status: DC

## 2016-08-16 MED ORDER — AMIODARONE HCL 200 MG PO TABS: 200.0000 mg | ORAL_TABLET | Freq: Every day | ORAL | 1 refills | Status: DC

## 2016-08-16 MED ORDER — ASPIRIN 81 MG PO TBEC: 81.0000 mg | DELAYED_RELEASE_TABLET | Freq: Every day | ORAL | Status: DC

## 2016-08-16 MED ORDER — METOPROLOL TARTRATE 25 MG PO TABS: 12.5000 mg | ORAL_TABLET | Freq: Two times a day (BID) | ORAL | 1 refills | Status: DC

## 2016-08-16 MED ORDER — EZETIMIBE 10 MG PO TABS: 10.0000 mg | ORAL_TABLET | Freq: Every day | ORAL | 1 refills | Status: AC

## 2016-08-16 MED ORDER — TRAMADOL HCL 50 MG PO TABS: 50.0000 mg | ORAL_TABLET | Freq: Four times a day (QID) | ORAL | 0 refills | Status: DC | PRN

## 2016-08-16 MED ORDER — LEVOTHYROXINE SODIUM 25 MCG PO TABS: 25.0000 ug | ORAL_TABLET | Freq: Every day | ORAL | 1 refills | Status: DC

## 2016-08-16 NOTE — Progress Notes (Signed)
CARDIAC REHAB PHASE I   PRE:  Rate/Rhythm: 72 SR    BP: sitting 109/72    SaO2: 93 RA  MODE:  Ambulation: 350 ft   POST:  Rate/Rhythm: 88 SR    BP: sitting 117/92     SaO2: 93 RA  Tolerated well, no RW needed. Pt did sway at times but was able to correct. He does not want a RW. Pt sts he will be better in his shoes.  Ed completed with pt and girlfriend. Voiced understanding. Will send referral to [facility].  [place], ACSM 08/16/2016 11:36 AM

## 2016-08-16 NOTE — Progress Notes (Addendum)
CT sutures removed and steri stirps applied. Patient given discharge instructions medication list and paper prescriptions along with follow up appointments. Patients questions answered. Patients prescriptions faxed to patients personal pharmacy as requested. IV and tele dcd. Will discharge home as ordered. Fabian Coca, Bettina Gavia RN

## 2016-08-16 NOTE — Telephone Encounter (Signed)
-----   Message from Blain Pais sent at 08/16/2016 10:08 AM EST ----- Regarding: tcm/ph 12/26 11:45 Dr. Fletcher Anon

## 2016-08-16 NOTE — Care Management Note (Signed)
Case Management Note  Patient Details  Name: Thomas Fuentes MRN: [id number] Date of Birth: 1941/01/21  Subjective/Objective:     Pt is s/p CABG               Action/Plan:   PTA pt independent from home alone.  Pt has already made arrangements to stay with a lady friend post discharge.  CM will continue to follow for discharge needs   Expected Discharge Date:                  Expected Discharge Plan:  Home/Self Care  In-House Referral:     Discharge planning Services  CM Consult  Post Acute Care Choice:    Choice offered to:     DME Arranged:    DME Agency:     HH Arranged:    HH Agency:     Status of Service:  In process, will continue to follow  If discussed at Long Length of Stay Meetings, dates discussed:    Additional Comments: Pt discharging home today with lady friend.  CM spoke with pt prior to discharge - No CM needs determined.   Maryclare Labrador, RN 08/16/2016, 9:07 AM

## 2016-08-16 NOTE — Op Note (Signed)
NAMETOBE, KAN NO.:  [id number]  MEDICAL RECORD NO.:  [id number]  LOCATION:                                 FACILITY:  PHYSICIAN:  Lanelle Bal, MD    DATE OF BIRTH:  June 24, 1941  DATE OF PROCEDURE:  08/11/2016 DATE OF DISCHARGE:                              OPERATIVE REPORT   PREOPERATIVE DIAGNOSES:  Coronary occlusive disease with unstable angina.  POSTOPERATIVE DIAGNOSIS:  Coronary occlusive disease with unstable angina.  PROCEDURE PERFORMED:  Coronary artery bypass grafting x4 with left internal mammary to the left anterior descending coronary artery, sequential reverse saphenous vein graft to the first obtuse marginal and distal circumflex, reverse saphenous vein graft to the posterior descending coronary artery with endoscopic right greater saphenous vein harvesting, thigh and calf.  SURGEON:  Lanelle Bal, MD.  ASSISTANT:  Nicholes Rough, PA.  BRIEF HISTORY:  The patient is a 75 year old male with known previous coronary artery disease having had stents placed 15 years previously. Several days before surgery, he presented with acute onset of unstable anginal pain.  He was stabilized medically and started on heparin.  He underwent cardiac catheterization, which demonstrated severe restenoses of this stented area in the LAD and severe 3-vessel coronary artery disease with overall preserved LV function.  A CT scan was done on the chest, which demonstrated mild enlargement of his ascending aorta with maximum dimension of approximately 4.2 cm with evidence of a trileaflet aortic valve.  The patient has known renal insufficiency with stage 3 chronic kidney disease.  He was transferred to [facility] for consideration for bypass surgery.  With his creatinine stabilized, we proceeded with coronary artery bypass grafting on the date noted.  Risks and options were discussed with the patient in detail, and he was agreeable with proceeding and  signed informed consent.  DESCRIPTION OF PROCEDURE:  With Swan-Ganz and arterial line monitors in place, the patient underwent general endotracheal anesthesia without incident.  A TEE probe was passed by Dr. Kalman Shan.  The skin of the chest and legs was prepped with Betadine and draped in usual sterile manner. After appropriate time-out was performed, we proceeded with endoscopic vein harvesting of the right greater saphenous vein from the thigh and calf.  The vein was of excellent quality.  Median sternotomy was performed.  The left internal mammary artery was dissected down as a pedicle graft.  The distal artery was divided and had good free flow. The vessel was hydrostatically dilated with heparinized saline. Pericardium was then opened.  As noted, the aorta was mildly dilated and did not appear to be particularly thinned out.  In addition, anteriorly just at the takeoff of the innominate artery to the mid ascending aorta, there was a significant easily palpable calcific plaque in the aorta. This modified our cannulation process.  The patient was systemically heparinized.  The aorta was cannulated at an area just after the takeoff of the innominate artery in the arch, where the aorta was soft with a metal-tipped arterial cannula.  A dual stage venous cannula was placed in the right atrium.  Before going on bypass, heparin had been administered.  ACT only reached the 300 mark.  Additional heparin was given.  At this point, the ACT was between 4 and 500.  We proceeded to go on bypass with additional heparin in the pump of 10,000 units.  Soon after bypass, we rechecked the ACT and it had dropped to 380.  With this in mind, we then gave antithrombin III in the pump and follow up ACT was maintained just over 500 as we proceeded with the case.  After going on pump, aortic root vent cardioplegia needle was introduced into the proximal aorta away from the calcific plaque.  The sites of  anastomosis were selected and dissected out of the epicardium.  The patient's body temperature cooled to 32 degrees.  Aortic crossclamp was applied in the mid to proximal aorta avoiding the area of calcific plaque.  Cold blood cardioplegia was administered antegrade 600 mL with rapid diastolic arrest of the heart.  Myocardial septal temperature was monitored throughout the crossclamp.  We then progressed with a 4-vessel bypass. The distal right coronary artery was significantly calcified.  The proximal part of the posterior descending was dissected out and was suitable for bypass.  The distal artery was very small.  The vessel was opened and a 1 mm probe passed distally.  Using a running 7-0 Prolene, distal anastomosis was performed.  The heart was then elevated and the first obtuse marginal in the midportion, where there was a significant lesion just distal to this the vessel was opened, admitted 1.5 mm probe distally.  Using diamond-type, side-to-side anastomosis with a running 8- 0 Prolene, segment of reverse saphenous vein graft was anastomosed to the first obtuse marginal.  The second obtuse marginal was a very small vessel.  The distal circumflex was slightly larger.  This vessel was opened and admitted a 1.5 mm probe.  Using a running 7-0 Prolene, distal anastomosis was performed.  Additional cold blood cardioplegia was intermittently perfused down the vein graft.  Attention was then turned to the left anterior descending coronary artery.  In the distal third of the vessel opened and admitted a 1.5 mm probe proximally and 1 mm probe distally.  Using a running 8-0 Prolene, left internal mammary artery was anastomosed to left anterior descending coronary artery.  With release of the bulldog on the mammary artery, there was appropriate rise in myocardial septal temperature.  With the crossclamp still in place, 2 puncture aortotomies were performed and each of the 2 vein grafts  were anastomosed to the ascending aorta.  The heart was allowed to passively fill and de-aired.  Bulldog on the mammary artery was removed with prompt rise in myocardial septal temperature.  The proximal anastomoses were completed.  The aortic cross-clamp was removed.  Total cross-clamp time of 96 minutes.  Sites of anastomosis were then inspected and were free of bleeding.  The patient was then ventilated and weaned from cardiopulmonary bypass without difficulty without pressors.  He remained hemodynamically stable.  He was decannulated in usual fashion. Protamine sulfate was administered.  With operative field hemostatic, a left pleural tube and a Blake mediastinal drain were left in place. Atrial and ventricular pacing wires were applied.  The patient did have a slow sinus rhythm, was atrially paced to increased rate.  He was then ventilated and weaned from cardiopulmonary bypass without difficulty. He remained hemodynamically stable.  He was decannulated in usual fashion.  Total pump time was 143 minutes.  The left pleural tube and Blake mediastinal tubes were left in place.  Pericardium was loosely reapproximated.  Sternum was  closed with #6 stainless steel wire, fascia was closed with interrupted 0 Vicryl, running 3-0 Vicryl subcutaneous tissue, 3-0 subcuticular stitch in skin edges.  Dry dressings were applied.  Sponge and needle count was reported as correct at completion of the procedure.  The patient tolerated the procedure without obvious complication and was transferred to the Surgical Intensive care Unit for further postoperative care.     Lanelle Bal, MD     EG/MEDQ  D:  08/12/2016  T:  08/13/2016  Job:  [id number]

## 2016-08-16 NOTE — Progress Notes (Addendum)
[address]       [place], [address]             [phone]      5 Days Post-Op Procedure(s) (LRB): CORONARY ARTERY BYPASS GRAFTING on pump using left internal mammary artery to left anterior descending coronary artery , portion of right greater saphenous vein to right coronary artery, portion of right greater saphenous vein graft to distal circumflex. (N/A) TRANSESOPHAGEAL ECHOCARDIOGRAM (TEE) (N/A) ENDOVEIN HARVEST OF GREATER SAPHENOUS VEIN (Right) Subjective: Feels well  Objective: Vital signs in last 24 hours: Temp:  [98.6 F (37 C)-99.3 F (37.4 C)] 98.6 F (37 C) (12/18 0605) Pulse Rate:  [68-74] 68 (12/18 0605) Cardiac Rhythm: Normal sinus rhythm (12/17 2000) Resp:  [18-20] 18 (12/18 0605) BP: (102-132)/(65-83) 107/66 (12/18 0605) SpO2:  [93 %-94 %] 94 % (12/18 0605) Weight:  [151 lb 9.6 oz (68.8 kg)] 151 lb 9.6 oz (68.8 kg) (12/18 [id number])  Hemodynamic parameters for last 24 hours:    Intake/Output from previous day: 12/17 0701 - 12/18 0700 In: -  Out: 1225 [Urine:1225] Intake/Output this shift: No intake/output data recorded.  General appearance: alert, cooperative and no distress Heart: regular rate and rhythm Lungs: clear to auscultation bilaterally Abdomen: benign Extremities: minor r LE edema Wound: incis healing well  Lab Results:  Recent Labs  08/14/16 0444 08/15/16 0300  WBC 8.8 8.5  HGB 8.1* 8.5*  HCT 24.7* 26.7*  PLT 140* 210   BMET:  Recent Labs  08/14/16 0444 08/15/16 0300  NA 136 137  K 4.1 4.1  CL 103 103  CO2 26 26  GLUCOSE 111* 114*  BUN 16 17  CREATININE 1.06 1.13  CALCIUM 8.5* 8.9    PT/INR: No results for input(s): LABPROT, INR in the last 72 hours. ABG    Component Value Date/Time   PHART 7.369 08/11/2016 2214   HCO3 23.2 08/11/2016 2214   TCO2 24 08/12/2016 1712   ACIDBASEDEF 2.0 08/11/2016 2214   O2SAT 97.0 08/11/2016 2214   CBG (last 3)   Recent Labs  08/14/16 1622 08/14/16 2121  08/15/16 0639  GLUCAP 121* 144* 114*    Meds Scheduled Meds: . amiodarone  200 mg Oral Daily  . aspirin EC  81 mg Oral Daily  . docusate sodium  200 mg Oral Daily  . enoxaparin (LOVENOX) injection  30 mg Subcutaneous QHS  . ezetimibe  10 mg Oral Daily  . furosemide  40 mg Oral Daily  . gabapentin  300 mg Oral TID  . levothyroxine  25 mcg Oral QAC breakfast  . mouth rinse  15 mL Mouth Rinse BID  . metoprolol tartrate  12.5 mg Oral BID  . pantoprazole  40 mg Oral QAC breakfast  . potassium chloride  20 mEq Oral Daily  . sodium chloride flush  3 mL Intravenous Q12H   Continuous Infusions: PRN Meds:.sodium chloride, acetaminophen, bisacodyl **OR** bisacodyl, guaiFENesin, ondansetron **OR** ondansetron (ZOFRAN) IV, oxyCODONE, sodium chloride flush, traMADol  Xrays Dg Chest 2 View  Result Date: 08/15/2016 CLINICAL DATA:  Postop EXAM: CHEST  2 VIEW COMPARISON:  08/14/2016 FINDINGS: Prior CABG. Cardiomegaly. Small bilateral pleural effusions. No edema or confluent airspace opacities. IMPRESSION: Cardiomegaly.  Small bilateral pleural effusions. Electronically Signed   By: Rolm Baptise M.D.   On: 08/15/2016 08:09    Assessment/Plan: S/P Procedure(s) (LRB): CORONARY ARTERY BYPASS GRAFTING on pump using left internal mammary artery to left anterior descending coronary artery , portion of right greater  saphenous vein to right coronary artery, portion of right greater saphenous vein graft to distal circumflex. (N/A) TRANSESOPHAGEAL ECHOCARDIOGRAM (TEE) (N/A) ENDOVEIN HARVEST OF GREATER SAPHENOUS VEIN (Right)   1 doing well, seen by Dr Servando Snare this am and he is stable for discharge 2 reviewed instructions   LOS: 7 days    Thomas Fuentes,Thomas Fuentes 08/16/2016  Patient feels well this am, sinus rhythm Plan going home today I have seen and examined Marisa Cyphers and agree with the above assessment  and plan.  [name] MD Bee[place] [phone] Office [phone] 08/16/2016 9:00 AM

## 2016-08-16 NOTE — Telephone Encounter (Signed)
Patient contacted regarding discharge from [facility] on 08/16/16.  Patient understands to follow up with provider Dr. Fletcher Anon on 08/24/16 at 11:45AM at [facility]. Patient understands discharge instructions? Not yet received Patient understands medications and regiment? States that they will go over with him Patient understands to bring all medications to this visit? Yes  Patient states that he is getting ready to be discharged home today. He states that they have not yet been in to review discharge instructions. Instructed him to please give [place] a call if he has any questions about his medications or discharge instructions. He verbalized understanding and had no further questions at this time.

## 2016-08-24 ENCOUNTER — Ambulatory Visit (INDEPENDENT_AMBULATORY_CARE_PROVIDER_SITE_OTHER): Payer: Medicare Other | Admitting: Cardiovascular Disease

## 2016-08-24 ENCOUNTER — Encounter: Payer: Self-pay | Admitting: Cardiovascular Disease

## 2016-08-24 VITALS — BP 130/64 | HR 51 | Ht 68.0 in | Wt 152.2 lb

## 2016-08-24 DIAGNOSIS — I10 Essential (primary) hypertension: Secondary | ICD-10-CM | POA: Diagnosis not present

## 2016-08-24 DIAGNOSIS — I714 Abdominal aortic aneurysm, without rupture, unspecified: Secondary | ICD-10-CM

## 2016-08-24 DIAGNOSIS — E785 Hyperlipidemia, unspecified: Secondary | ICD-10-CM

## 2016-08-24 DIAGNOSIS — I2510 Atherosclerotic heart disease of native coronary artery without angina pectoris: Secondary | ICD-10-CM | POA: Diagnosis not present

## 2016-08-24 NOTE — Progress Notes (Signed)
Cardiology Office Note   Date:  08/24/2016   ID:  Thomas Fuentes, DOB Jan 13, 1941, MRN [id number]  PCP:  Marton Redwood, MD  Cardiologist:   Kathlyn Sacramento, MD   Chief Complaint  Patient presents with  . other    F/u CABG. Meds reviewed verbally with pt.      History of Present Illness: Thomas Fuentes is a 75 y.o. male who presents for a follow-up visit regarding coronary artery disease. The patient has known history of coronary artery disease status post myocardial infarction in 2000. He had PCI and 2 stent placement at that time.  He has chronic medical conditions that include hypertension, hyperlipidemia (intolerant to statins), type 2 diabetes and chronic kidney disease.  He presented in early this month with a small non-ST elevation myocardial infarction. I proceeded with cardiac catheterization which showed severe three-vessel coronary artery disease with in-stent restenosis in both LAD and RCA. LVEDP was moderately elevated. EF was 50% by echo. He was transferred to [place] and underwent CABG by Dr. Servando Snare with LIMA to LAD, SVG to RCA and SVG to distal left circumflex. Postoperative course was unremarkable except for atrial fibrillation which was controlled with amiodarone. He has been doing well since hospital discharge with no recurrent chest discomfort except at the incision. No significant leg edema. No palpitations.     Past Medical History:  Diagnosis Date  . AAA (abdominal aortic aneurysm) ([place])   . Chronic renal disease, stage III   . Coronary artery disease    a. MI 2000 s/p PCI and 2 stent placement to unknown arteries; b. Myoview negative; c. LHC 08/09/2016 o-pLAD 30%, mLAD 99%, OM2 50%, OM3 85%, pRCA 99%, mid RCA 60%, RPDA 60%, LVEDP mod elevated  . Diabetes mellitus without complication ([place])   . Dyslipidemia   . Hyperlipidemia   . Hypertension   . Hypothyroidism   . Macular degeneration    bilateral  . Metabolic syndrome   . MI (myocardial infarction)     . Peripheral neuropathy ([place])   . Peripheral vascular disease [facility])     Past Surgical History:  Procedure Laterality Date  . CARDIAC CATHETERIZATION  2000   [place] x2 stents  . CARDIAC CATHETERIZATION N/A 08/09/2016   Procedure: Left Heart Cath and Coronary Angiography;  Surgeon: Wellington Hampshire, MD;  Location: [place] [facility];  Service: Cardiovascular;  Laterality: N/A;  . CORONARY ARTERY BYPASS GRAFT N/A 08/11/2016   Procedure: CORONARY ARTERY BYPASS GRAFTING on pump using left internal mammary artery to left anterior descending coronary artery , portion of right greater saphenous vein to right coronary artery, portion of right greater saphenous vein graft to distal circumflex.;  Surgeon: [name], MD;  Location: [place];  Service: Open Heart Surgery;  Laterality: N/A;  . CORONARY STENT PLACEMENT    . ENDOVEIN HARVEST OF GREATER SAPHENOUS VEIN Right 08/11/2016   Procedure: ENDOVEIN HARVEST OF GREATER SAPHENOUS VEIN;  Surgeon: [name], MD;  Location: [place];  Service: Open Heart Surgery;  Laterality: Right;  . TEE WITHOUT CARDIOVERSION N/A 08/11/2016   Procedure: TRANSESOPHAGEAL ECHOCARDIOGRAM (TEE);  Surgeon: [name], MD;  Location: [place];  Service: Open Heart Surgery;  Laterality: N/A;     Current Outpatient Prescriptions  Medication Sig Dispense Refill  . amiodarone (PACERONE) 200 MG tablet Take 1 tablet (200 mg total) by mouth daily. 30 tablet 1  . aspirin EC 81 MG EC tablet Take 1 tablet (81 mg total) by  mouth daily.    Marland Kitchen ezetimibe (ZETIA) 10 MG tablet Take 1 tablet (10 mg total) by mouth daily. 30 tablet 1  . gabapentin (NEURONTIN) 600 MG tablet Take 0.5 tablets (300 mg total) by mouth 3 (three) times daily. 45 tablet 0  . levothyroxine (SYNTHROID, LEVOTHROID) 25 MCG tablet Take 1 tablet (25 mcg total) by mouth daily before breakfast. 30 tablet 1  . metoprolol tartrate (LOPRESSOR) 25 MG tablet Take 0.5 tablets (12.5 mg total) by mouth 2 (two) times  daily. 15 tablet 1  . traMADol (ULTRAM) 50 MG tablet Take 1 tablet (50 mg total) by mouth every 6 (six) hours as needed for moderate pain. 30 tablet 0   No current facility-administered medications for this visit.     Allergies:   Lidocaine and Statins    Social History:  The patient  reports that he quit smoking about 17 years ago. His smoking use included Cigarettes. He quit after 40.00 years of use. He has never used smokeless tobacco. He reports that he drinks alcohol. He reports that he does not use drugs.   Family History:  The patient's family history includes Hypertension in his mother.    ROS:  Please see the history of present illness.   Otherwise, review of systems are positive for none.   All other systems are reviewed and negative.    PHYSICAL EXAM: VS:  BP 130/64 (BP Location: Left Arm, Patient Position: Sitting, Cuff Size: Normal)   Pulse (!) 51   Ht 5\' 8"  (1.727 m)   Wt 152 lb 4 oz (69.1 kg)   BMI 23.15 kg/m  , BMI Body mass index is 23.15 kg/m. GEN: Well nourished, well developed, in no acute distress  HEENT: normal  Neck: no JVD, carotid bruits, or masses Cardiac: RRR; no murmurs, rubs, or gallops,no edema  Respiratory:  clear to auscultation bilaterally, normal work of breathing GI: soft, nontender, nondistended, + BS MS: no deformity or atrophy  Skin: warm and dry, no rash Neuro:  Strength and sensation are intact Psych: euthymic mood, full affect   EKG:  EKG is not ordered today.    Recent Labs: 08/09/2016: TSH 6.215 08/10/2016: ALT 11 08/14/2016: Magnesium 1.8 08/15/2016: BUN 17; Creatinine, Ser 1.13; Hemoglobin 8.5; Platelets 210; Potassium 4.1; Sodium 137    Lipid Panel    Component Value Date/Time   CHOL 192 08/09/2016 0719   TRIG 268 (H) 08/09/2016 0719   HDL 27 (L) 08/09/2016 0719   CHOLHDL 7.1 08/09/2016 0719   VLDL 54 (H) 08/09/2016 0719   LDLCALC 111 (H) 08/09/2016 0719      Wt Readings from Last 3 Encounters:  08/24/16 152 lb  4 oz (69.1 kg)  08/16/16 151 lb 9.6 oz (68.8 kg)  08/09/16 153 lb 8 oz (69.6 kg)        ASSESSMENT AND PLAN:  1.  Coronary artery disease involving native coronary arteries without angina:  He is doing very well after recent CABG with no anginal symptoms. Continue daily aspirin. He is to start cardiac rehabilitation after being cleared by Dr. Servando Snare.   2. Postoperative atrial fibrillation: He seems to be in sinus rhythm. Amiodarone can likely be discontinued within the next few months.   3. Abdominal aortic aneurysm: This was small on ultrasound in 2015. I will plan on a follow-up ultrasound in the near future.   4. Hyperlipidemia: The patient is known to be intolerant to statins due to GI symptoms and currently he is on Zetia. I  will plan on doing fasting lipid and liver profile with his next visit and consider a small dose rosuvastatin.  5. Essential hypertension: Blood pressure is reasonable on metoprolol. He used to be on ramipril and would continue to monitor blood pressure to see if we need to resume this.   Disposition:   FU with me in 3 months  Signed, Kathlyn Sacramento, MD  08/24/2016 12:22 PM    [facility]

## 2016-08-24 NOTE — Patient Instructions (Signed)
Medication Instructions: Continue same mediations.   Labwork: None.   Procedures/Testing: None.   Follow-Up: 3 months with Dr. Fletcher Anon (come fasting for labs)  Any Additional Special Instructions Will Be Listed Below (If Applicable).     If you need a refill on your cardiac medications before your next appointment, please call your pharmacy.

## 2016-09-01 DIAGNOSIS — H353212 Exudative age-related macular degeneration, right eye, with inactive choroidal neovascularization: Secondary | ICD-10-CM | POA: Diagnosis not present

## 2016-09-01 DIAGNOSIS — H353222 Exudative age-related macular degeneration, left eye, with inactive choroidal neovascularization: Secondary | ICD-10-CM | POA: Diagnosis not present

## 2016-09-08 DIAGNOSIS — R7301 Impaired fasting glucose: Secondary | ICD-10-CM | POA: Diagnosis not present

## 2016-09-08 DIAGNOSIS — E038 Other specified hypothyroidism: Secondary | ICD-10-CM | POA: Diagnosis not present

## 2016-09-08 DIAGNOSIS — Z125 Encounter for screening for malignant neoplasm of prostate: Secondary | ICD-10-CM | POA: Diagnosis not present

## 2016-09-08 DIAGNOSIS — E784 Other hyperlipidemia: Secondary | ICD-10-CM | POA: Diagnosis not present

## 2016-09-08 DIAGNOSIS — I10 Essential (primary) hypertension: Secondary | ICD-10-CM | POA: Diagnosis not present

## 2016-09-10 ENCOUNTER — Other Ambulatory Visit: Payer: Self-pay | Admitting: Cardiothoracic Surgery

## 2016-09-10 DIAGNOSIS — Z951 Presence of aortocoronary bypass graft: Secondary | ICD-10-CM

## 2016-09-13 ENCOUNTER — Ambulatory Visit
Admission: RE | Admit: 2016-09-13 | Discharge: 2016-09-13 | Disposition: A | Discharge status: Home / Self Care | Payer: Medicare Other | Source: Ambulatory Visit | Attending: Cardiothoracic Surgery | Admitting: Cardiothoracic Surgery

## 2016-09-13 ENCOUNTER — Ambulatory Visit (INDEPENDENT_AMBULATORY_CARE_PROVIDER_SITE_OTHER): Payer: Self-pay | Admitting: Physician Assistant

## 2016-09-13 VITALS — BP 130/80 | HR 51 | Resp 20 | Ht 68.0 in | Wt 152.0 lb

## 2016-09-13 DIAGNOSIS — Z951 Presence of aortocoronary bypass graft: Secondary | ICD-10-CM

## 2016-09-13 DIAGNOSIS — I2510 Atherosclerotic heart disease of native coronary artery without angina pectoris: Secondary | ICD-10-CM | POA: Diagnosis not present

## 2016-09-13 MED ORDER — LISINOPRIL 5 MG PO TABS: 5.0000 mg | ORAL_TABLET | Freq: Every day | ORAL | 1 refills | Status: DC

## 2016-09-13 MED ORDER — METOPROLOL TARTRATE 25 MG PO TABS: 12.5000 mg | ORAL_TABLET | Freq: Two times a day (BID) | ORAL | 1 refills | Status: DC

## 2016-09-13 NOTE — Progress Notes (Signed)
  HPI:  Patient returns for routine postoperative follow-up having undergone a CAB x 4 on 08/11/2016 by Dr. Servando Snare. The patient's early postoperative recovery while in the hospital was notable for atrial fibrillation Since hospital discharge the patient reports he is feeling fairly well and has no specific complaints.   Current Outpatient Prescriptions  Medication Sig Dispense Refill  . amiodarone (PACERONE) 200 MG tablet Take 1 tablet (200 mg total) by mouth daily. 30 tablet 1  . aspirin EC 81 MG EC tablet Take 1 tablet (81 mg total) by mouth daily.    Marland Kitchen ezetimibe (ZETIA) 10 MG tablet Take 1 tablet (10 mg total) by mouth daily. 30 tablet 1  . gabapentin (NEURONTIN) 600 MG tablet Take 0.5 tablets (300 mg total) by mouth 3 (three) times daily. 45 tablet 0  . levothyroxine (SYNTHROID, LEVOTHROID) 25 MCG tablet Take 1 tablet (25 mcg total) by mouth daily before breakfast. 30 tablet 1  . metoprolol tartrate (LOPRESSOR) 25 MG tablet Take 0.5 tablets (12.5 mg total) by mouth 2 (two) times daily. 15 tablet 1  . traMADol (ULTRAM) 50 MG tablet Take 1 tablet (50 mg total) by mouth every 6 (six) hours as needed for moderate pain. 30 tablet 0  Vital Signs: BP 130/80, HR 51, RR 20, and oxygen saturation 98% on room air   Physical Exam: CV-Slightly bradycardic Pulmonary-Clear to auscultation bilaterally Extremities-No LE edema Wounds-Clean and dry  Diagnostic Tests: CLINICAL DATA:  Hypertension.  Coronary artery disease  EXAM: CHEST  2 VIEW  COMPARISON:  August 15, 2016  FINDINGS: There is no edema or consolidation. Heart size and pulmonary vascularity are normal. No adenopathy. Patient is status post coronary artery bypass grafting. There is atherosclerotic calcification in the at ascending aortic region. There is degenerative change in the thoracic spine.  IMPRESSION: No edema or consolidation.  Aortic atherosclerosis.   Electronically Signed   By: Lowella Grip III  M.D.   On: 09/13/2016 14:07  Impression and Plan: Overall, Thomas Fuentes is recovering well from coronary artery bypass grafting surgery. Patient states his heart rate is typically in the 50's. He is still on Amiodarone 200 mg daily (as had post op a fib)and per Dr. Fletcher Anon, will likely stop in March when he sees him next. Patien requested a refill for Lopressor 12.5 mg bid. I gave him this along with a new prescription for Lisinopril 5 mg daily to better control his blood pressure and he is s/p NSTEMI. Patient has a history of AAA (small on last [place]) and will be getting an [place] in the near future to monitor it. Patient is scheduled to start cardiac rehab on Monday. He is not taking Ultram for pain anymore. He was instructed he may start driving short distances (i.e. 30 minutes or less during the day) and gradually increase his frequency and duration as tolerates. He was instructed to continue with sternal precautions (i.e. No lifting 10 pounds for at least 3-4 more weeks). He has a wedding to attend in [place] at the end of the month. I told him he can be driven down but should make frequent stops and walk around. 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 He will return to see Dr. Servando Snare in 3 weeks, and likely will be released to return to work at that time.    Nani Skillern, PA-C [facility] [phone]

## 2016-09-13 NOTE — Patient Instructions (Addendum)
You may return to driving an automobile as long as you are no longer requiring oral narcotic pain relievers during the daytime.  It would be wise to start driving only short distances during the daylight and gradually increase from there as you feel comfortable.  You may continue to gradually increase your physical activity as tolerated.  Refrain from any heavy lifting or strenuous use of your arms and shoulders until at least 8 weeks from the time of your surgery, and avoid activities that cause increased pain in your chest on the side of your surgical incision.  Otherwise, you may continue to increase activities without any particular limitations.  Increase the intensity and duration of physical activity gradually.  Make every effort to maintain a "heart-healthy" lifestyle with regular physical exercise and adherence to a low-fat, low-carbohydrate diet.  Continue to seek regular follow-up appointments with your primary care physician and/or cardiologist.  Do not return to work until after seen and cleared by Dr. Servando Snare

## 2016-09-16 ENCOUNTER — Encounter: Payer: PRIVATE HEALTH INSURANCE | Admitting: Cardiothoracic Surgery

## 2016-09-20 ENCOUNTER — Telehealth: Payer: Self-pay | Admitting: *Deleted

## 2016-09-20 ENCOUNTER — Encounter: Payer: Medicare Other | Attending: Cardiovascular Disease | Admitting: *Deleted

## 2016-09-20 VITALS — Ht 67.1 in | Wt 142.4 lb

## 2016-09-20 DIAGNOSIS — Z48812 Encounter for surgical aftercare following surgery on the circulatory system: Secondary | ICD-10-CM | POA: Insufficient documentation

## 2016-09-20 DIAGNOSIS — Z9889 Other specified postprocedural states: Secondary | ICD-10-CM | POA: Insufficient documentation

## 2016-09-20 DIAGNOSIS — Z951 Presence of aortocoronary bypass graft: Secondary | ICD-10-CM

## 2016-09-20 NOTE — Patient Instructions (Signed)
Patient Instructions  Patient Details  Name: Thomas Fuentes MRN: [id number] Date of Birth: 11-28-1940 Referring Provider:  Wellington Hampshire, MD  Below are the personal goals you chose as well as exercise and nutrition goals. Our goal is to help you keep on track towards obtaining and maintaining your goals. We will be discussing your progress on these goals with you throughout the program.  Initial Exercise Prescription:     Initial Exercise Prescription - 09/20/16 1400      Date of Initial Exercise RX and Referring Provider   Date 09/20/16   Referring Provider Kathlyn Sacramento MD     Treadmill   MPH 2.5   Grade 0.5   Minutes 15   METs 3.09     NuStep   Level 2   Minutes 15   METs 2     REL-XR   Level 1   Minutes 15   METs 2     Prescription Details   Frequency (times per week) 3   Duration Progress to 45 minutes of aerobic exercise without signs/symptoms of physical distress     Intensity   THRR 40-80% of Max Heartrate 85-124   Ratings of Perceived Exertion 11-15   Perceived Dyspnea 0-4     Progression   Progression Continue to progress workloads to maintain intensity without signs/symptoms of physical distress.     Resistance Training   Training Prescription Yes   Weight 3 lbs   Reps 10-15      Exercise Goals: Frequency: Be able to perform aerobic exercise three times per week working toward 3-5 days per week.  Intensity: Work with a perceived exertion of 11 (fairly light) - 15 (hard) as tolerated. Follow your new exercise prescription and watch for changes in prescription as you progress with the program. Changes will be reviewed with you when they are made.  Duration: You should be able to do 30 minutes of continuous aerobic exercise in addition to a 5 minute warm-up and a 5 minute cool-down routine.  Nutrition Goals: Your personal nutrition goals will be established when you do your nutrition analysis with the dietician.  The following are nutrition  guidelines to follow: Cholesterol < 200mg /day Sodium < 1500mg /day Fiber: Men over 50 yrs - 30 grams per day  Personal Goals:     Personal Goals and Risk Factors at Admission - 09/20/16 1212      Core Components/Risk Factors/Patient Goals on Admission    Weight Management Yes;Weight Maintenance   Intervention Weight Management: Develop a combined nutrition and exercise program designed to reach desired caloric intake, while maintaining appropriate intake of nutrient and fiber, sodium and fats, and appropriate energy expenditure required for the weight goal.;Weight Management: Provide education and appropriate resources to help participant work on and attain dietary goals.   Admit Weight 142 lb 6.4 oz (64.6 kg)   Expected Outcomes Weight Maintenance: Understanding of the daily nutrition guidelines, which includes 25-35% calories from fat, 7% or less cal from saturated fats, less than 200mg  cholesterol, less than 1.5gm of sodium, & 5 or more servings of fruits and vegetables daily   Sedentary Yes   Intervention Provide advice, education, support and counseling about physical activity/exercise needs.;Develop an individualized exercise prescription for aerobic and resistive training based on initial evaluation findings, risk stratification, comorbidities and participant's personal goals.   Expected Outcomes Achievement of increased cardiorespiratory fitness and enhanced flexibility, muscular endurance and strength shown through measurements of functional capacity and personal statement of participant.  Increase Strength and Stamina Yes   Intervention Provide advice, education, support and counseling about physical activity/exercise needs.;Develop an individualized exercise prescription for aerobic and resistive training based on initial evaluation findings, risk stratification, comorbidities and participant's personal goals.   Expected Outcomes Achievement of increased cardiorespiratory fitness and  enhanced flexibility, muscular endurance and strength shown through measurements of functional capacity and personal statement of participant.   Hypertension Yes   Intervention Provide education on lifestyle modifcations including regular physical activity/exercise, weight management, moderate sodium restriction and increased consumption of fresh fruit, vegetables, and low fat dairy, alcohol moderation, and smoking cessation.;Monitor prescription use compliance.   Expected Outcomes Short Term: Continued assessment and intervention until BP is < 140/52mm HG in hypertensive participants. < 130/56mm HG in hypertensive participants with diabetes, heart failure or chronic kidney disease.;Long Term: Maintenance of blood pressure at goal levels.   Lipids Yes  Cannot tolerate Statins. HAs drasctically changed eating habits to help control Cholesterol Risk Factor   Intervention Provide education and support for participant on nutrition & aerobic/resistive exercise along with prescribed medications to achieve LDL 70mg , HDL >40mg .   Expected Outcomes Short Term: Participant states understanding of desired cholesterol values and is compliant with medications prescribed. Participant is following exercise prescription and nutrition guidelines.;Long Term: Cholesterol controlled with medications as prescribed, with individualized exercise RX and with personalized nutrition plan. Value goals: LDL < 70mg , HDL > 40 mg.      Tobacco Use Initial Evaluation: History  Smoking Status  . Former Smoker  . Years: 40.00  . Types: Cigarettes  . Quit date: 02/06/1999  Smokeless Tobacco  . Never Used    Copy of goals given to participant.

## 2016-09-20 NOTE — Progress Notes (Signed)
Cardiac Individual Treatment Plan  Patient Details  Name: Thomas Fuentes MRN: [id number] Date of Birth: 23-May-1941 Referring Provider:   Flowsheet Row Cardiac Rehab from 09/20/2016 in [facility] Cardiac and Pulmonary Rehab  Referring Provider  Kathlyn Sacramento MD      Initial Encounter Date:  Flowsheet Row Cardiac Rehab from 09/20/2016 in [facility] Cardiac and Pulmonary Rehab  Date  09/20/16  Referring Provider  Kathlyn Sacramento MD      Visit Diagnosis: S/P CABG x 4  Patient's Home Medications on Admission:  Current Outpatient Prescriptions:  .  amiodarone (PACERONE) 200 MG tablet, Take 1 tablet (200 mg total) by mouth daily., Disp: 30 tablet, Rfl: 1 .  aspirin EC 81 MG EC tablet, Take 1 tablet (81 mg total) by mouth daily., Disp: , Rfl:  .  ezetimibe (ZETIA) 10 MG tablet, Take 1 tablet (10 mg total) by mouth daily., Disp: 30 tablet, Rfl: 1 .  gabapentin (NEURONTIN) 600 MG tablet, Take 0.5 tablets (300 mg total) by mouth 3 (three) times daily., Disp: 45 tablet, Rfl: 0 .  levothyroxine (SYNTHROID, LEVOTHROID) 25 MCG tablet, Take 1 tablet (25 mcg total) by mouth daily before breakfast., Disp: 30 tablet, Rfl: 1 .  lisinopril (PRINIVIL,ZESTRIL) 5 MG tablet, Take 1 tablet (5 mg total) by mouth daily., Disp: 30 tablet, Rfl: 1 .  metoprolol tartrate (LOPRESSOR) 25 MG tablet, Take 0.5 tablets (12.5 mg total) by mouth 2 (two) times daily., Disp: 30 tablet, Rfl: 1 .  traMADol (ULTRAM) 50 MG tablet, Take 1 tablet (50 mg total) by mouth every 6 (six) hours as needed for moderate pain., Disp: 30 tablet, Rfl: 0  Past Medical History: Past Medical History:  Diagnosis Date  . AAA (abdominal aortic aneurysm) ([place])   . Chronic renal disease, stage III   . Coronary artery disease    a. MI 2000 s/p PCI and 2 stent placement to unknown arteries; b. Myoview negative; c. LHC 08/09/2016 o-pLAD 30%, mLAD 99%, OM2 50%, OM3 85%, pRCA 99%, mid RCA 60%, RPDA 60%, LVEDP mod elevated  . Diabetes mellitus without  complication ([place])   . Dyslipidemia   . Hyperlipidemia   . Hypertension   . Hypothyroidism   . Macular degeneration    bilateral  . Metabolic syndrome   . MI (myocardial infarction)   . Peripheral neuropathy ([place])   . Peripheral vascular disease ([place])     Tobacco Use: History  Smoking Status  . Former Smoker  . Years: 40.00  . Types: Cigarettes  . Quit date: 02/06/1999  Smokeless Tobacco  . Never Used    Labs: Recent Review Flowsheet Data    Labs for ITP Cardiac and Pulmonary Rehab Latest Ref Rng & Units 08/11/2016 08/11/2016 08/11/2016 08/11/2016 08/12/2016   Cholestrol 0 - 200 mg/dL - - - - -   LDLCALC 0 - 99 mg/dL - - - - -   HDL >40 mg/dL - - - - -   Trlycerides <150 mg/dL - - - - -   Hemoglobin A1c 4.8 - 5.6 % - - - - -   PHART 7.350 - 7.450 7.381 7.347(L) - 7.369 -   PCO2ART 32.0 - 48.0 mmHg 40.4 39.5 - 40.4 -   HCO3 20.0 - 28.0 mmol/L 24.2 21.5 - 23.2 -   TCO2 0 - 100 mmol/L 25 23 23 24 24    ACIDBASEDEF 0.0 - 2.0 mmol/L 1.0 4.0(H) - 2.0 -   O2SAT % 99.0 98.0 - 97.0 -  Exercise Target Goals: Date: 09/20/16  Exercise Program Goal: Individual exercise prescription set with THRR, safety & activity barriers. Participant demonstrates ability to understand and report RPE using BORG scale, to self-measure pulse accurately, and to acknowledge the importance of the exercise prescription.  Exercise Prescription Goal: Starting with aerobic activity 30 plus minutes a day, 3 days per week for initial exercise prescription. Provide home exercise prescription and guidelines that participant acknowledges understanding prior to discharge.  Activity Barriers & Risk Stratification:     Activity Barriers & Cardiac Risk Stratification - 09/20/16 1218      Activity Barriers & Cardiac Risk Stratification   Activity Barriers Other (comment);Incisional Pain   Comments Abdominal Hernia.   Wil not do sit ups.  Occasional pain from vein graft   Cardiac Risk Stratification High       6 Minute Walk:     6 Minute Walk    Row Name 09/20/16 1407         6 Minute Walk   Phase Initial     Distance 1350 feet     Walk Time 6 minutes     # of Rest Breaks 0     MPH 2.56     METS 2.89     RPE 9     VO2 Peak 10.11     Symptoms No     Resting HR 46 bpm     Resting BP 126/64     Max Ex. HR 97 bpm     Max Ex. BP 132/70     2 Minute Post BP 130/60        Initial Exercise Prescription:     Initial Exercise Prescription - 09/20/16 1400      Date of Initial Exercise RX and Referring Provider   Date 09/20/16   Referring Provider Kathlyn Sacramento MD     Treadmill   MPH 2.5   Grade 0.5   Minutes 15   METs 3.09     NuStep   Level 2   Minutes 15   METs 2     REL-XR   Level 1   Minutes 15   METs 2     Prescription Details   Frequency (times per week) 3   Duration Progress to 45 minutes of aerobic exercise without signs/symptoms of physical distress     Intensity   THRR 40-80% of Max Heartrate 85-124   Ratings of Perceived Exertion 11-15   Perceived Dyspnea 0-4     Progression   Progression Continue to progress workloads to maintain intensity without signs/symptoms of physical distress.     Resistance Training   Training Prescription Yes   Weight 3 lbs   Reps 10-15      Perform Capillary Blood Glucose checks as needed.  Exercise Prescription Changes:     Exercise Prescription Changes    Row Name 09/20/16 1200             Exercise Review   Progression -  walk test results         Response to Exercise   Blood Pressure (Admit) 126/64       Blood Pressure (Exercise) 132/70       Blood Pressure (Exit) 130/60       Heart Rate (Admit) 46 bpm       Heart Rate (Exercise) 99 bpm       Heart Rate (Exit) 55 bpm       Oxygen Saturation (Admit) 99 %  Oxygen Saturation (Exercise) 98 %       Perceived Dyspnea (Exercise) 9       Symptoms none          Exercise Comments:   Discharge Exercise Prescription (Final Exercise  Prescription Changes):     Exercise Prescription Changes - 09/20/16 1200      Exercise Review   Progression --  walk test results     Response to Exercise   Blood Pressure (Admit) 126/64   Blood Pressure (Exercise) 132/70   Blood Pressure (Exit) 130/60   Heart Rate (Admit) 46 bpm   Heart Rate (Exercise) 99 bpm   Heart Rate (Exit) 55 bpm   Oxygen Saturation (Admit) 99 %   Oxygen Saturation (Exercise) 98 %   Perceived Dyspnea (Exercise) 9   Symptoms none      Nutrition:  Target Goals: Understanding of nutrition guidelines, daily intake of sodium 1500mg , cholesterol 200mg , calories 30% from fat and 7% or less from saturated fats, daily to have 5 or more servings of fruits and vegetables.  Biometrics:     Pre Biometrics - 09/20/16 1410      Pre Biometrics   Height 5' 7.1" (1.704 m)   Weight 142 lb 6.4 oz (64.6 kg)   Waist Circumference 35 inches   Hip Circumference 34.5 inches   Waist to Hip Ratio 1.01 %   BMI (Calculated) 22.3   Single Leg Stand 13.97 seconds       Nutrition Therapy Plan and Nutrition Goals:     Nutrition Therapy & Goals - 09/20/16 1209      Intervention Plan   Intervention Prescribe, educate and counsel regarding individualized specific dietary modifications aiming towards targeted core components such as weight, hypertension, lipid management, diabetes, heart failure and other comorbidities.   Expected Outcomes Short Term Goal: Understand basic principles of dietary content, such as calories, fat, sodium, cholesterol and nutrients.;Short Term Goal: A plan has been developed with personal nutrition goals set during dietitian appointment.;Long Term Goal: Adherence to prescribed nutrition plan.      Nutrition Discharge: Rate Your Plate Scores:     Nutrition Assessments - 09/20/16 1209      MEDFICTS Scores   Pre Score 16      Nutrition Goals Re-Evaluation:   Psychosocial: Target Goals: Acknowledge presence or absence of depression,  maximize coping skills, provide positive support system. Participant is able to verbalize types and ability to use techniques and skills needed for reducing stress and depression.  Initial Review & Psychosocial Screening:     Initial Psych Review & Screening - 09/20/16 1216      Initial Review   Current issues with --  States NONE     Family Dynamics   Good Support System? Yes  Wife and children that live close by     Barriers   Psychosocial barriers to participate in program There are no identifiable barriers or psychosocial needs.     Screening Interventions   Interventions Encouraged to exercise      Quality of Life Scores:     Quality of Life - 09/20/16 1216      Quality of Life Scores   Health/Function Pre 21 %   Socioeconomic Pre 21 %   Psych/Spiritual Pre 21 %   Family Pre 21 %   GLOBAL Pre 21 %      PHQ-9: Recent Review Flowsheet Data    Depression screen [facility] 2/9 09/20/2016   Decreased Interest 0   Down, Depressed, Hopeless  0   PHQ - 2 Score 0   Altered sleeping 0   Tired, decreased energy 0   Change in appetite 0   Feeling bad or failure about yourself  0   Trouble concentrating 0   Moving slowly or fidgety/restless 0   Suicidal thoughts 0   PHQ-9 Score 0   Difficult doing work/chores Not difficult at all      Psychosocial Evaluation and Intervention:   Psychosocial Re-Evaluation:   Vocational Rehabilitation: Provide vocational rehab assistance to qualifying candidates.   Vocational Rehab Evaluation & Intervention:     Vocational Rehab - 09/20/16 1219      Initial Vocational Rehab Evaluation & Intervention   Assessment shows need for Vocational Rehabilitation No      Education: Education Goals: Education classes will be provided on a weekly basis, covering required topics. Participant will state understanding/return demonstration of topics presented.  Learning Barriers/Preferences:     Learning Barriers/Preferences - 09/20/16  1218      Learning Barriers/Preferences   Learning Barriers None   Learning Preferences None      Education Topics: General Nutrition Guidelines/Fats and Fiber: -Group instruction provided by verbal, written material, models and posters to present the general guidelines for heart healthy nutrition. Gives an explanation and review of dietary fats and fiber.   Controlling Sodium/Reading Food Labels: -Group verbal and written material supporting the discussion of sodium use in heart healthy nutrition. Review and explanation with models, verbal and written materials for utilization of the food label.   Exercise Physiology & Risk Factors: - Group verbal and written instruction with models to review the exercise physiology of the cardiovascular system and associated critical values. Details cardiovascular disease risk factors and the goals associated with each risk factor.   Aerobic Exercise & Resistance Training: - Gives group verbal and written discussion on the health impact of inactivity. On the components of aerobic and resistive training programs and the benefits of this training and how to safely progress through these programs.   Flexibility, Balance, General Exercise Guidelines: - Provides group verbal and written instruction on the benefits of flexibility and balance training programs. Provides general exercise guidelines with specific guidelines to those with heart or lung disease. Demonstration and skill practice provided.   Stress Management: - Provides group verbal and written instruction about the health risks of elevated stress, cause of high stress, and healthy ways to reduce stress.   Depression: - Provides group verbal and written instruction on the correlation between heart/lung disease and depressed mood, treatment options, and the stigmas associated with seeking treatment.   Anatomy & Physiology of the Heart: - Group verbal and written instruction and models  provide basic cardiac anatomy and physiology, with the coronary electrical and arterial systems. Review of: AMI, Angina, Valve disease, Heart Failure, Cardiac Arrhythmia, Pacemakers, and the ICD.   Cardiac Procedures: - Group verbal and written instruction and models to describe the testing methods done to diagnose heart disease. Reviews the outcomes of the test results. Describes the treatment choices: Medical Management, Angioplasty, or Coronary Bypass Surgery.   Cardiac Medications: - Group verbal and written instruction to review commonly prescribed medications for heart disease. Reviews the medication, class of the drug, and side effects. Includes the steps to properly store meds and maintain the prescription regimen.   Go Sex-Intimacy & Heart Disease, Get SMART - Goal Setting: - Group verbal and written instruction through game format to discuss heart disease and the return to sexual intimacy. Provides group  verbal and written material to discuss and apply goal setting through the application of the S.M.A.R.T. Method.   Other Matters of the Heart: - Provides group verbal, written materials and models to describe Heart Failure, Angina, Valve Disease, and Diabetes in the realm of heart disease. Includes description of the disease process and treatment options available to the cardiac patient.   Exercise & Equipment Safety: - Individual verbal instruction and demonstration of equipment use and safety with use of the equipment. Flowsheet Row Cardiac Rehab from 09/20/2016 in [facility] Cardiac and Pulmonary Rehab  Date  09/20/16  Educator  CE  Instruction Review Code  2- meets goals/outcomes      Infection Prevention: - Provides verbal and written material to individual with discussion of infection control including proper hand washing and proper equipment cleaning during exercise session. Flowsheet Row Cardiac Rehab from 09/20/2016 in [facility] Cardiac and Pulmonary Rehab  Date  09/20/16   Educator  CE  Instruction Review Code  2- meets goals/outcomes      Falls Prevention: - Provides verbal and written material to individual with discussion of falls prevention and safety. Flowsheet Row Cardiac Rehab from 09/20/2016 in [facility] Cardiac and Pulmonary Rehab  Date  09/20/16  Educator  CE  Instruction Review Code  2- meets goals/outcomes      Diabetes: - Individual verbal and written instruction to review signs/symptoms of diabetes, desired ranges of glucose level fasting, after meals and with exercise. Advice that pre and post exercise glucose checks will be done for 3 sessions at entry of program.    Knowledge Questionnaire Score:     Knowledge Questionnaire Score - 09/20/16 1218      Knowledge Questionnaire Score   Pre Score 22/28      Core Components/Risk Factors/Patient Goals at Admission:     Personal Goals and Risk Factors at Admission - 09/20/16 1212      Core Components/Risk Factors/Patient Goals on Admission    Weight Management Yes;Weight Maintenance   Intervention Weight Management: Develop a combined nutrition and exercise program designed to reach desired caloric intake, while maintaining appropriate intake of nutrient and fiber, sodium and fats, and appropriate energy expenditure required for the weight goal.;Weight Management: Provide education and appropriate resources to help participant work on and attain dietary goals.   Admit Weight 142 lb 6.4 oz (64.6 kg)   Expected Outcomes Weight Maintenance: Understanding of the daily nutrition guidelines, which includes 25-35% calories from fat, 7% or less cal from saturated fats, less than 200mg  cholesterol, less than 1.5gm of sodium, & 5 or more servings of fruits and vegetables daily   Sedentary Yes   Intervention Provide advice, education, support and counseling about physical activity/exercise needs.;Develop an individualized exercise prescription for aerobic and resistive training based on initial  evaluation findings, risk stratification, comorbidities and participant's personal goals.   Expected Outcomes Achievement of increased cardiorespiratory fitness and enhanced flexibility, muscular endurance and strength shown through measurements of functional capacity and personal statement of participant.   Increase Strength and Stamina Yes   Intervention Provide advice, education, support and counseling about physical activity/exercise needs.;Develop an individualized exercise prescription for aerobic and resistive training based on initial evaluation findings, risk stratification, comorbidities and participant's personal goals.   Expected Outcomes Achievement of increased cardiorespiratory fitness and enhanced flexibility, muscular endurance and strength shown through measurements of functional capacity and personal statement of participant.   Hypertension Yes   Intervention Provide education on lifestyle modifcations including regular physical activity/exercise, weight management, moderate sodium  restriction and increased consumption of fresh fruit, vegetables, and low fat dairy, alcohol moderation, and smoking cessation.;Monitor prescription use compliance.   Expected Outcomes Short Term: Continued assessment and intervention until BP is < 140/78mm HG in hypertensive participants. < 130/68mm HG in hypertensive participants with diabetes, heart failure or chronic kidney disease.;Long Term: Maintenance of blood pressure at goal levels.   Lipids Yes  Cannot tolerate Statins. HAs drasctically changed eating habits to help control Cholesterol Risk Factor   Intervention Provide education and support for participant on nutrition & aerobic/resistive exercise along with prescribed medications to achieve LDL 70mg , HDL >40mg .   Expected Outcomes Short Term: Participant states understanding of desired cholesterol values and is compliant with medications prescribed. Participant is following exercise prescription  and nutrition guidelines.;Long Term: Cholesterol controlled with medications as prescribed, with individualized exercise RX and with personalized nutrition plan. Value goals: LDL < 70mg , HDL > 40 mg.      Core Components/Risk Factors/Patient Goals Review:    Core Components/Risk Factors/Patient Goals at Discharge (Final Review):    ITP Comments:     ITP Comments    Row Name 09/20/16 1207           ITP Comments Medical REview completed.  Initial ITP created. Documnetation of diagnosis can be found in [facility] Encounter 08/09/2016          Comments: Initial ITP

## 2016-09-20 NOTE — Telephone Encounter (Signed)
-----   Message from Wellington Hampshire, MD sent at 09/20/2016 10:57 AM EST ----- Regarding: RE: Clearance for Cardiac Rehab with AAA No restrictions on exercise or weight lifting. Standard BP is fine. Thank you.   Dr. Fletcher Anon.   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  We noted that he has a AAA of about 3cm. Does he have any restrictions for exercise, including weight lifting limits (up to 10 lbs handweights)?  Also does he have an upper limit for blood pressure that you would like for [place] to follow (other than standard)?  Thanks for your help! Alberteen Sam, MA, ACSM RCEP 09/20/2016 10:47 AM

## 2016-09-25 DIAGNOSIS — E784 Other hyperlipidemia: Secondary | ICD-10-CM | POA: Diagnosis not present

## 2016-09-25 DIAGNOSIS — I10 Essential (primary) hypertension: Secondary | ICD-10-CM | POA: Diagnosis not present

## 2016-09-25 DIAGNOSIS — R7301 Impaired fasting glucose: Secondary | ICD-10-CM | POA: Diagnosis not present

## 2016-09-25 DIAGNOSIS — I480 Paroxysmal atrial fibrillation: Secondary | ICD-10-CM | POA: Diagnosis not present

## 2016-09-25 DIAGNOSIS — E038 Other specified hypothyroidism: Secondary | ICD-10-CM | POA: Diagnosis not present

## 2016-09-25 DIAGNOSIS — I25810 Atherosclerosis of coronary artery bypass graft(s) without angina pectoris: Secondary | ICD-10-CM | POA: Diagnosis not present

## 2016-09-29 ENCOUNTER — Encounter: Payer: Self-pay | Admitting: *Deleted

## 2016-09-29 DIAGNOSIS — Z48812 Encounter for surgical aftercare following surgery on the circulatory system: Secondary | ICD-10-CM | POA: Diagnosis not present

## 2016-09-29 DIAGNOSIS — Z951 Presence of aortocoronary bypass graft: Secondary | ICD-10-CM

## 2016-09-29 DIAGNOSIS — Z9889 Other specified postprocedural states: Secondary | ICD-10-CM | POA: Diagnosis not present

## 2016-09-29 NOTE — Progress Notes (Signed)
Cardiac Individual Treatment Plan  Patient Details  Name: Thomas Fuentes MRN: [id number] Date of Birth: September 10, 1940 Referring Provider:   Flowsheet Row Cardiac Rehab from 09/20/2016 in [facility] Cardiac and Pulmonary Rehab  Referring Provider  Kathlyn Sacramento MD      Initial Encounter Date:  Flowsheet Row Cardiac Rehab from 09/20/2016 in [facility] Cardiac and Pulmonary Rehab  Date  09/20/16  Referring Provider  Kathlyn Sacramento MD      Visit Diagnosis: S/P CABG x 4  Patient's Home Medications on Admission:  Current Outpatient Prescriptions:  .  amiodarone (PACERONE) 200 MG tablet, Take 1 tablet (200 mg total) by mouth daily., Disp: 30 tablet, Rfl: 1 .  aspirin EC 81 MG EC tablet, Take 1 tablet (81 mg total) by mouth daily., Disp: , Rfl:  .  ezetimibe (ZETIA) 10 MG tablet, Take 1 tablet (10 mg total) by mouth daily., Disp: 30 tablet, Rfl: 1 .  gabapentin (NEURONTIN) 600 MG tablet, Take 0.5 tablets (300 mg total) by mouth 3 (three) times daily., Disp: 45 tablet, Rfl: 0 .  levothyroxine (SYNTHROID, LEVOTHROID) 25 MCG tablet, Take 1 tablet (25 mcg total) by mouth daily before breakfast., Disp: 30 tablet, Rfl: 1 .  lisinopril (PRINIVIL,ZESTRIL) 5 MG tablet, Take 1 tablet (5 mg total) by mouth daily., Disp: 30 tablet, Rfl: 1 .  metoprolol tartrate (LOPRESSOR) 25 MG tablet, Take 0.5 tablets (12.5 mg total) by mouth 2 (two) times daily., Disp: 30 tablet, Rfl: 1 .  traMADol (ULTRAM) 50 MG tablet, Take 1 tablet (50 mg total) by mouth every 6 (six) hours as needed for moderate pain., Disp: 30 tablet, Rfl: 0  Past Medical History: Past Medical History:  Diagnosis Date  . AAA (abdominal aortic aneurysm) ([place])   . Chronic renal disease, stage III   . Coronary artery disease    a. MI 2000 s/p PCI and 2 stent placement to unknown arteries; b. Myoview negative; c. LHC 08/09/2016 o-pLAD 30%, mLAD 99%, OM2 50%, OM3 85%, pRCA 99%, mid RCA 60%, RPDA 60%, LVEDP mod elevated  . Diabetes mellitus without  complication ([place])   . Dyslipidemia   . Hyperlipidemia   . Hypertension   . Hypothyroidism   . Macular degeneration    bilateral  . Metabolic syndrome   . MI (myocardial infarction)   . Peripheral neuropathy ([place])   . Peripheral vascular disease ([place])     Tobacco Use: History  Smoking Status  . Former Smoker  . Years: 40.00  . Types: Cigarettes  . Quit date: 02/06/1999  Smokeless Tobacco  . Never Used    Labs: Recent Review Flowsheet Data    Labs for ITP Cardiac and Pulmonary Rehab Latest Ref Rng & Units 08/11/2016 08/11/2016 08/11/2016 08/11/2016 08/12/2016   Cholestrol 0 - 200 mg/dL - - - - -   LDLCALC 0 - 99 mg/dL - - - - -   HDL >40 mg/dL - - - - -   Trlycerides <150 mg/dL - - - - -   Hemoglobin A1c 4.8 - 5.6 % - - - - -   PHART 7.350 - 7.450 7.381 7.347(L) - 7.369 -   PCO2ART 32.0 - 48.0 mmHg 40.4 39.5 - 40.4 -   HCO3 20.0 - 28.0 mmol/L 24.2 21.5 - 23.2 -   TCO2 0 - 100 mmol/L 25 23 23 24 24    ACIDBASEDEF 0.0 - 2.0 mmol/L 1.0 4.0(H) - 2.0 -   O2SAT % 99.0 98.0 - 97.0 -  Exercise Target Goals:    Exercise Program Goal: Individual exercise prescription set with THRR, safety & activity barriers. Participant demonstrates ability to understand and report RPE using BORG scale, to self-measure pulse accurately, and to acknowledge the importance of the exercise prescription.  Exercise Prescription Goal: Starting with aerobic activity 30 plus minutes a day, 3 days per week for initial exercise prescription. Provide home exercise prescription and guidelines that participant acknowledges understanding prior to discharge.  Activity Barriers & Risk Stratification:     Activity Barriers & Cardiac Risk Stratification - 09/20/16 1218      Activity Barriers & Cardiac Risk Stratification   Activity Barriers Other (comment);Incisional Pain   Comments Abdominal Hernia.   Wil not do sit ups.  Occasional pain from vein graft   Cardiac Risk Stratification High      6  Minute Walk:     6 Minute Walk    Row Name 09/20/16 1407         6 Minute Walk   Phase Initial     Distance 1350 feet     Walk Time 6 minutes     # of Rest Breaks 0     MPH 2.56     METS 2.89     RPE 9     VO2 Peak 10.11     Symptoms No     Resting HR 46 bpm     Resting BP 126/64     Max Ex. HR 97 bpm     Max Ex. BP 132/70     2 Minute Post BP 130/60        Initial Exercise Prescription:     Initial Exercise Prescription - 09/20/16 1400      Date of Initial Exercise RX and Referring Provider   Date 09/20/16   Referring Provider Kathlyn Sacramento MD     Treadmill   MPH 2.5   Grade 0.5   Minutes 15   METs 3.09     NuStep   Level 2   Minutes 15   METs 2     REL-XR   Level 1   Minutes 15   METs 2     Prescription Details   Frequency (times per week) 3   Duration Progress to 45 minutes of aerobic exercise without signs/symptoms of physical distress     Intensity   THRR 40-80% of Max Heartrate 85-124   Ratings of Perceived Exertion 11-15   Perceived Dyspnea 0-4     Progression   Progression Continue to progress workloads to maintain intensity without signs/symptoms of physical distress.     Resistance Training   Training Prescription Yes   Weight 3 lbs   Reps 10-15      Perform Capillary Blood Glucose checks as needed.  Exercise Prescription Changes:     Exercise Prescription Changes    Row Name 09/20/16 1200             Exercise Review   Progression -  walk test results         Response to Exercise   Blood Pressure (Admit) 126/64       Blood Pressure (Exercise) 132/70       Blood Pressure (Exit) 130/60       Heart Rate (Admit) 46 bpm       Heart Rate (Exercise) 99 bpm       Heart Rate (Exit) 55 bpm       Oxygen Saturation (Admit) 99 %  Oxygen Saturation (Exercise) 98 %       Perceived Dyspnea (Exercise) 9       Symptoms none          Exercise Comments:   Discharge Exercise Prescription (Final Exercise Prescription  Changes):     Exercise Prescription Changes - 09/20/16 1200      Exercise Review   Progression --  walk test results     Response to Exercise   Blood Pressure (Admit) 126/64   Blood Pressure (Exercise) 132/70   Blood Pressure (Exit) 130/60   Heart Rate (Admit) 46 bpm   Heart Rate (Exercise) 99 bpm   Heart Rate (Exit) 55 bpm   Oxygen Saturation (Admit) 99 %   Oxygen Saturation (Exercise) 98 %   Perceived Dyspnea (Exercise) 9   Symptoms none      Nutrition:  Target Goals: Understanding of nutrition guidelines, daily intake of sodium 1500mg , cholesterol 200mg , calories 30% from fat and 7% or less from saturated fats, daily to have 5 or more servings of fruits and vegetables.  Biometrics:     Pre Biometrics - 09/20/16 1410      Pre Biometrics   Height 5' 7.1" (1.704 m)   Weight 142 lb 6.4 oz (64.6 kg)   Waist Circumference 35 inches   Hip Circumference 34.5 inches   Waist to Hip Ratio 1.01 %   BMI (Calculated) 22.3   Single Leg Stand 13.97 seconds       Nutrition Therapy Plan and Nutrition Goals:     Nutrition Therapy & Goals - 09/20/16 1209      Intervention Plan   Intervention Prescribe, educate and counsel regarding individualized specific dietary modifications aiming towards targeted core components such as weight, hypertension, lipid management, diabetes, heart failure and other comorbidities.   Expected Outcomes Short Term Goal: Understand basic principles of dietary content, such as calories, fat, sodium, cholesterol and nutrients.;Short Term Goal: A plan has been developed with personal nutrition goals set during dietitian appointment.;Long Term Goal: Adherence to prescribed nutrition plan.      Nutrition Discharge: Rate Your Plate Scores:     Nutrition Assessments - 09/20/16 1209      MEDFICTS Scores   Pre Score 16      Nutrition Goals Re-Evaluation:   Psychosocial: Target Goals: Acknowledge presence or absence of depression, maximize coping  skills, provide positive support system. Participant is able to verbalize types and ability to use techniques and skills needed for reducing stress and depression.  Initial Review & Psychosocial Screening:     Initial Psych Review & Screening - 09/20/16 1216      Initial Review   Current issues with --  States NONE     Family Dynamics   Good Support System? Yes  Wife and children that live close by     Barriers   Psychosocial barriers to participate in program There are no identifiable barriers or psychosocial needs.     Screening Interventions   Interventions Encouraged to exercise      Quality of Life Scores:     Quality of Life - 09/20/16 1216      Quality of Life Scores   Health/Function Pre 21 %   Socioeconomic Pre 21 %   Psych/Spiritual Pre 21 %   Family Pre 21 %   GLOBAL Pre 21 %      PHQ-9: Recent Review Flowsheet Data    Depression screen [facility] [place] 2/9 09/20/2016   Decreased Interest 0   Down, Depressed, Hopeless  0   PHQ - 2 Score 0   Altered sleeping 0   Tired, decreased energy 0   Change in appetite 0   Feeling bad or failure about yourself  0   Trouble concentrating 0   Moving slowly or fidgety/restless 0   Suicidal thoughts 0   PHQ-9 Score 0   Difficult doing work/chores Not difficult at all      Psychosocial Evaluation and Intervention:   Psychosocial Re-Evaluation:   Vocational Rehabilitation: Provide vocational rehab assistance to qualifying candidates.   Vocational Rehab Evaluation & Intervention:     Vocational Rehab - 09/20/16 1219      Initial Vocational Rehab Evaluation & Intervention   Assessment shows need for Vocational Rehabilitation No      Education: Education Goals: Education classes will be provided on a weekly basis, covering required topics. Participant will state understanding/return demonstration of topics presented.  Learning Barriers/Preferences:     Learning Barriers/Preferences - 09/20/16 1218       Learning Barriers/Preferences   Learning Barriers None   Learning Preferences None      Education Topics: General Nutrition Guidelines/Fats and Fiber: -Group instruction provided by verbal, written material, models and posters to present the general guidelines for heart healthy nutrition. Gives an explanation and review of dietary fats and fiber.   Controlling Sodium/Reading Food Labels: -Group verbal and written material supporting the discussion of sodium use in heart healthy nutrition. Review and explanation with models, verbal and written materials for utilization of the food label.   Exercise Physiology & Risk Factors: - Group verbal and written instruction with models to review the exercise physiology of the cardiovascular system and associated critical values. Details cardiovascular disease risk factors and the goals associated with each risk factor.   Aerobic Exercise & Resistance Training: - Gives group verbal and written discussion on the health impact of inactivity. On the components of aerobic and resistive training programs and the benefits of this training and how to safely progress through these programs.   Flexibility, Balance, General Exercise Guidelines: - Provides group verbal and written instruction on the benefits of flexibility and balance training programs. Provides general exercise guidelines with specific guidelines to those with heart or lung disease. Demonstration and skill practice provided.   Stress Management: - Provides group verbal and written instruction about the health risks of elevated stress, cause of high stress, and healthy ways to reduce stress.   Depression: - Provides group verbal and written instruction on the correlation between heart/lung disease and depressed mood, treatment options, and the stigmas associated with seeking treatment.   Anatomy & Physiology of the Heart: - Group verbal and written instruction and models provide basic  cardiac anatomy and physiology, with the coronary electrical and arterial systems. Review of: AMI, Angina, Valve disease, Heart Failure, Cardiac Arrhythmia, Pacemakers, and the ICD.   Cardiac Procedures: - Group verbal and written instruction and models to describe the testing methods done to diagnose heart disease. Reviews the outcomes of the test results. Describes the treatment choices: Medical Management, Angioplasty, or Coronary Bypass Surgery.   Cardiac Medications: - Group verbal and written instruction to review commonly prescribed medications for heart disease. Reviews the medication, class of the drug, and side effects. Includes the steps to properly store meds and maintain the prescription regimen.   Go Sex-Intimacy & Heart Disease, Get SMART - Goal Setting: - Group verbal and written instruction through game format to discuss heart disease and the return to sexual intimacy. Provides group  verbal and written material to discuss and apply goal setting through the application of the S.M.A.R.T. Method.   Other Matters of the Heart: - Provides group verbal, written materials and models to describe Heart Failure, Angina, Valve Disease, and Diabetes in the realm of heart disease. Includes description of the disease process and treatment options available to the cardiac patient.   Exercise & Equipment Safety: - Individual verbal instruction and demonstration of equipment use and safety with use of the equipment. Flowsheet Row Cardiac Rehab from 09/20/2016 in [facility] Cardiac and Pulmonary Rehab  Date  09/20/16  Educator  CE  Instruction Review Code  2- meets goals/outcomes      Infection Prevention: - Provides verbal and written material to individual with discussion of infection control including proper hand washing and proper equipment cleaning during exercise session. Flowsheet Row Cardiac Rehab from 09/20/2016 in [facility] Cardiac and Pulmonary Rehab  Date  09/20/16  Educator  CE   Instruction Review Code  2- meets goals/outcomes      Falls Prevention: - Provides verbal and written material to individual with discussion of falls prevention and safety. Flowsheet Row Cardiac Rehab from 09/20/2016 in [facility] Cardiac and Pulmonary Rehab  Date  09/20/16  Educator  CE  Instruction Review Code  2- meets goals/outcomes      Diabetes: - Individual verbal and written instruction to review signs/symptoms of diabetes, desired ranges of glucose level fasting, after meals and with exercise. Advice that pre and post exercise glucose checks will be done for 3 sessions at entry of program.    Knowledge Questionnaire Score:     Knowledge Questionnaire Score - 09/20/16 1218      Knowledge Questionnaire Score   Pre Score 22/28      Core Components/Risk Factors/Patient Goals at Admission:     Personal Goals and Risk Factors at Admission - 09/20/16 1212      Core Components/Risk Factors/Patient Goals on Admission    Weight Management Yes;Weight Maintenance   Intervention Weight Management: Develop a combined nutrition and exercise program designed to reach desired caloric intake, while maintaining appropriate intake of nutrient and fiber, sodium and fats, and appropriate energy expenditure required for the weight goal.;Weight Management: Provide education and appropriate resources to help participant work on and attain dietary goals.   Admit Weight 142 lb 6.4 oz (64.6 kg)   Expected Outcomes Weight Maintenance: Understanding of the daily nutrition guidelines, which includes 25-35% calories from fat, 7% or less cal from saturated fats, less than 200mg  cholesterol, less than 1.5gm of sodium, & 5 or more servings of fruits and vegetables daily   Sedentary Yes   Intervention Provide advice, education, support and counseling about physical activity/exercise needs.;Develop an individualized exercise prescription for aerobic and resistive training based on initial evaluation findings,  risk stratification, comorbidities and participant's personal goals.   Expected Outcomes Achievement of increased cardiorespiratory fitness and enhanced flexibility, muscular endurance and strength shown through measurements of functional capacity and personal statement of participant.   Increase Strength and Stamina Yes   Intervention Provide advice, education, support and counseling about physical activity/exercise needs.;Develop an individualized exercise prescription for aerobic and resistive training based on initial evaluation findings, risk stratification, comorbidities and participant's personal goals.   Expected Outcomes Achievement of increased cardiorespiratory fitness and enhanced flexibility, muscular endurance and strength shown through measurements of functional capacity and personal statement of participant.   Hypertension Yes   Intervention Provide education on lifestyle modifcations including regular physical activity/exercise, weight management, moderate sodium  restriction and increased consumption of fresh fruit, vegetables, and low fat dairy, alcohol moderation, and smoking cessation.;Monitor prescription use compliance.   Expected Outcomes Short Term: Continued assessment and intervention until BP is < 140/13mm HG in hypertensive participants. < 130/23mm HG in hypertensive participants with diabetes, heart failure or chronic kidney disease.;Long Term: Maintenance of blood pressure at goal levels.   Lipids Yes  Cannot tolerate Statins. HAs drasctically changed eating habits to help control Cholesterol Risk Factor   Intervention Provide education and support for participant on nutrition & aerobic/resistive exercise along with prescribed medications to achieve LDL 70mg , HDL >40mg .   Expected Outcomes Short Term: Participant states understanding of desired cholesterol values and is compliant with medications prescribed. Participant is following exercise prescription and nutrition  guidelines.;Long Term: Cholesterol controlled with medications as prescribed, with individualized exercise RX and with personalized nutrition plan. Value goals: LDL < 70mg , HDL > 40 mg.      Core Components/Risk Factors/Patient Goals Review:    Core Components/Risk Factors/Patient Goals at Discharge (Final Review):    ITP Comments:     ITP Comments    Row Name 09/20/16 1207 09/29/16 0558         ITP Comments Medical REview completed.  Initial ITP created. Documnetation of diagnosis can be found in [facility] Encounter 08/09/2016 30 day review. Continue with ITP unless directed changes per Medical Director review.  Has not attended session since Medical REview         Comments:

## 2016-09-29 NOTE — Progress Notes (Signed)
Daily Session Note  Patient Details  Name: Thomas Fuentes MRN: [id number] Date of Birth: Mar 25, 1941 Referring Provider:   Flowsheet Row Cardiac Rehab from 09/20/2016 in [facility] Cardiac and Pulmonary Rehab  Referring Provider  Kathlyn Sacramento MD      Encounter Date: 09/29/2016  Check In:     Session Check In - 09/29/16 0855      Check-In   Location [facility]-Cardiac & Pulmonary Rehab   Staff Present Nyoka Cowden, RN, BSN, Willette Pa, MA, ACSM RCEP, Exercise Physiologist;Erikah Thumm Oletta Darter, [place], ACSM CEP, Exercise Physiologist   Supervising physician immediately available to respond to emergencies See telemetry face sheet for immediately available ER MD   Medication changes reported     No   Fall or balance concerns reported    No   Warm-up and Cool-down Performed on first and last piece of equipment   Resistance Training Performed Yes   VAD Patient? No     Pain Assessment   Currently in Pain? No/denies   Multiple Pain Sites No         Goals Met:  Independence with exercise equipment Exercise tolerated well No report of cardiac concerns or symptoms Strength training completed today  Goals Unmet:  Not Applicable  Comments: First full day of exercise!  Patient was oriented to gym and equipment including functions, settings, policies, and procedures.  Patient's individual exercise prescription and treatment plan were reviewed.  All starting workloads were established based on the results of the 6 minute walk test done at initial orientation visit.  The plan for exercise progression was also introduced and progression will be customized based on patient's performance and goals.    Dr. Emily Filbert is Medical Director for [place] and [facility].

## 2016-09-30 DIAGNOSIS — I480 Paroxysmal atrial fibrillation: Secondary | ICD-10-CM | POA: Diagnosis not present

## 2016-09-30 DIAGNOSIS — I712 Thoracic aortic aneurysm, without rupture: Secondary | ICD-10-CM | POA: Diagnosis not present

## 2016-09-30 DIAGNOSIS — Z1389 Encounter for screening for other disorder: Secondary | ICD-10-CM | POA: Diagnosis not present

## 2016-09-30 DIAGNOSIS — R7301 Impaired fasting glucose: Secondary | ICD-10-CM | POA: Diagnosis not present

## 2016-09-30 DIAGNOSIS — Z0000 Encounter for general adult medical examination without abnormal findings: Secondary | ICD-10-CM | POA: Diagnosis not present

## 2016-09-30 DIAGNOSIS — I10 Essential (primary) hypertension: Secondary | ICD-10-CM | POA: Diagnosis not present

## 2016-09-30 DIAGNOSIS — I252 Old myocardial infarction: Secondary | ICD-10-CM | POA: Diagnosis not present

## 2016-09-30 DIAGNOSIS — I7389 Other specified peripheral vascular diseases: Secondary | ICD-10-CM | POA: Diagnosis not present

## 2016-09-30 DIAGNOSIS — I25810 Atherosclerosis of coronary artery bypass graft(s) without angina pectoris: Secondary | ICD-10-CM | POA: Diagnosis not present

## 2016-09-30 DIAGNOSIS — Z6823 Body mass index (BMI) 23.0-23.9, adult: Secondary | ICD-10-CM | POA: Diagnosis not present

## 2016-09-30 DIAGNOSIS — E784 Other hyperlipidemia: Secondary | ICD-10-CM | POA: Diagnosis not present

## 2016-09-30 DIAGNOSIS — I714 Abdominal aortic aneurysm, without rupture: Secondary | ICD-10-CM | POA: Diagnosis not present

## 2016-10-01 ENCOUNTER — Encounter: Payer: Medicare Other | Attending: Cardiovascular Disease | Admitting: *Deleted

## 2016-10-01 ENCOUNTER — Other Ambulatory Visit: Payer: Self-pay | Admitting: Internal Medicine

## 2016-10-01 DIAGNOSIS — I713 Abdominal aortic aneurysm, ruptured, unspecified: Secondary | ICD-10-CM

## 2016-10-01 DIAGNOSIS — Z951 Presence of aortocoronary bypass graft: Secondary | ICD-10-CM | POA: Diagnosis not present

## 2016-10-01 DIAGNOSIS — Z9889 Other specified postprocedural states: Secondary | ICD-10-CM | POA: Insufficient documentation

## 2016-10-01 DIAGNOSIS — Z48812 Encounter for surgical aftercare following surgery on the circulatory system: Secondary | ICD-10-CM | POA: Insufficient documentation

## 2016-10-01 NOTE — Progress Notes (Signed)
Daily Session Note  Patient Details  Name: SULEIMAN FINIGAN MRN: [id number] Date of Birth: May 26, 1941 Referring Provider:   Flowsheet Row Cardiac Rehab from 09/20/2016 in [facility] Cardiac and Pulmonary Rehab  Referring Provider  Kathlyn Sacramento MD      Encounter Date: 10/01/2016  Check In:     Session Check In - 10/01/16 0828      Check-In   Location [facility]-Cardiac & Pulmonary Rehab   Staff Present Gerlene Burdock, RN, Levie Heritage, MA, ACSM RCEP, Exercise Physiologist;Susanne Bice, RN, BSN, CCRP   Supervising physician immediately available to respond to emergencies See telemetry face sheet for immediately available ER MD   Medication changes reported     No   Fall or balance concerns reported    No   Warm-up and Cool-down Performed on first and last piece of equipment   Resistance Training Performed Yes   VAD Patient? No     Pain Assessment   Currently in Pain? No/denies         Goals Met:  Proper associated with RPD/PD & O2 Sat Exercise tolerated well  Goals Unmet:  Not Applicable  Comments:     Dr. Emily Filbert is Medical Director for [place] and [facility].

## 2016-10-01 NOTE — Progress Notes (Signed)
Daily Session Note  Patient Details  Name: Gunther A Full MRN: [id number] Date of Birth: 01/22/1941 Referring Provider:   Flowsheet Row Cardiac Rehab from 09/20/2016 in [facility] Cardiac and Pulmonary Rehab  Referring Provider  Arida, Muhammad MD      Encounter Date: 10/01/2016  Check In:     Session Check In - 10/01/16 0828      Check-In   Location [facility]-Cardiac & Pulmonary Rehab   Staff Present Robertha Staples, RN, BSN;Jessica Hawkins, MA, ACSM RCEP, Exercise Physiologist;Susanne Bice, RN, BSN, CCRP   Supervising physician immediately available to respond to emergencies See telemetry face sheet for immediately available ER MD   Medication changes reported     No   Fall or balance concerns reported    No   Warm-up and Cool-down Performed on first and last piece of equipment   Resistance Training Performed Yes   VAD Patient? No     Pain Assessment   Currently in Pain? No/denies         Goals Met:  Proper associated with RPD/PD & O2 Sat Exercise tolerated well  Goals Unmet:  Not Applicable  Comments:     Dr. Mark Miller is Medical Director for [facility] and [facility]. 

## 2016-10-04 ENCOUNTER — Encounter: Payer: Medicare Other | Admitting: *Deleted

## 2016-10-04 DIAGNOSIS — Z951 Presence of aortocoronary bypass graft: Secondary | ICD-10-CM

## 2016-10-04 DIAGNOSIS — Z9889 Other specified postprocedural states: Secondary | ICD-10-CM | POA: Diagnosis not present

## 2016-10-04 DIAGNOSIS — Z48812 Encounter for surgical aftercare following surgery on the circulatory system: Secondary | ICD-10-CM | POA: Diagnosis not present

## 2016-10-04 NOTE — Progress Notes (Signed)
Daily Session Note  Patient Details  Name: ELISON WORREL MRN: [id number] Date of Birth: 21-Feb-1941 Referring Provider:   Flowsheet Row Cardiac Rehab from 09/20/2016 in [facility] Cardiac and Pulmonary Rehab  Referring Provider  Kathlyn Sacramento MD      Encounter Date: 10/04/2016  Check In:     Session Check In - 10/04/16 2094      Check-In   Location [facility]-Cardiac & Pulmonary Rehab   Staff Present Alberteen Sam, MA, ACSM RCEP, Exercise Physiologist;Mary Kellie Shropshire, RN, BSN, [place], BS, ACSM CEP, Exercise Physiologist   Supervising physician immediately available to respond to emergencies See telemetry face sheet for immediately available ER MD   Medication changes reported     No   Fall or balance concerns reported    No   Warm-up and Cool-down Performed on first and last piece of equipment   Resistance Training Performed Yes   VAD Patient? No     Pain Assessment   Currently in Pain? No/denies   Multiple Pain Sites No         Goals Met:  Exercise tolerated well No report of cardiac concerns or symptoms Strength training completed today  Goals Unmet:  Not Applicable  Comments: Pt able to follow exercise prescription today without complaint.  Will continue to monitor for progression.    Dr. Emily Filbert is Medical Director for [place] and [facility].

## 2016-10-06 ENCOUNTER — Encounter: Payer: Medicare Other | Admitting: *Deleted

## 2016-10-06 DIAGNOSIS — Z9889 Other specified postprocedural states: Secondary | ICD-10-CM | POA: Diagnosis not present

## 2016-10-06 DIAGNOSIS — Z48812 Encounter for surgical aftercare following surgery on the circulatory system: Secondary | ICD-10-CM | POA: Diagnosis not present

## 2016-10-06 DIAGNOSIS — Z951 Presence of aortocoronary bypass graft: Secondary | ICD-10-CM | POA: Diagnosis not present

## 2016-10-06 NOTE — Progress Notes (Signed)
Daily Session Note  Patient Details  Name: Thomas Fuentes MRN: [id number] Date of Birth: 05-26-1941 Referring Provider:   Flowsheet Row Cardiac Rehab from 09/20/2016 in [facility] Re[facility]  Referring Provider  Kathlyn Sacramento MD      Encounter Date: 10/06/2016  Check In:     Session Check In - 10/06/16 0853      Check-In   Location [facility]-Cardiac & Pulmonary Rehab   Staff Present Alberteen Sam, MA, ACSM RCEP, Exercise Physiologist;Carroll Enterkin, RN, Vickki Hearing, BA, ACSM CEP, Exercise Physiologist   Supervising physician immediately available to respond to emergencies See telemetry face sheet for immediately available ER MD   Medication changes reported     No   Fall or balance concerns reported    No   Warm-up and Cool-down Performed on first and last piece of equipment   Resistance Training Performed Yes   VAD Patient? No     Pain Assessment   Currently in Pain? No/denies   Multiple Pain Sites No         Goals Met:  Independence with exercise equipment Exercise tolerated well No report of cardiac concerns or symptoms Strength training completed today  Goals Unmet:  Not Applicable  Comments: Pt able to follow exercise prescription today without complaint.  Will continue to monitor for progression.    Dr. Emily Filbert is Medical Director for [facility] and [facility].

## 2016-10-07 ENCOUNTER — Encounter: Payer: Self-pay | Admitting: Cardiothoracic Surgery

## 2016-10-07 ENCOUNTER — Ambulatory Visit (INDEPENDENT_AMBULATORY_CARE_PROVIDER_SITE_OTHER): Payer: Self-pay | Admitting: Cardiothoracic Surgery

## 2016-10-07 VITALS — BP 113/65 | HR 45 | Resp 16 | Ht 68.0 in | Wt 141.4 lb

## 2016-10-07 DIAGNOSIS — I2510 Atherosclerotic heart disease of native coronary artery without angina pectoris: Secondary | ICD-10-CM

## 2016-10-07 DIAGNOSIS — I214 Non-ST elevation (NSTEMI) myocardial infarction: Secondary | ICD-10-CM

## 2016-10-07 DIAGNOSIS — Z951 Presence of aortocoronary bypass graft: Secondary | ICD-10-CM

## 2016-10-07 NOTE — Progress Notes (Signed)
[address]       [place] [address]             [phone]      Darean A T[facility]ter [place] Medical Record [id number] Date of Birth: 04-11-1941  Referring: Wellington Hampshire, MD Primary Care: Marton Redwood, MD  Chief Complaint:   POST OP FOLLOW UP PHYSICIAN:  Lanelle Bal, MD    DATE OF BIRTH:  1941-06-23 DATE OF PROCEDURE:  08/11/2016 OPERATIVE REPORT PREOPERATIVE DIAGNOSES:  Coronary occlusive disease with unstable angina. POSTOPERATIVE DIAGNOSIS:  Coronary occlusive disease with unstable angina. PROCEDURE PERFORMED:  Coronary artery bypass grafting x4 with left internal mammary to the left anterior descending coronary artery, sequential reverse saphenous vein graft to the first obtuse marginal and distal circumflex, reverse saphenous vein graft to the posterior descending coronary artery with endoscopic right greater saphenous vein harvesting, thigh and calf. SURGEON:  Lanelle Bal, MD.  History of Present Illness:     Patient doing well following surgery. He is returning to near-normal activities he's had no recurrent angina or evidence of congestive heart failure.     Past Medical History:  Diagnosis Date  . AAA (abdominal aortic aneurysm) ([place])   . Chronic renal disease, stage III   . Coronary artery disease    a. MI 2000 s/p PCI and 2 stent placement to unknown arteries; b. Myoview negative; c. LHC 08/09/2016 o-pLAD 30%, mLAD 99%, OM2 50%, OM3 85%, pRCA 99%, mid RCA 60%, RPDA 60%, LVEDP mod elevated  . Diabetes mellitus without complication ([place])   . Dyslipidemia   . Hyperlipidemia   . Hypertension   . Hypothyroidism   . Macular degeneration    bilateral  . Metabolic syndrome   . MI (myocardial infarction)   . Peripheral neuropathy ([place])   . Peripheral vascular disease ([place])      History  Smoking Status  . Former Smoker  . Years: 40.00  . Types: Cigarettes  . Quit date: 02/06/1999  Smokeless Tobacco  . Never Used      History  Alcohol Use  . Yes    Comment: occasional     Allergies  Allergen Reactions  . Lidocaine Anaphylaxis    novacaine and lidocaine  . Statins Nausea And Vomiting    GI symptoms on almost all statins    Current Outpatient Prescriptions  Medication Sig Dispense Refill  . amiodarone (PACERONE) 200 MG tablet Take 1 tablet (200 mg total) by mouth daily. 30 tablet 1  . aspirin EC 81 MG EC tablet Take 1 tablet (81 mg total) by mouth daily.    Marland Kitchen ezetimibe (ZETIA) 10 MG tablet Take 1 tablet (10 mg total) by mouth daily. 30 tablet 1  . gabapentin (NEURONTIN) 600 MG tablet Take 0.5 tablets (300 mg total) by mouth 3 (three) times daily. 45 tablet 0  . levothyroxine (SYNTHROID, LEVOTHROID) 25 MCG tablet Take 1 tablet (25 mcg total) by mouth daily before breakfast. 30 tablet 1  . lisinopril (PRINIVIL,ZESTRIL) 5 MG tablet Take 1 tablet (5 mg total) by mouth daily. 30 tablet 1  . metoprolol tartrate (LOPRESSOR) 25 MG tablet Take 0.5 tablets (12.5 mg total) by mouth 2 (two) times daily. 30 tablet 1   No current facility-administered medications for this visit.     Physical Exam: BP 113/65 (BP Location: Right Arm, Patient Position: Sitting, Cuff Size: Normal)   Pulse (!) 45   Resp 16   Ht 5\' 8"  (1.727 m)  Wt 141 lb 6.4 oz (64.1 kg)   SpO2 97% Comment: ON RA  BMI 21.50 kg/m   General appearance: alert and cooperative Neurologic: intact Heart: regular rate and rhythm, S1, S2 normal, no murmur, click, rub or gallop Lungs: clear to auscultation bilaterally Abdomen: soft, non-tender; bowel sounds normal; no masses,  no organomegaly Extremities: extremities normal, atraumatic, no cyanosis or edema and Homans sign is negative, no sign of DVT Wound:  Sternum is stable and well-healed   Diagnostic Studies & Laboratory data:     Recent Radiology Findings:   No results found.    Recent Lab Findings: Lab Results  Component Value Date   WBC 8.5 08/15/2016   HGB 8.5 (L) 08/15/2016    HCT 26.7 (L) 08/15/2016   PLT 210 08/15/2016   GLUCOSE 114 (H) 08/15/2016   CHOL 192 08/09/2016   TRIG 268 (H) 08/09/2016   HDL 27 (L) 08/09/2016   LDLCALC 111 (H) 08/09/2016   ALT 11 (L) 08/10/2016   AST 19 08/10/2016   NA 137 08/15/2016   K 4.1 08/15/2016   CL 103 08/15/2016   CREATININE 1.13 08/15/2016   BUN 17 08/15/2016   CO2 26 08/15/2016   TSH 6.215 (H) 08/09/2016   INR 1.41 08/11/2016   HGBA1C 6.0 (H) 08/10/2016      Assessment / Plan:      Patient doing well following recent coronary artery bypass grafting. He is now enrolled in cardiac rehabilitation program and doing well with this and plans to complete 12 weeks I've cautioned him about doing any lifting over 20-25 pounds for 3 months postop Plan to see him back as needed.    [name] MD      [address] [place] [address] Office [phone]   [place]  10/07/2016 1:17 PM

## 2016-10-08 ENCOUNTER — Ambulatory Visit
Admission: RE | Admit: 2016-10-08 | Discharge: 2016-10-08 | Disposition: A | Discharge status: Home / Self Care | Payer: Medicare Other | Source: Ambulatory Visit | Attending: Internal Medicine | Admitting: Internal Medicine

## 2016-10-08 DIAGNOSIS — I714 Abdominal aortic aneurysm, without rupture: Secondary | ICD-10-CM | POA: Diagnosis not present

## 2016-10-08 DIAGNOSIS — Z136 Encounter for screening for cardiovascular disorders: Secondary | ICD-10-CM | POA: Diagnosis not present

## 2016-10-08 DIAGNOSIS — I713 Abdominal aortic aneurysm, ruptured, unspecified: Secondary | ICD-10-CM

## 2016-10-11 ENCOUNTER — Encounter: Payer: Medicare Other | Admitting: *Deleted

## 2016-10-11 DIAGNOSIS — Z951 Presence of aortocoronary bypass graft: Secondary | ICD-10-CM | POA: Diagnosis not present

## 2016-10-11 DIAGNOSIS — Z9889 Other specified postprocedural states: Secondary | ICD-10-CM | POA: Diagnosis not present

## 2016-10-11 DIAGNOSIS — Z48812 Encounter for surgical aftercare following surgery on the circulatory system: Secondary | ICD-10-CM | POA: Diagnosis not present

## 2016-10-11 NOTE — Progress Notes (Signed)
Daily Session Note  Patient Details  Name: SWAYZE PRIES MRN: [id number] Date of Birth: [birth year] Referring Provider:   Flowsheet Row Cardiac Rehab from 09/20/2016 in [facility] Re[facility]  Referring Provider  Kathlyn Sacramento MD      Encounter Date: 10/11/2016  Check In:     Session Check In - 10/11/16 0802      Check-In   Staff Present Nyoka Cowden, RN, BSN, MA;Lilyonna Steidle, RN, BSN, CCRP;Carroll Enterkin, RN, BSN   Supervising physician immediately available to respond to emergencies See telemetry face sheet for immediately available ER MD   Medication changes reported     No   Fall or balance concerns reported    No   Warm-up and Cool-down Performed on first and last piece of equipment   Resistance Training Performed Yes   VAD Patient? No     Pain Assessment   Currently in Pain? No/denies         Goals Met:  Exercise tolerated well No report of cardiac concerns or symptoms Strength training completed today  Goals Unmet:  Not Applicable  Comments: Doing well with exercise prescription progression.    Dr. Emily Filbert is Medical Director for [facility] and [facility].

## 2016-10-13 ENCOUNTER — Encounter: Payer: Medicare Other | Admitting: *Deleted

## 2016-10-13 DIAGNOSIS — Z951 Presence of aortocoronary bypass graft: Secondary | ICD-10-CM | POA: Diagnosis not present

## 2016-10-13 DIAGNOSIS — Z48812 Encounter for surgical aftercare following surgery on the circulatory system: Secondary | ICD-10-CM | POA: Diagnosis not present

## 2016-10-13 DIAGNOSIS — Z9889 Other specified postprocedural states: Secondary | ICD-10-CM | POA: Diagnosis not present

## 2016-10-13 NOTE — Progress Notes (Signed)
Daily Session Note  Patient Details  Name: SAVVAS ROPER MRN: [id number] Date of Birth: March 17, 1941 Referring Provider:   Flowsheet Row Cardiac Rehab from 09/20/2016 in [facility] Cardiac and Pulmonary Rehab  Referring Provider  Kathlyn Sacramento MD      Encounter Date: 10/13/2016  Check In:     Session Check In - 10/13/16 8118      Check-In   Location [facility]   Staff Present Alberteen Sam, MA, ACSM RCEP, Exercise Physiologist;Susanne Bice, RN, BSN, Lance Sell, BA, ACSM CEP, Exercise Physiologist   Supervising physician immediately available to respond to emergencies See telemetry face sheet for immediately available ER MD   Medication changes reported     No   Fall or balance concerns reported    No   Warm-up and Cool-down Performed on first and last piece of equipment   Resistance Training Performed Yes   VAD Patient? No     Pain Assessment   Currently in Pain? No/denies   Multiple Pain Sites No         Goals Met:  Independence with exercise equipment Exercise tolerated well No report of cardiac concerns or symptoms Strength training completed today  Goals Unmet:  Not Applicable  Comments: Pt able to follow exercise prescription today without complaint.  Will continue to monitor for progression. Reviewed home exercise with pt today.  Pt plans to walk at home for exercise.  Reviewed THR, pulse, RPE, sign and symptoms, NTG use, and when to call 911 or MD.  Also discussed weather considerations and indoor options.  Pt voiced understanding. [facility] will be switching classes to the 4 pm class starting tomorrow.    Dr. Emily Filbert is Medical Director for [facility] and [facility].

## 2016-10-14 DIAGNOSIS — Z9889 Other specified postprocedural states: Secondary | ICD-10-CM | POA: Diagnosis not present

## 2016-10-14 DIAGNOSIS — Z951 Presence of aortocoronary bypass graft: Secondary | ICD-10-CM

## 2016-10-14 DIAGNOSIS — Z48812 Encounter for surgical aftercare following surgery on the circulatory system: Secondary | ICD-10-CM | POA: Diagnosis not present

## 2016-10-14 NOTE — Progress Notes (Signed)
Daily Session Note  Patient Details  Name: SANTANA EDELL MRN: [id number] Date of Birth: 04-01-1941 Referring Provider:   Flowsheet Row Cardiac Rehab from 09/20/2016 in [facility] Cardiac and Pulmonary Rehab  Referring Provider  Kathlyn Sacramento MD      Encounter Date: 10/14/2016  Check In:     Session Check In - 10/14/16 1633      Check-In   Location [facility]   Staff Present Levell July RN Moises Blood, BS, ACSM CEP, Exercise Physiologist;Amanda Oletta Darter, [place], ACSM CEP, Exercise Physiologist   Supervising physician immediately available to respond to emergencies See telemetry face sheet for immediately available ER MD   Medication changes reported     No   Fall or balance concerns reported    No   Warm-up and Cool-down Performed on first and last piece of equipment   Resistance Training Performed Yes   VAD Patient? No     Pain Assessment   Currently in Pain? No/denies         Goals Met:  Independence with exercise equipment Exercise tolerated well No report of cardiac concerns or symptoms Strength training completed today  Goals Unmet:  Not Applicable  Comments: Pt able to follow exercise prescription today without complaint.  Will continue to monitor for progression.    Dr. Emily Filbert is Medical Director for [place] and [facility].

## 2016-10-18 ENCOUNTER — Encounter: Payer: Medicare Other | Admitting: *Deleted

## 2016-10-18 DIAGNOSIS — Z951 Presence of aortocoronary bypass graft: Secondary | ICD-10-CM

## 2016-10-18 DIAGNOSIS — Z48812 Encounter for surgical aftercare following surgery on the circulatory system: Secondary | ICD-10-CM | POA: Diagnosis not present

## 2016-10-18 DIAGNOSIS — Z9889 Other specified postprocedural states: Secondary | ICD-10-CM | POA: Diagnosis not present

## 2016-10-18 NOTE — Progress Notes (Signed)
Daily Session Note  Patient Details  Name: Thomas Fuentes MRN: [id number] Date of Birth: 1940/12/13 Referring Provider:   Flowsheet Row Cardiac Rehab from 09/20/2016 in [facility] Cardiac and Pulmonary Rehab  Referring Provider  Kathlyn Sacramento MD      Encounter Date: 10/18/2016  Check In:     Session Check In - 10/18/16 1620      Check-In   Location [facility]   Staff Present Gerlene Burdock, RN, BSN;Kerman Pfost, RN, BSN, Laveda Norman, BS, ACSM CEP, Exercise Physiologist   Supervising physician immediately available to respond to emergencies See telemetry face sheet for immediately available ER MD   Medication changes reported     No   Fall or balance concerns reported    No   Warm-up and Cool-down Performed on first and last piece of equipment   Resistance Training Performed Yes   VAD Patient? No     Pain Assessment   Currently in Pain? No/denies   Multiple Pain Sites No         Goals Met:  Exercise tolerated well No report of cardiac concerns or symptoms Strength training completed today  Goals Unmet:  Not Applicable  Comments: Doing well with exercise prescription progression.    Dr. Emily Filbert is Medical Director for [place] and [facility].

## 2016-10-20 DIAGNOSIS — Z48812 Encounter for surgical aftercare following surgery on the circulatory system: Secondary | ICD-10-CM | POA: Diagnosis not present

## 2016-10-20 DIAGNOSIS — H353222 Exudative age-related macular degeneration, left eye, with inactive choroidal neovascularization: Secondary | ICD-10-CM | POA: Diagnosis not present

## 2016-10-20 DIAGNOSIS — Z951 Presence of aortocoronary bypass graft: Secondary | ICD-10-CM

## 2016-10-20 DIAGNOSIS — Z9889 Other specified postprocedural states: Secondary | ICD-10-CM | POA: Diagnosis not present

## 2016-10-20 NOTE — Progress Notes (Signed)
Daily Session Note  Patient Details  Name: Thomas Fuentes MRN: [id number] Date of Birth: September 26, 1940 Referring Provider:   Flowsheet Row Cardiac Rehab from 09/20/2016 in [facility] Cardiac and Pulmonary Rehab  Referring Provider  Kathlyn Sacramento MD      Encounter Date: 10/20/2016  Check In:     Session Check In - 10/20/16 1711      Check-In   Warm-up and Cool-down Performed on first and last piece of equipment   Resistance Training Performed Yes   VAD Patient? No     Pain Assessment   Currently in Pain? No/denies   Multiple Pain Sites No         Goals Met:  Exercise tolerated well  Goals Unmet:  Not Applicable  Comments: Patient completed exercise prescription and all exercise goals during rehab session. The exercise was tolerated well and the patient is progressing in the program.    Dr. Emily Filbert is Medical Director for [place] and [facility].

## 2016-10-21 DIAGNOSIS — Z9889 Other specified postprocedural states: Secondary | ICD-10-CM | POA: Diagnosis not present

## 2016-10-21 DIAGNOSIS — Z951 Presence of aortocoronary bypass graft: Secondary | ICD-10-CM | POA: Diagnosis not present

## 2016-10-21 DIAGNOSIS — Z48812 Encounter for surgical aftercare following surgery on the circulatory system: Secondary | ICD-10-CM | POA: Diagnosis not present

## 2016-10-21 NOTE — Progress Notes (Signed)
Daily Session Note  Patient Details  Name: Thomas Fuentes MRN: [id number] Date of Birth: Jun 20, 1941 Referring Provider:   Flowsheet Row Cardiac Rehab from 09/20/2016 in [facility] Card[facility] and Pulmonary Rehab  Referring Provider  Kathlyn Sacramento MD      Encounter Date: 10/21/2016  Check In:     Session Check In - 10/21/16 1659      Check-In   Location [facility]   Staff Present Earlean Shawl, BS, ACSM CEP, Exercise Physiologist;Carroll Enterkin, RN, Vickki Hearing, BA, ACSM CEP, Exercise Physiologist   Supervising physician immediately available to respond to emergencies See telemetry face sheet for immediately available ER MD   Medication changes reported     No   Fall or balance concerns reported    No   Warm-up and Cool-down Performed on first and last piece of equipment   Resistance Training Performed Yes   VAD Patient? No     Pain Assessment   Currently in Pain? No/denies   Multiple Pain Sites No           Exercise Prescription Changes - 10/21/16 1200      Response to Exercise   Blood Pressure (Admit) 134/70   Blood Pressure (Exercise) 130/68   Blood Pressure (Exit) 120/60   Heart Rate (Admit) 59 bpm   Heart Rate (Exercise) 76 bpm   Heart Rate (Exit) 54 bpm   Rating of Perceived Exertion (Exercise) 12   Symptoms none   Duration Progress to 45 minutes of aerobic exercise without signs/symptoms of physical distress   Intensity THRR unchanged     Progression   Progression Continue to progress workloads to maintain intensity without signs/symptoms of physical distress.   Average METs 3.9     Resistance Training   Training Prescription Yes   Weight 4   Reps 10-15     Interval Training   Interval Training No     Treadmill   MPH 2.8   Grade 1   Minutes 15   METs 3.53     NuStep   Level 4   Minutes 15   METs 4.3      Goals Met:  Independence with exercise equipment Exercise tolerated well No report of cardiac concerns or  symptoms Strength training completed today  Goals Unmet:  Not Applicable  Comments: Pt able to follow exercise prescription today without complaint.  Will continue to monitor for progression.    Dr. Emily Filbert is Medical Director for [facility] and [facility].

## 2016-10-25 ENCOUNTER — Encounter: Payer: Medicare Other | Admitting: *Deleted

## 2016-10-25 DIAGNOSIS — Z9889 Other specified postprocedural states: Secondary | ICD-10-CM | POA: Diagnosis not present

## 2016-10-25 DIAGNOSIS — Z48812 Encounter for surgical aftercare following surgery on the circulatory system: Secondary | ICD-10-CM | POA: Diagnosis not present

## 2016-10-25 DIAGNOSIS — Z951 Presence of aortocoronary bypass graft: Secondary | ICD-10-CM | POA: Diagnosis not present

## 2016-10-25 NOTE — Progress Notes (Signed)
Daily Session Note  Patient Details  Name: Thomas Fuentes MRN: [id number] Date of Birth: 12/25/1940 Referring Provider:   Flowsheet Row Cardiac Rehab from 09/20/2016 in [facility] Cardiac and Pulmonary Rehab  Referring Provider  Kathlyn Sacramento MD      Encounter Date: 10/25/2016  Check In:     Session Check In - 10/25/16 1635      Check-In   Location [facility]   Staff Present Gerlene Burdock, RN, BSN;Susanne Bice, RN, BSN, Laveda Norman, BS, ACSM CEP, Exercise Physiologist   Supervising physician immediately available to respond to emergencies See telemetry face sheet for immediately available ER MD   Medication changes reported     No   Fall or balance concerns reported    No   Warm-up and Cool-down Performed on first and last piece of equipment   Resistance Training Performed Yes   VAD Patient? No     Pain Assessment   Currently in Pain? No/denies   Multiple Pain Sites No         History  Smoking Status  . Former Smoker  . Years: 40.00  . Types: Cigarettes  . Quit date: 02/06/1999  Smokeless Tobacco  . Never Used    Goals Met:  Proper associated with RPD/PD & O2 Sat Exercise tolerated well  Goals Unmet:  Not Applicable  Comments:     Dr. Emily Filbert is Medical Director for [place] and [facility].

## 2016-10-27 ENCOUNTER — Encounter: Payer: Self-pay | Admitting: *Deleted

## 2016-10-27 DIAGNOSIS — Z951 Presence of aortocoronary bypass graft: Secondary | ICD-10-CM | POA: Diagnosis not present

## 2016-10-27 DIAGNOSIS — Z48812 Encounter for surgical aftercare following surgery on the circulatory system: Secondary | ICD-10-CM | POA: Diagnosis not present

## 2016-10-27 DIAGNOSIS — Z9889 Other specified postprocedural states: Secondary | ICD-10-CM | POA: Diagnosis not present

## 2016-10-27 NOTE — Progress Notes (Signed)
Daily Session Note  Patient Details  Name: Thomas Fuentes MRN: [id number] Date of Birth: Nov 19, 1940 Referring Provider:   Flowsheet Row Cardiac Rehab from 09/20/2016 in [facility] Card[facility] Rehab  Referring Provider  Kathlyn Sacramento MD      Encounter Date: 10/27/2016  Check In:     Session Check In - 10/27/16 1658      Check-In   Location [facility]   Staff Present Levell July RN BSN;Carroll Enterkin, RN, Vickki Hearing, BA, ACSM CEP, Exercise Physiologist   Supervising physician immediately available to respond to emergencies See telemetry face sheet for immediately available ER MD   Medication changes reported     No   Fall or balance concerns reported    No   Tobacco Cessation No Change   Warm-up and Cool-down Performed on first and last piece of equipment   Resistance Training Performed Yes   VAD Patient? No     Pain Assessment   Currently in Pain? No/denies   Multiple Pain Sites No         History  Smoking Status  . Former Smoker  . Years: 40.00  . Types: Cigarettes  . Quit date: 02/06/1999  Smokeless Tobacco  . Never Used    Goals Met:  Independence with exercise equipment Exercise tolerated well No report of cardiac concerns or symptoms Strength training completed today  Goals Unmet:  Not Applicable  Comments: Pt able to follow exercise prescription today without complaint.  Will continue to monitor for progression.    Dr. Emily Filbert is Medical Director for [place] and [facility].

## 2016-10-27 NOTE — Progress Notes (Signed)
Cardiac Individual Treatment Plan  Patient Details  Name: Thomas Fuentes MRN: [id number] Date of Birth: [birth year] Referring Provider:   Flowsheet Row Cardiac Rehab from 09/20/2016 in [facility] Cardiac and Pulmonary Rehab  Referring Provider  Kathlyn Sacramento MD      Initial Encounter Date:  Flowsheet Row Cardiac Rehab from 09/20/2016 in [facility] Cardiac and Pulmonary Rehab  Date  09/20/16  Referring Provider  Kathlyn Sacramento MD      Visit Diagnosis: S/P CABG x 4  Patient's Home Medications on Admission:  Current Outpatient Prescriptions:  .  amiodarone (PACERONE) 200 MG tablet, Take 1 tablet (200 mg total) by mouth daily., Disp: 30 tablet, Rfl: 1 .  aspirin EC 81 MG EC tablet, Take 1 tablet (81 mg total) by mouth daily., Disp: , Rfl:  .  ezetimibe (ZETIA) 10 MG tablet, Take 1 tablet (10 mg total) by mouth daily., Disp: 30 tablet, Rfl: 1 .  gabapentin (NEURONTIN) 600 MG tablet, Take 0.5 tablets (300 mg total) by mouth 3 (three) times daily., Disp: 45 tablet, Rfl: 0 .  levothyroxine (SYNTHROID, LEVOTHROID) 25 MCG tablet, Take 1 tablet (25 mcg total) by mouth daily before breakfast., Disp: 30 tablet, Rfl: 1 .  lisinopril (PRINIVIL,ZESTRIL) 5 MG tablet, Take 1 tablet (5 mg total) by mouth daily., Disp: 30 tablet, Rfl: 1 .  metoprolol tartrate (LOPRESSOR) 25 MG tablet, Take 0.5 tablets (12.5 mg total) by mouth 2 (two) times daily., Disp: 30 tablet, Rfl: 1  Past Medical History: Past Medical History:  Diagnosis Date  . AAA (abdominal aortic aneurysm) ([place])   . Chronic renal disease, stage III   . Coronary artery disease    a. MI 2000 s/p PCI and 2 stent placement to unknown arteries; b. Myoview negative; c. LHC 08/09/2016 o-pLAD 30%, mLAD 99%, OM2 50%, OM3 85%, pRCA 99%, mid RCA 60%, RPDA 60%, LVEDP mod elevated  . Diabetes mellitus without complication ([place])   . Dyslipidemia   . Hyperlipidemia   . Hypertension   . Hypothyroidism   . Macular degeneration    bilateral  . Metabolic syndrome    . MI (myocardial infarction)   . Peripheral neuropathy ([place])   . Peripheral vascular disease ([place])     Tobacco Use: History  Smoking Status  . Former Smoker  . Years: 40.00  . Types: Cigarettes  . Quit date: 02/06/1999  Smokeless Tobacco  . Never Used    Labs: Recent Review Flowsheet Data    Labs for ITP Cardiac and Pulmonary Rehab Latest Ref Rng & Units 08/11/2016 08/11/2016 08/11/2016 08/11/2016 08/12/2016   Cholestrol 0 - 200 mg/dL - - - - -   LDLCALC 0 - 99 mg/dL - - - - -   HDL >40 mg/dL - - - - -   Trlycerides <150 mg/dL - - - - -   Hemoglobin A1c 4.8 - 5.6 % - - - - -   PHART 7.350 - 7.450 7.381 7.347(L) - 7.369 -   PCO2ART 32.0 - 48.0 mmHg 40.4 39.5 - 40.4 -   HCO3 20.0 - 28.0 mmol/L 24.2 21.5 - 23.2 -   TCO2 0 - 100 mmol/L '25 23 23 24 24   '$ ACIDBASEDEF 0.0 - 2.0 mmol/L 1.0 4.0(H) - 2.0 -   O2SAT % 99.0 98.0 - 97.0 -       Exercise Target Goals:    Exercise Program Goal: Individual exercise prescription set with THRR, safety & activity barriers. Participant demonstrates ability to understand and report RPE using BORG  scale, to self-measure pulse accurately, and to acknowledge the importance of the exercise prescription.  Exercise Prescription Goal: Starting with aerobic activity 30 plus minutes a day, 3 days per week for initial exercise prescription. Provide home exercise prescription and guidelines that participant acknowledges understanding prior to discharge.  Activity Barriers & Risk Stratification:     Activity Barriers & Cardiac Risk Stratification - 09/20/16 1218      Activity Barriers & Cardiac Risk Stratification   Activity Barriers Other (comment);Incisional Pain   Comments Abdominal Hernia.   Wil not do sit ups.  Occasional pain from vein graft   Cardiac Risk Stratification High      6 Minute Walk:     6 Minute Walk    Row Name 09/20/16 1407         6 Minute Walk   Phase Initial     Distance 1350 feet     Walk Time 6 minutes     #  of Rest Breaks 0     MPH 2.56     METS 2.89     RPE 9     VO2 Peak 10.11     Symptoms No     Resting HR 46 bpm     Resting BP 126/64     Max Ex. HR 97 bpm     Max Ex. BP 132/70     2 Minute Post BP 130/60        Oxygen Initial Assessment:   Oxygen Re-Evaluation:   Oxygen Discharge (Final Oxygen Re-Evaluation):   Initial Exercise Prescription:     Initial Exercise Prescription - 09/20/16 1400      Date of Initial Exercise RX and Referring Provider   Date 09/20/16   Referring Provider Kathlyn Sacramento MD     Treadmill   MPH 2.5   Grade 0.5   Minutes 15   METs 3.09     NuStep   Level 2   Minutes 15   METs 2     REL-XR   Level 1   Minutes 15   METs 2     Prescription Details   Frequency (times per week) 3   Duration Progress to 45 minutes of aerobic exercise without signs/symptoms of physical distress     Intensity   THRR 40-80% of Max Heartrate 85-124   Ratings of Perceived Exertion 11-15   Perceived Dyspnea 0-4     Progression   Progression Continue to progress workloads to maintain intensity without signs/symptoms of physical distress.     Resistance Training   Training Prescription Yes   Weight 3 lbs   Reps 10-15      Perform Capillary Blood Glucose checks as needed.  Exercise Prescription Changes:     Exercise Prescription Changes    Row Name 09/20/16 1200 10/04/16 1500 10/13/16 0900 10/21/16 1200       Response to Exercise   Blood Pressure (Admit) 126/64 126/70  - 134/70    Blood Pressure (Exercise) 132/70 118/80  - 130/68    Blood Pressure (Exit) 130/60 94/52  - 120/60    Heart Rate (Admit) 46 bpm 59 bpm  - 59 bpm    Heart Rate (Exercise) 99 bpm 99 bpm  - 76 bpm    Heart Rate (Exit) 55 bpm 49 bpm  - 54 bpm    Oxygen Saturation (Admit) 99 %  -  -  -    Oxygen Saturation (Exercise) 98 %  -  -  -  Rating of Perceived Exertion (Exercise)  - 12  - 12    Perceived Dyspnea (Exercise) 9  -  -  -    Symptoms none none none none     Comments  -  - Home Exercise Guidelines given 10/13/16  -    Duration  - Progress to 45 minutes of aerobic exercise without signs/symptoms of physical distress Progress to 45 minutes of aerobic exercise without signs/symptoms of physical distress Progress to 45 minutes of aerobic exercise without signs/symptoms of physical distress    Intensity  - THRR unchanged THRR unchanged THRR unchanged      Progression   Progression  - Continue to progress workloads to maintain intensity without signs/symptoms of physical distress. Continue to progress workloads to maintain intensity without signs/symptoms of physical distress. Continue to progress workloads to maintain intensity without signs/symptoms of physical distress.    Average METs  - 3.3 3.3 3.9      Resistance Training   Training Prescription  - Yes Yes Yes    Weight  - 3 lbs 3 lbs 4    Reps  - 10-15 10-15 10-15      Interval Training   Interval Training  - No No No      Treadmill   MPH  - 2.5 2.5 2.8    Grade  - 0.5 0.5 1    Minutes  - '15 15 15    '$ METs  - 3.09 3.09 3.53      NuStep   Level  - '2 2 4    '$ Minutes  - '15 15 15    '$ METs  - 2.58 2.58 4.3      REL-XR   Level  - 1 1  -    Minutes  - 15 15  -    METs  - 4 4  -      Home Exercise Plan   Plans to continue exercise at  -  - Home  walking  -    Frequency  -  - Add 2 additional days to program exercise sessions.  -      Exercise Review   Progression -  walk test results Yes Yes  -       Exercise Comments:     Exercise Comments    Row Name 09/29/16 0900 10/04/16 1516 10/13/16 0924 10/21/16 1256     Exercise Comments First full day of exercise!  Patient was oriented to gym and equipment including functions, settings, policies, and procedures.  Patient's individual exercise prescription and treatment plan were reviewed.  All starting workloads were established based on the results of the 6 minute walk test done at initial orientation visit.  The plan for exercise  progression was also introduced and progression will be customized based on patient's performance and goals. Yahir is off to a good start with three full days.  He is already up to 4 METs on the XR!  We will continue to monitor his progression. Reviewed home exercise with pt today.  Pt plans to walk at home for exercise.  Reviewed THR, pulse, RPE, sign and symptoms, NTG use, and when to call 911 or MD.  Also discussed weather considerations and indoor options.  Pt voiced understanding. Ernie continues to progess well and has added speed and incline to TM and increased overall MET level.       Exercise Goals and Review:   Exercise Goals Re-Evaluation :   Discharge Exercise Prescription (Final Exercise Prescription  Changes):     Exercise Prescription Changes - 10/21/16 1200      Response to Exercise   Blood Pressure (Admit) 134/70   Blood Pressure (Exercise) 130/68   Blood Pressure (Exit) 120/60   Heart Rate (Admit) 59 bpm   Heart Rate (Exercise) 76 bpm   Heart Rate (Exit) 54 bpm   Rating of Perceived Exertion (Exercise) 12   Symptoms none   Duration Progress to 45 minutes of aerobic exercise without signs/symptoms of physical distress   Intensity THRR unchanged     Progression   Progression Continue to progress workloads to maintain intensity without signs/symptoms of physical distress.   Average METs 3.9     Resistance Training   Training Prescription Yes   Weight 4   Reps 10-15     Interval Training   Interval Training No     Treadmill   MPH 2.8   Grade 1   Minutes 15   METs 3.53     NuStep   Level 4   Minutes 15   METs 4.3      Nutrition:  Target Goals: Understanding of nutrition guidelines, daily intake of sodium '1500mg'$ , cholesterol '200mg'$ , calories 30% from fat and 7% or less from saturated fats, daily to have 5 or more servings of fruits and vegetables.  Biometrics:     Pre Biometrics - 09/20/16 1410      Pre Biometrics   Height 5' 7.1" (1.704 m)    Weight 142 lb 6.4 oz (64.6 kg)   Waist Circumference 35 inches   Hip Circumference 34.5 inches   Waist to Hip Ratio 1.01 %   BMI (Calculated) 22.3   Single Leg Stand 13.97 seconds       Nutrition Therapy Plan and Nutrition Goals:     Nutrition Therapy & Goals - 10/11/16 0955      Nutrition Therapy   Diet --  RD appt scheduled      Nutrition Discharge: Rate Your Plate Scores:     Nutrition Assessments - 09/20/16 1209      MEDFICTS Scores   Pre Score 16      Nutrition Goals Re-Evaluation:   Nutrition Goals Discharge (Final Nutrition Goals Re-Evaluation):   Psychosocial: Target Goals: Acknowledge presence or absence of significant depression and/or stress, maximize coping skills, provide positive support system. Participant is able to verbalize types and ability to use techniques and skills needed for reducing stress and depression.   Initial Review & Psychosocial Screening:     Initial Psych Review & Screening - 09/20/16 1216      Initial Review   Current issues with --  States NONE     Family Dynamics   Good Support System? Yes  Wife and children that live close by     Barriers   Psychosocial barriers to participate in program There are no identifiable barriers or psychosocial needs.     Screening Interventions   Interventions Encouraged to exercise      Quality of Life Scores:      Quality of Life - 09/20/16 1216      Quality of Life Scores   Health/Function Pre 21 %   Socioeconomic Pre 21 %   Psych/Spiritual Pre 21 %   Family Pre 21 %   GLOBAL Pre 21 %      PHQ-9: Recent Review Flowsheet Data    Depression screen [facility] 2/9 09/20/2016   Decreased Interest 0   Down, Depressed, Hopeless 0   PHQ - 2  Score 0   Altered sleeping 0   Tired, decreased energy 0   Change in appetite 0   Feeling bad or failure about yourself  0   Trouble concentrating 0   Moving slowly or fidgety/restless 0   Suicidal thoughts 0   PHQ-9 Score 0   Difficult  doing work/chores Not difficult at all     Interpretation of Total Score  Total Score Depression Severity:  1-4 = Minimal depression, 5-9 = Mild depression, 10-14 = Moderate depression, 15-19 = Moderately severe depression, 20-27 = Severe depression   Psychosocial Evaluation and Intervention:     Psychosocial Evaluation - 10/04/16 0922      Psychosocial Evaluation & Interventions   Interventions Encouraged to exercise with the program and follow exercise prescription;Relaxation education   Comments Counselor met with Mr. Voong today for initial psychosocial evaluation.  He is a 76 year old who had open heart surgery in December and had his first heart attack in 2000.  He has a strong support system with a son and daughter close by and a significant other who has been part of his life for the past 6 years.  Mr. Tuch is also actively involved in his local church.  He has some eye problems ans neuropathy but states both are being treated adequately.  He sleeps well and has a good appetite.  Mr. Zywicki denies a history of depression and anxiety and states he has minimal stress.  He is typically in a positive mood per his report.  He has goals to learn more about healthier lifestyle habits while in this program.  Staff will be following with Mr. Mathison throughout the course of this program.        Psychosocial Re-Evaluation:     Psychosocial Re-Evaluation    [place] Name 10/25/16 1737             Psychosocial Re-Evaluation   Comments Counselor follow up with Fritz Pickerel reporting feeling stronger and enjoying this class more (changed from morning to afternoon class).  He plans to meet with the dietician later this week to help with meal planning since he put himself "on a strict diet" and wants suggestions on what she should and should not eat at this point.  Abdias states he continues to sleep well.  Counselor commended Garrie for his progress and his commitment to improved health.          Expected Outcomes Basil will meet with the dietician to determine the best meal plan options for him at this time, and will begin to follow this.  Morrison will continue to exercise consistently to regain his strength and stamina.         Continue Psychosocial Services  Follow up required by staff          Psychosocial Discharge (Final Psychosocial Re-Evaluation):     Psychosocial Re-Evaluation - 10/25/16 1737      Psychosocial Re-Evaluation   Comments Counselor follow up with Fritz Pickerel reporting feeling stronger and enjoying this class more (changed from morning to afternoon class).  He plans to meet with the dietician later this week to help with meal planning since he put himself "on a strict diet" and wants suggestions on what she should and should not eat at this point.  Emmons states he continues to sleep well.  Counselor commended Ousman for his progress and his commitment to improved health.     Expected Outcomes Jerrold will meet with the dietician to determine the best meal  plan options for him at this time, and will begin to follow this.  Clennon will continue to exercise consistently to regain his strength and stamina.     Continue Psychosocial Services  Follow up required by staff      Vocational Rehabilitation: Provide vocational rehab assistance to qualifying candidates.   Vocational Rehab Evaluation & Intervention:     Vocational Rehab - 09/20/16 1219      Initial Vocational Rehab Evaluation & Intervention   Assessment shows need for Vocational Rehabilitation No      Education: Education Goals: Education classes will be provided on a weekly basis, covering required topics. Participant will state understanding/return demonstration of topics presented.  Learning Barriers/Preferences:     Learning Barriers/Preferences - 09/20/16 1218      Learning Barriers/Preferences   Learning Barriers None   Learning Preferences None      Education Topics: General Nutrition  Guidelines/Fats and Fiber: -Group instruction provided by verbal, written material, models and posters to present the general guidelines for heart healthy nutrition. Gives an explanation and review of dietary fats and fiber.   Controlling Sodium/Reading Food Labels: -Group verbal and written material supporting the discussion of sodium use in heart healthy nutrition. Review and explanation with models, verbal and written materials for utilization of the food label.   Exercise Physiology & Risk Factors: - Group verbal and written instruction with models to review the exercise physiology of the cardiovascular system and associated critical values. Details cardiovascular disease risk factors and the goals associated with each risk factor.   Aerobic Exercise & Resistance Training: - Gives group verbal and written discussion on the health impact of inactivity. On the components of aerobic and resistive training programs and the benefits of this training and how to safely progress through these programs.   Flexibility, Balance, General Exercise Guidelines: - Provides group verbal and written instruction on the benefits of flexibility and balance training programs. Provides general exercise guidelines with specific guidelines to those with heart or lung disease. Demonstration and skill practice provided.   Stress Management: - Provides group verbal and written instruction about the health risks of elevated stress, cause of high stress, and healthy ways to reduce stress. Flowsheet Row Cardiac Rehab from 10/25/2016 in [facility] Cardiac and Pulmonary Rehab  Date  10/06/16  Educator  [facility]  Instruction Review Code  2- meets goals/outcomes      Depression: - Provides group verbal and written instruction on the correlation between heart/lung disease and depressed mood, treatment options, and the stigmas associated with seeking treatment.   Anatomy & Physiology of the Heart: - Group verbal and written  instruction and models provide basic cardiac anatomy and physiology, with the coronary electrical and arterial systems. Review of: AMI, Angina, Valve disease, Heart Failure, Cardiac Arrhythmia, Pacemakers, and the ICD. Flowsheet Row Cardiac Rehab from 10/25/2016 in [facility] Cardiac and Pulmonary Rehab  Date  10/04/16  Educator  MA  Instruction Review Code  2- meets goals/outcomes      Cardiac Procedures: - Group verbal and written instruction and models to describe the testing methods done to diagnose heart disease. Reviews the outcomes of the test results. Describes the treatment choices: Medical Management, Angioplasty, or Coronary Bypass Surgery. Flowsheet Row Cardiac Rehab from 10/25/2016 in [facility] Cardiac and Pulmonary Rehab  Date  10/11/16  Educator  CE  Instruction Review Code  2- meets goals/outcomes      Cardiac Medications: - Group verbal and written instruction to review commonly prescribed medications for heart  disease. Reviews the medication, class of the drug, and side effects. Includes the steps to properly store meds and maintain the prescription regimen. Flowsheet Row Cardiac Rehab from 10/25/2016 in [facility] Cardiac and Pulmonary Rehab  Date  10/20/16  Educator  CE  Instruction Review Code  2- meets goals/outcomes      Go Sex-Intimacy & Heart Disease, Get SMART - Goal Setting: - Group verbal and written instruction through game format to discuss heart disease and the return to sexual intimacy. Provides group verbal and written material to discuss and apply goal setting through the application of the S.M.A.R.T. Method. Flowsheet Row Cardiac Rehab from 10/25/2016 in [facility] Cardiac and Pulmonary Rehab  Date  10/11/16  Educator  CE  Instruction Review Code  2- meets goals/outcomes      Other Matters of the Heart: - Provides group verbal, written materials and models to describe Heart Failure, Angina, Valve Disease, and Diabetes in the realm of heart disease. Includes description of  the disease process and treatment options available to the cardiac patient. Flowsheet Row Cardiac Rehab from 10/25/2016 in [facility] Cardiac and Pulmonary Rehab  Date  10/04/16  Educator  MA  Instruction Review Code  2- meets goals/outcomes      Exercise & Equipment Safety: - Individual verbal instruction and demonstration of equipment use and safety with use of the equipment. Flowsheet Row Cardiac Rehab from 10/25/2016 in [facility] Cardiac and Pulmonary Rehab  Date  09/20/16  Educator  CE  Instruction Review Code  2- meets goals/outcomes      Infection Prevention: - Provides verbal and written material to individual with discussion of infection control including proper hand washing and proper equipment cleaning during exercise session. Flowsheet Row Cardiac Rehab from 10/25/2016 in [facility] Cardiac and Pulmonary Rehab  Date  09/20/16  Educator  CE  Instruction Review Code  2- meets goals/outcomes      Falls Prevention: - Provides verbal and written material to individual with discussion of falls prevention and safety. Flowsheet Row Cardiac Rehab from 10/25/2016 in [facility] Cardiac and Pulmonary Rehab  Date  09/20/16  Educator  CE  Instruction Review Code  2- meets goals/outcomes      Diabetes: - Individual verbal and written instruction to review signs/symptoms of diabetes, desired ranges of glucose level fasting, after meals and with exercise. Advice that pre and post exercise glucose checks will be done for 3 sessions at entry of program.    Knowledge Questionnaire Score:     Knowledge Questionnaire Score - 09/20/16 1218      Knowledge Questionnaire Score   Pre Score 22/28      Core Components/Risk Factors/Patient Goals at Admission:     Personal Goals and Risk Factors at Admission - 09/20/16 1212      Core Components/Risk Factors/Patient Goals on Admission    Weight Management Yes;Weight Maintenance   Intervention Weight Management: Develop a combined nutrition and exercise  program designed to reach desired caloric intake, while maintaining appropriate intake of nutrient and fiber, sodium and fats, and appropriate energy expenditure required for the weight goal.;Weight Management: Provide education and appropriate resources to help participant work on and attain dietary goals.   Admit Weight 142 lb 6.4 oz (64.6 kg)   Expected Outcomes Weight Maintenance: Understanding of the daily nutrition guidelines, which includes 25-35% calories from fat, 7% or less cal from saturated fats, less than '200mg'$  cholesterol, less than 1.5gm of sodium, & 5 or more servings of fruits and vegetables daily   Sedentary Yes  Intervention Provide advice, education, support and counseling about physical activity/exercise needs.;Develop an individualized exercise prescription for aerobic and resistive training based on initial evaluation findings, risk stratification, comorbidities and participant's personal goals.   Expected Outcomes Achievement of increased cardiorespiratory fitness and enhanced flexibility, muscular endurance and strength shown through measurements of functional capacity and personal statement of participant.   Increase Strength and Stamina Yes   Intervention Provide advice, education, support and counseling about physical activity/exercise needs.;Develop an individualized exercise prescription for aerobic and resistive training based on initial evaluation findings, risk stratification, comorbidities and participant's personal goals.   Expected Outcomes Achievement of increased cardiorespiratory fitness and enhanced flexibility, muscular endurance and strength shown through measurements of functional capacity and personal statement of participant.   Hypertension Yes   Intervention Provide education on lifestyle modifcations including regular physical activity/exercise, weight management, moderate sodium restriction and increased consumption of fresh fruit, vegetables, and low fat  dairy, alcohol moderation, and smoking cessation.;Monitor prescription use compliance.   Expected Outcomes Short Term: Continued assessment and intervention until BP is < 140/27m HG in hypertensive participants. < 130/834mHG in hypertensive participants with diabetes, heart failure or chronic kidney disease.;Long Term: Maintenance of blood pressure at goal levels.   Lipids Yes  Cannot tolerate Statins. HAs drasctically changed eating habits to help control Cholesterol Risk Factor   Intervention Provide education and support for participant on nutrition & aerobic/resistive exercise along with prescribed medications to achieve LDL '70mg'$ , HDL >'40mg'$ .   Expected Outcomes Short Term: Participant states understanding of desired cholesterol values and is compliant with medications prescribed. Participant is following exercise prescription and nutrition guidelines.;Long Term: Cholesterol controlled with medications as prescribed, with individualized exercise RX and with personalized nutrition plan. Value goals: LDL < '70mg'$ , HDL > 40 mg.      Core Components/Risk Factors/Patient Goals Review:      Goals and Risk Factor Review    Row Name 10/21/16 1710             Core Components/Risk Factors/Patient Goals Review   Personal Goals Review Hypertension;Lipids;Increase Strength and Stamina       Review Larrys blood pressures have been within normal limits.  He started back to work this week and ws tired first day but second day felt ok.  He will have cholesterol checked in March.          Core Components/Risk Factors/Patient Goals at Discharge (Final Review):      Goals and Risk Factor Review - 10/21/16 1710      Core Components/Risk Factors/Patient Goals Review   Personal Goals Review Hypertension;Lipids;Increase Strength and Stamina   Review Larrys blood pressures have been within normal limits.  He started back to work this week and ws tired first day but second day felt ok.  He will have  cholesterol checked in March.      ITP Comments:     ITP Comments    Row Name 09/20/16 1207 09/29/16 0558 10/27/16 0624       ITP Comments Medical REview completed.  Initial ITP created. Documnetation of [facility]. Continue with ITP unless directed changes per Medical Director review.  Has not attended session since Medical REview 30 day review. Continue with ITP unless directed changes per Medical Director review        Comments:

## 2016-10-28 ENCOUNTER — Encounter: Payer: Self-pay | Admitting: Dietician

## 2016-10-28 ENCOUNTER — Encounter: Payer: Medicare Other | Attending: Cardiovascular Disease

## 2016-10-28 DIAGNOSIS — Z48812 Encounter for surgical aftercare following surgery on the circulatory system: Secondary | ICD-10-CM | POA: Insufficient documentation

## 2016-10-28 DIAGNOSIS — Z951 Presence of aortocoronary bypass graft: Secondary | ICD-10-CM | POA: Insufficient documentation

## 2016-10-28 DIAGNOSIS — Z9889 Other specified postprocedural states: Secondary | ICD-10-CM | POA: Insufficient documentation

## 2016-11-01 ENCOUNTER — Encounter: Payer: Medicare Other | Admitting: *Deleted

## 2016-11-01 DIAGNOSIS — Z9889 Other specified postprocedural states: Secondary | ICD-10-CM | POA: Diagnosis not present

## 2016-11-01 DIAGNOSIS — Z951 Presence of aortocoronary bypass graft: Secondary | ICD-10-CM | POA: Diagnosis not present

## 2016-11-01 DIAGNOSIS — Z48812 Encounter for surgical aftercare following surgery on the circulatory system: Secondary | ICD-10-CM | POA: Diagnosis not present

## 2016-11-01 NOTE — Progress Notes (Signed)
Daily Session Note  Patient Details  Name: Thomas Fuentes MRN: [id number] Date of Birth: [birth year] Referring Provider:   Flowsheet Row Cardiac Rehab from 09/20/2016 in [facility]  Referring Provider  Kathlyn Sacramento MD      Encounter Date: 11/01/2016  Check In:     Session Check In - 11/01/16 1629      Check-In   Location [facility]iac & Pul[facility] Re[facility]   Staff Present Earlean Shawl, BS, ACSM CEP, Exercise Physiologist;Eniya Cannady Brayton El, DPT, CEEA;Heath Lark, RN, BSN, CCRP   Supervising physician immediately available to respond to emergencies See telemetry face sheet for immediately available ER MD   Medication changes reported     No   Fall or balance concerns reported    No   Warm-up and Cool-down Performed on first and last piece of equipment   Resistance Training Performed Yes   VAD Patient? No     Pain Assessment   Currently in Pain? No/denies   Multiple Pain Sites No         History  Smoking Status  . Former Smoker  . Years: 40.00  . Types: Cigarettes  . Quit date: 02/06/1999  Smokeless Tobacco  . Never Used    Goals Met:  Independence with exercise equipment  Goals Unmet:  Not Applicable  Comments: Patient completed exercise prescription and all exercise goals during rehab session. The exercise was tolerated well and the patient is progressing in the program.    Dr. Emily Filbert is Medical Director for [place] and [facility].

## 2016-11-03 ENCOUNTER — Other Ambulatory Visit: Payer: Self-pay | Admitting: Physician Assistant

## 2016-11-03 DIAGNOSIS — Z9889 Other specified postprocedural states: Secondary | ICD-10-CM | POA: Diagnosis not present

## 2016-11-03 DIAGNOSIS — Z951 Presence of aortocoronary bypass graft: Secondary | ICD-10-CM | POA: Diagnosis not present

## 2016-11-03 DIAGNOSIS — Z48812 Encounter for surgical aftercare following surgery on the circulatory system: Secondary | ICD-10-CM | POA: Diagnosis not present

## 2016-11-04 DIAGNOSIS — Z48812 Encounter for surgical aftercare following surgery on the circulatory system: Secondary | ICD-10-CM | POA: Diagnosis not present

## 2016-11-04 DIAGNOSIS — Z9889 Other specified postprocedural states: Secondary | ICD-10-CM | POA: Diagnosis not present

## 2016-11-04 DIAGNOSIS — Z951 Presence of aortocoronary bypass graft: Secondary | ICD-10-CM | POA: Diagnosis not present

## 2016-11-04 NOTE — Progress Notes (Signed)
Daily Session Note  Patient Details  Name: Thomas Fuentes MRN: [id number] Date of Birth: 06/08/1941 Referring Provider:   Flowsheet Row Cardiac Rehab from 09/20/2016 in [facility]  Referring Provider  Kathlyn Sacramento MD      Encounter Date: 11/04/2016  Check In:     Session Check In - 11/04/16 1640      Check-In   Location [facility]   Staff Present Levell July RN BSN;Destinie Thornsberry, DPT, Burlene Arnt, BA, ACSM CEP, Exercise Physiologist   Supervising physician immediately available to respond to emergencies See telemetry face sheet for immediately available ER MD   Medication changes reported     No   Fall or balance concerns reported    No   Tobacco Cessation No Change   Warm-up and Cool-down Performed on first and last piece of equipment   Resistance Training Performed Yes   VAD Patient? No     Pain Assessment   Currently in Pain? No/denies   Multiple Pain Sites No           Exercise Prescription Changes - 11/04/16 1100      Response to Exercise   Blood Pressure (Admit) 120/66   Blood Pressure (Exercise) 132/66   Blood Pressure (Exit) 132/66   Heart Rate (Admit) 44 bpm   Heart Rate (Exercise) 90 bpm   Heart Rate (Exit) 51 bpm   Rating of Perceived Exertion (Exercise) 12   Duration Progress to 45 minutes of aerobic exercise without signs/symptoms of physical distress   Intensity THRR unchanged     Progression   Progression Continue to progress workloads to maintain intensity without signs/symptoms of physical distress.   Average METs 4.1     Resistance Training   Training Prescription Yes   Weight 4   Reps 10-15     Interval Training   Interval Training No     Treadmill   MPH 2.8   Grade 1   Minutes 15   METs 3.53     NuStep   Level 4   Minutes 15   METs 4.1      History  Smoking Status  . Former Smoker  . Years: 40.00  . Types: Cigarettes  . Quit date: 02/06/1999  Smokeless Tobacco  .  Never Used    Goals Met:  Independence with exercise equipment  Goals Unmet:  Not Applicable  Comments: Patient completed exercise prescription and all exercise goals during rehab session. The exercise was tolerated well and the patient is progressing in the program.    Dr. Emily Filbert is Medical Director for [place] and [facility].

## 2016-11-04 NOTE — Progress Notes (Signed)
Daily Session Note  Patient Details  Name: Thomas Fuentes MRN: [id number] Date of Birth: 04-Jun-1941 Referring Provider:   Flowsheet Row Cardiac Rehab from 09/20/2016 in [facility]  Referring Provider  Kathlyn Sacramento MD      Encounter Date: 11/03/2016  Check In:     Session Check In - 11/03/16 1638      Check-In   Location [facility]   Staff Present Levell July RN BSN;Rebecca Sickles, DPT, Burlene Arnt, BA, ACSM CEP, Exercise Physiologist   Supervising physician immediately available to respond to emergencies See telemetry face sheet for immediately available ER MD   Medication changes reported     No   Fall or balance concerns reported    No   Tobacco Cessation No Change   Warm-up and Cool-down Performed on first and last piece of equipment   Resistance Training Performed Yes   VAD Patient? No     Pain Assessment   Currently in Pain? No/denies   Multiple Pain Sites No           Exercise Prescription Changes - 11/04/16 1100      Response to Exercise   Blood Pressure (Admit) 120/66   Blood Pressure (Exercise) 132/66   Blood Pressure (Exit) 132/66   Heart Rate (Admit) 44 bpm   Heart Rate (Exercise) 90 bpm   Heart Rate (Exit) 51 bpm   Rating of Perceived Exertion (Exercise) 12   Duration Progress to 45 minutes of aerobic exercise without signs/symptoms of physical distress   Intensity THRR unchanged     Progression   Progression Continue to progress workloads to maintain intensity without signs/symptoms of physical distress.   Average METs 4.1     Resistance Training   Training Prescription Yes   Weight 4   Reps 10-15     Interval Training   Interval Training No     Treadmill   MPH 2.8   Grade 1   Minutes 15   METs 3.53     NuStep   Level 4   Minutes 15   METs 4.1      History  Smoking Status  . Former Smoker  . Years: 40.00  . Types: Cigarettes  . Quit date: 02/06/1999  Smokeless Tobacco  .  Never Used    Goals Met:  Independence with exercise equipment Exercise tolerated well No report of cardiac concerns or symptoms Strength training completed today  Goals Unmet:  Not Applicable  Comments: Pt able to follow exercise prescription today without complaint.  Will continue to monitor for progression.    Dr. Emily Filbert is Medical Director for [place] and [facility].

## 2016-11-10 ENCOUNTER — Encounter: Payer: Medicare Other | Admitting: *Deleted

## 2016-11-10 DIAGNOSIS — Z9889 Other specified postprocedural states: Secondary | ICD-10-CM | POA: Diagnosis not present

## 2016-11-10 DIAGNOSIS — Z48812 Encounter for surgical aftercare following surgery on the circulatory system: Secondary | ICD-10-CM | POA: Diagnosis not present

## 2016-11-10 DIAGNOSIS — Z951 Presence of aortocoronary bypass graft: Secondary | ICD-10-CM | POA: Diagnosis not present

## 2016-11-10 NOTE — Progress Notes (Signed)
Daily Session Note  Patient Details  Name: Thomas Fuentes MRN: [id number] Date of Birth: [birth year] Referring Provider:   Flowsheet Row Cardiac Rehab from 09/20/2016 in [facility]  Referring Provider  Kathlyn Sacramento MD      Encounter Date: 11/10/2016  Check In:      History  Smoking Status  . Former Smoker  . Years: 40.00  . Types: Cigarettes  . Quit date: 02/06/1999  Smokeless Tobacco  . Never Used    Goals Met:  Proper associated with RPD/PD & O2 Sat Exercise tolerated well Personal goals reviewed  Goals Unmet:  Not Applicable  Comments:     Dr. Emily Filbert is Medical Director for [place] and [facility].

## 2016-11-10 NOTE — Progress Notes (Signed)
Cardiac Individual Treatment Plan  Patient Details  Name: Thomas Fuentes MRN: [id number] Date of Birth: 1941-02-28 Referring Provider:   Flowsheet Row Cardiac Rehab from 09/20/2016 in [facility] Cardiac and Pulmonary Rehab  Referring Provider  Kathlyn Sacramento MD      Initial Encounter Date:  Flowsheet Row Cardiac Rehab from 09/20/2016 in [facility] Cardiac and Pulmonary Rehab  Date  09/20/16  Referring Provider  Kathlyn Sacramento MD      Visit Diagnosis: No diagnosis found.  Patient's Home Medications on Admission:  Current Outpatient Prescriptions:  .  amiodarone (PACERONE) 200 MG tablet, Take 1 tablet (200 mg total) by mouth daily., Disp: 30 tablet, Rfl: 1 .  aspirin EC 81 MG EC tablet, Take 1 tablet (81 mg total) by mouth daily., Disp: , Rfl:  .  ezetimibe (ZETIA) 10 MG tablet, Take 1 tablet (10 mg total) by mouth daily., Disp: 30 tablet, Rfl: 1 .  gabapentin (NEURONTIN) 600 MG tablet, Take 0.5 tablets (300 mg total) by mouth 3 (three) times daily., Disp: 45 tablet, Rfl: 0 .  levothyroxine (SYNTHROID, LEVOTHROID) 25 MCG tablet, Take 1 tablet (25 mcg total) by mouth daily before breakfast., Disp: 30 tablet, Rfl: 1 .  lisinopril (PRINIVIL,ZESTRIL) 5 MG tablet, Take 1 tablet (5 mg total) by mouth daily., Disp: 30 tablet, Rfl: 1 .  metoprolol tartrate (LOPRESSOR) 25 MG tablet, Take 0.5 tablets (12.5 mg total) by mouth 2 (two) times daily., Disp: 30 tablet, Rfl: 1  Past Medical History: Past Medical History:  Diagnosis Date  . AAA (abdominal aortic aneurysm) ([place])   . Chronic renal disease, stage III   . Coronary artery disease    a. MI 2000 s/p PCI and 2 stent placement to unknown arteries; b. Myoview negative; c. LHC 08/09/2016 o-pLAD 30%, mLAD 99%, OM2 50%, OM3 85%, pRCA 99%, mid RCA 60%, RPDA 60%, LVEDP mod elevated  . Diabetes mellitus without complication ([place])   . Dyslipidemia   . Hyperlipidemia   . Hypertension   . Hypothyroidism   . Macular degeneration    bilateral  . Metabolic  syndrome   . MI (myocardial infarction)   . Peripheral neuropathy ([place])   . Peripheral vascular disease ([place])     Tobacco Use: History  Smoking Status  . Former Smoker  . Years: 40.00  . Types: Cigarettes  . Quit date: 02/06/1999  Smokeless Tobacco  . Never Used    Labs: Recent Review Flowsheet Data    Labs for ITP Cardiac and Pulmonary Rehab Latest Ref Rng & Units 08/11/2016 08/11/2016 08/11/2016 08/11/2016 08/12/2016   Cholestrol 0 - 200 mg/dL - - - - -   LDLCALC 0 - 99 mg/dL - - - - -   HDL >40 mg/dL - - - - -   Trlycerides <150 mg/dL - - - - -   Hemoglobin A1c 4.8 - 5.6 % - - - - -   PHART 7.350 - 7.450 7.381 7.347(L) - 7.369 -   PCO2ART 32.0 - 48.0 mmHg 40.4 39.5 - 40.4 -   HCO3 20.0 - 28.0 mmol/L 24.2 21.5 - 23.2 -   TCO2 0 - 100 mmol/L _0 ACIDBASEDEF 0.0 - 2.0 mmol/L 1.0 4.0(H) - 2.0 -   O2SAT % 99.0 98.0 - 97.0 -       Exercise Target Goals:    Exercise Program Goal: Individual exercise prescription set with THRR, safety & activity barriers. Participant demonstrates ability to understand and report RPE using BORG scale,  to self-measure pulse accurately, and to acknowledge the importance of the exercise prescription.  Exercise Prescription Goal: Starting with aerobic activity 30 plus minutes a day, 3 days per week for initial exercise prescription. Provide home exercise prescription and guidelines that participant acknowledges understanding prior to discharge.  Activity Barriers & Risk Stratification:     Activity Barriers & Cardiac Risk Stratification - 09/20/16 1218      Activity Barriers & Cardiac Risk Stratification   Activity Barriers Other (comment);Incisional Pain   Comments Abdominal Hernia.   Wil not do sit ups.  Occasional pain from vein graft   Cardiac Risk Stratification High      6 Minute Walk:     6 Minute Walk    Row Name 09/20/16 1407         6 Minute Walk   Phase Initial     Distance 1350 feet     Walk Time 6  minutes     # of Rest Breaks 0     MPH 2.56     METS 2.89     RPE 9     VO2 Peak 10.11     Symptoms No     Resting HR 46 bpm     Resting BP 126/64     Max Ex. HR 97 bpm     Max Ex. BP 132/70     2 Minute Post BP 130/60        Oxygen Initial Assessment:   Oxygen Re-Evaluation:   Oxygen Discharge (Final Oxygen Re-Evaluation):   Initial Exercise Prescription:     Initial Exercise Prescription - 09/20/16 1400      Date of Initial Exercise RX and Referring Provider   Date 09/20/16   Referring Provider Kathlyn Sacramento MD     Treadmill   MPH 2.5   Grade 0.5   Minutes 15   METs 3.09     NuStep   Level 2   Minutes 15   METs 2     REL-XR   Level 1   Minutes 15   METs 2     Prescription Details   Frequency (times per week) 3   Duration Progress to 45 minutes of aerobic exercise without signs/symptoms of physical distress     Intensity   THRR 40-80% of Max Heartrate 85-124   Ratings of Perceived Exertion 11-15   Perceived Dyspnea 0-4     Progression   Progression Continue to progress workloads to maintain intensity without signs/symptoms of physical distress.     Resistance Training   Training Prescription Yes   Weight 3 lbs   Reps 10-15      Perform Capillary Blood Glucose checks as needed.  Exercise Prescription Changes:     Exercise Prescription Changes    Row Name 09/20/16 1200 10/04/16 1500 10/13/16 0900 10/21/16 1200 11/04/16 1100     Response to Exercise   Blood Pressure (Admit) 126/64 126/70  - 134/70 120/66   Blood Pressure (Exercise) 132/70 118/80  - 130/68 132/66   Blood Pressure (Exit) 130/60 94/52  - 120/60 132/66   Heart Rate (Admit) 46 bpm 59 bpm  - 59 bpm 44 bpm   Heart Rate (Exercise) 99 bpm 99 bpm  - 76 bpm 90 bpm   Heart Rate (Exit) 55 bpm 49 bpm  - 54 bpm 51 bpm   Oxygen Saturation (Admit) 99 %  -  -  -  -   Oxygen Saturation (Exercise) 98 %  -  -  -  -  Rating of Perceived Exertion (Exercise)  - 12  - 12 12   Perceived  Dyspnea (Exercise) 9  -  -  -  -   Symptoms none none none none  -   Comments  -  - Home Exercise Guidelines given 10/13/16  -  -   Duration  - Progress to 45 minutes of aerobic exercise without signs/symptoms of physical distress Progress to 45 minutes of aerobic exercise without signs/symptoms of physical distress Progress to 45 minutes of aerobic exercise without signs/symptoms of physical distress Progress to 45 minutes of aerobic exercise without signs/symptoms of physical distress   Intensity  - THRR unchanged THRR unchanged THRR unchanged THRR unchanged     Progression   Progression  - Continue to progress workloads to maintain intensity without signs/symptoms of physical distress. Continue to progress workloads to maintain intensity without signs/symptoms of physical distress. Continue to progress workloads to maintain intensity without signs/symptoms of physical distress. Continue to progress workloads to maintain intensity without signs/symptoms of physical distress.   Average METs  - 3.3 3.3 3.9 4.1     Resistance Training   Training Prescription  - Yes Yes Yes Yes   Weight  - 3 lbs 3 lbs 4 4   Reps  - 10-15 10-15 10-15 10-15     Interval Training   Interval Training  - No No No No     Treadmill   MPH  - 2.5 2.5 2.8 2.8   Grade  - 0.5 0._0 Minutes  - _1 METs  - 3.09 3.09 3.53 3.53     NuStep   Level  - _2 Minutes  - _3 METs  - 2.58 2.58 4.3 4.1     REL-XR   Level  - 1 1  -  -   Minutes  - 15 15  -  -   METs  - 4 4  -  -     Home Exercise Plan   Plans to continue exercise at  -  - Home  walking  -  -   Frequency  -  - Add 2 additional days to program exercise sessions.  -  -     Exercise Review   Progression -  walk test results Yes Yes  -  -      Exercise Comments:     Exercise Comments    Row Name 09/29/16 0900 10/04/16 1516 10/13/16 0924 10/21/16 1256 11/04/16 1135   Exercise Comments First full day of exercise!  Patient  was oriented to gym and equipment including functions, settings, policies, and procedures.  Patient's individual exercise prescription and treatment plan were reviewed.  All starting workloads were established based on the results of the 6 minute walk test done at initial orientation visit.  The plan for exercise progression was also introduced and progression will be customized based on patient's performance and goals. Dennie is off to a good start with three full days.  He is already up to 4 METs on the XR!  We will continue to monitor his progression. Reviewed home exercise with pt today.  Pt plans to walk at home for exercise.  Reviewed THR, pulse, RPE, sign and symptoms, NTG use, and when to call 911 or MD.  Also discussed weather considerations and indoor options.  Pt voiced understanding. Laverne continues to progess well and has added speed and  incline to TM and increased overall MET level. [place] continues to tolerate exercise well.      Exercise Goals and Review:   Exercise Goals Re-Evaluation :   Discharge Exercise Prescription (Final Exercise Prescription Changes):     Exercise Prescription Changes - 11/04/16 1100      Response to Exercise   Blood Pressure (Admit) 120/66   Blood Pressure (Exercise) 132/66   Blood Pressure (Exit) 132/66   Heart Rate (Admit) 44 bpm   Heart Rate (Exercise) 90 bpm   Heart Rate (Exit) 51 bpm   Rating of Perceived Exertion (Exercise) 12   Duration Progress to 45 minutes of aerobic exercise without signs/symptoms of physical distress   Intensity THRR unchanged     Progression   Progression Continue to progress workloads to maintain intensity without signs/symptoms of physical distress.   Average METs 4.1     Resistance Training   Training Prescription Yes   Weight 4   Reps 10-15     Interval Training   Interval Training No     Treadmill   MPH 2.8   Grade 1   Minutes 15   METs 3.53     NuStep   Level 4   Minutes 15   METs 4.1       Nutrition:  Target Goals: Understanding of nutrition guidelines, daily intake of sodium <1514m, cholesterol <2011m calories 30% from fat and 7% or less from saturated fats, daily to have 5 or more servings of fruits and vegetables.  Biometrics:     Pre Biometrics - 09/20/16 1410      Pre Biometrics   Height 5' 7.1" (1.704 m)   Weight 142 lb 6.4 oz (64.6 kg)   Waist Circumference 35 inches   Hip Circumference 34.5 inches   Waist to Hip Ratio 1.01 %   BMI (Calculated) 22.3   Single Leg Stand 13.97 seconds       Nutrition Therapy Plan and Nutrition Goals:     Nutrition Therapy & Goals - 10/28/16 1823      Nutrition Therapy   Diet DASH basic guidelines   Fruits and Vegetables 8 servings/day     Personal Nutrition Goals   Nutrition Goal Include protein with fruit at lunch, such as nuts or peanut butter. When eating a vegetable plate, include beans as one of your veggies as a protein source.   Personal Goal #2 Keep up your healthy choices and eating pattern, great job!   Comments Patient is currently following a very low fat eating pattern in effort to lower his cholesterol. He will add some healthy fats after receiving next lab results. He is choosing heart healthy foods and controlling weight.      Intervention Plan   Intervention Prescribe, educate and counsel regarding individualized specific dietary modifications aiming towards targeted core components such as weight, hypertension, lipid management, diabetes, heart failure and other comorbidities.;Nutrition handout(s) given to patient.   Expected Outcomes Short Term Goal: Understand basic principles of dietary content, such as calories, fat, sodium, cholesterol and nutrients.;Short Term Goal: A plan has been developed with personal nutrition goals set during dietitian appointment.;Long Term Goal: Adherence to prescribed nutrition plan.      Nutrition Discharge: Rate Your Plate Scores:     Nutrition Assessments -  09/20/16 1209      MEDFICTS Scores   Pre Score 16      Nutrition Goals Re-Evaluation:     Nutrition Goals Re-Evaluation    [place] 11/10/16 1620  Goals   Comment Govind has increased his protein per the dietician suggestions       Expected Outcome Cont heart healthy lifestyle.          Nutrition Goals Discharge (Final Nutrition Goals Re-Evaluation):     Nutrition Goals Re-Evaluation - 11/10/16 1620      Goals   Comment Dai has increased his protein per the dietician suggestions   Expected Outcome Cont heart healthy lifestyle.      Psychosocial: Target Goals: Acknowledge presence or absence of significant depression and/or stress, maximize coping skills, provide positive support system. Participant is able to verbalize types and ability to use techniques and skills needed for reducing stress and depression.   Initial Review & Psychosocial Screening:     Initial Psych Review & Screening - 09/20/16 1216      Initial Review   Current issues with --  States NONE     Family Dynamics   Good Support System? Yes  Wife and children that live close by     Barriers   Psychosocial barriers to participate in program There are no identifiable barriers or psychosocial needs.     Screening Interventions   Interventions Encouraged to exercise      Quality of Life Scores:      Quality of Life - 09/20/16 1216      Quality of Life Scores   Health/Function Pre 21 %   Socioeconomic Pre 21 %   Psych/Spiritual Pre 21 %   Family Pre 21 %   GLOBAL Pre 21 %      PHQ-9: Recent Review Flowsheet Data    Depression screen [place] 2/9 09/20/2016   Decreased Interest 0   Down, Depressed, Hopeless 0   PHQ - 2 Score 0   Altered sleeping 0   Tired, decreased energy 0   Change in appetite 0   Feeling bad or failure about yourself  0   Trouble concentrating 0   Moving slowly or fidgety/restless 0   Suicidal thoughts 0   PHQ-9 Score 0   Difficult doing  work/chores Not difficult at all     Interpretation of Total Score  Total Score Depression Severity:  1-4 = Minimal depression, 5-9 = Mild depression, 10-14 = Moderate depression, 15-19 = Moderately severe depression, 20-27 = Severe depression   Psychosocial Evaluation and Intervention:     Psychosocial Evaluation - 10/04/16 0922      Psychosocial Evaluation & Interventions   Interventions Encouraged to exercise with the program and follow exercise prescription;Relaxation education   Comments Counselor met with Mr. Marasigan today for initial psychosocial evaluation.  He is a 76 year old who had open heart surgery in December and had his first heart attack in 2000.  He has a strong support system with a son and daughter close by and a significant other who has been part of his life for the past 6 years.  Mr. Pauwels is also actively involved in his local church.  He has some eye problems ans neuropathy but states both are being treated adequately.  He sleeps well and has a good appetite.  Mr. Kotarski denies a history of depression and anxiety and states he has minimal stress.  He is typically in a positive mood per his report.  He has goals to learn more about healthier lifestyle habits while in this program.  Staff will be following with Mr. Hayhurst throughout the course of this program.        Psychosocial Re-Evaluation:  Psychosocial Re-Evaluation    Row Name 10/25/16 1737 11/10/16 1619           Psychosocial Re-Evaluation   Comments Counselor follow up with Fritz Pickerel reporting feeling stronger and enjoying this class more (changed from morning to afternoon class).  He plans to meet with the dietician later this week to help with meal planning since he put himself "on a strict diet" and wants suggestions on what she should and should not eat at this point.  Quinton states he continues to sleep well.  Counselor commended Jawara for his progress and his commitment to improved health.    -       Expected Outcomes Fue will meet with the dietician to determine the best meal plan options for him at this time, and will begin to follow this.  Barton will continue to exercise consistently to regain his strength and stamina.   Works in am so doing pm class. Korion feels he is doing well.       Continue Psychosocial Services  Follow up required by staff  -         Psychosocial Discharge (Final Psychosocial Re-Evaluation):     Psychosocial Re-Evaluation - 11/10/16 1619      Psychosocial Re-Evaluation   Expected Outcomes Works in am so doing pm class. Donzell feels he is doing well.       Vocational Rehabilitation: Provide vocational rehab assistance to qualifying candidates.   Vocational Rehab Evaluation & Intervention:     Vocational Rehab - 09/20/16 1219      Initial Vocational Rehab Evaluation & Intervention   Assessment shows need for Vocational Rehabilitation No      Education: Education Goals: Education classes will be provided on a weekly basis, covering required topics. Participant will state understanding/return demonstration of topics presented.  Learning Barriers/Preferences:     Learning Barriers/Preferences - 09/20/16 1218      Learning Barriers/Preferences   Learning Barriers None   Learning Preferences None      Education Topics: General Nutrition Guidelines/Fats and Fiber: -Group instruction provided by verbal, written material, models and posters to present the general guidelines for heart healthy nutrition. Gives an explanation and review of dietary fats and fiber. Flowsheet Row Cardiac Rehab from 11/03/2016 in [facility] Cardiac and Pulmonary Rehab  Date  11/01/16  Educator  PI  Instruction Review Code  2- meets goals/outcomes      Controlling Sodium/Reading Food Labels: -Group verbal and written material supporting the discussion of sodium use in heart healthy nutrition. Review and explanation with models, verbal and written materials for utilization  of the food label.   Exercise Physiology & Risk Factors: - Group verbal and written instruction with models to review the exercise physiology of the cardiovascular system and associated critical values. Details cardiovascular disease risk factors and the goals associated with each risk factor.   Aerobic Exercise & Resistance Training: - Gives group verbal and written discussion on the health impact of inactivity. On the components of aerobic and resistive training programs and the benefits of this training and how to safely progress through these programs.   Flexibility, Balance, General Exercise Guidelines: - Provides group verbal and written instruction on the benefits of flexibility and balance training programs. Provides general exercise guidelines with specific guidelines to those with heart or lung disease. Demonstration and skill practice provided.   Stress Management: - Provides group verbal and written instruction about the health risks of elevated stress, cause of high stress, and healthy ways to  reduce stress. Flowsheet Row Cardiac Rehab from 11/03/2016 in [facility] Cardiac and Pulmonary Rehab  Date  10/06/16  Educator  [facility]  Instruction Review Code  2- meets goals/outcomes      Depression: - Provides group verbal and written instruction on the correlation between heart/lung disease and depressed mood, treatment options, and the stigmas associated with seeking treatment. Flowsheet Row Cardiac Rehab from 11/03/2016 in [facility] Cardiac and Pulmonary Rehab  Date  11/03/16  Educator  [facility]  Instruction Review Code  2- meets goals/outcomes      Anatomy & Physiology of the Heart: - Group verbal and written instruction and models provide basic cardiac anatomy and physiology, with the coronary electrical and arterial systems. Review of: AMI, Angina, Valve disease, Heart Failure, Cardiac Arrhythmia, Pacemakers, and the ICD. Flowsheet Row Cardiac Rehab from 11/03/2016 in [facility] Cardiac and Pulmonary  Rehab  Date  10/04/16  Educator  MA  Instruction Review Code  2- meets goals/outcomes      Cardiac Procedures: - Group verbal and written instruction and models to describe the testing methods done to diagnose heart disease. Reviews the outcomes of the test results. Describes the treatment choices: Medical Management, Angioplasty, or Coronary Bypass Surgery. Flowsheet Row Cardiac Rehab from 11/03/2016 in [facility] Cardiac and Pulmonary Rehab  Date  10/11/16  Educator  CE  Instruction Review Code  2- meets goals/outcomes      Cardiac Medications: - Group verbal and written instruction to review commonly prescribed medications for heart disease. Reviews the medication, class of the drug, and side effects. Includes the steps to properly store meds and maintain the prescription regimen. Flowsheet Row Cardiac Rehab from 11/03/2016 in [facility] Cardiac and Pulmonary Rehab  Date  10/20/16  Educator  CE  Instruction Review Code  2- meets goals/outcomes      Go Sex-Intimacy & Heart Disease, Get SMART - Goal Setting: - Group verbal and written instruction through game format to discuss heart disease and the return to sexual intimacy. Provides group verbal and written material to discuss and apply goal setting through the application of the S.M.A.R.T. Method. Flowsheet Row Cardiac Rehab from 11/03/2016 in [facility] Cardiac and Pulmonary Rehab  Date  10/11/16  Educator  CE  Instruction Review Code  2- meets goals/outcomes      Other Matters of the Heart: - Provides group verbal, written materials and models to describe Heart Failure, Angina, Valve Disease, and Diabetes in the realm of heart disease. Includes description of the disease process and treatment options available to the cardiac patient. Flowsheet Row Cardiac Rehab from 11/03/2016 in [facility] Cardiac and Pulmonary Rehab  Date  10/04/16  Educator  MA  Instruction Review Code  2- meets goals/outcomes      Exercise & Equipment Safety: - Individual verbal  instruction and demonstration of equipment use and safety with use of the equipment. Flowsheet Row Cardiac Rehab from 11/03/2016 in [facility] Cardiac and Pulmonary Rehab  Date  09/20/16  Educator  CE  Instruction Review Code  2- meets goals/outcomes      Infection Prevention: - Provides verbal and written material to individual with discussion of infection control including proper hand washing and proper equipment cleaning during exercise session. Flowsheet Row Cardiac Rehab from 11/03/2016 in [facility] Cardiac and Pulmonary Rehab  Date  09/20/16  Educator  CE  Instruction Review Code  2- meets goals/outcomes      Falls Prevention: - Provides verbal and written material to individual with discussion of falls prevention and safety. Flowsheet Row Cardiac  Rehab from 11/03/2016 in [facility] Cardiac and Pulmonary Rehab  Date  09/20/16  Educator  CE  Instruction Review Code  2- meets goals/outcomes      Diabetes: - Individual verbal and written instruction to review signs/symptoms of diabetes, desired ranges of glucose level fasting, after meals and with exercise. Advice that pre and post exercise glucose checks will be done for 3 sessions at entry of program.    Knowledge Questionnaire Score:     Knowledge Questionnaire Score - 09/20/16 1218      Knowledge Questionnaire Score   Pre Score 22/28      Core Components/Risk Factors/Patient Goals at Admission:     Personal Goals and Risk Factors at Admission - 09/20/16 1212      Core Components/Risk Factors/Patient Goals on Admission    Weight Management Yes;Weight Maintenance   Intervention Weight Management: Develop a combined nutrition and exercise program designed to reach desired caloric intake, while maintaining appropriate intake of nutrient and fiber, sodium and fats, and appropriate energy expenditure required for the weight goal.;Weight Management: Provide education and appropriate resources to help participant work on and attain dietary  goals.   Admit Weight 142 lb 6.4 oz (64.6 kg)   Expected Outcomes Weight Maintenance: Understanding of the daily nutrition guidelines, which includes 25-35% calories from fat, 7% or less cal from saturated fats, less than 256m cholesterol, less than 1.5gm of sodium, & 5 or more servings of fruits and vegetables daily   Sedentary Yes   Intervention Provide advice, education, support and counseling about physical activity/exercise needs.;Develop an individualized exercise prescription for aerobic and resistive training based on initial evaluation findings, risk stratification, comorbidities and participant's personal goals.   Expected Outcomes Achievement of increased cardiorespiratory fitness and enhanced flexibility, muscular endurance and strength shown through measurements of functional capacity and personal statement of participant.   Increase Strength and Stamina Yes   Intervention Provide advice, education, support and counseling about physical activity/exercise needs.;Develop an individualized exercise prescription for aerobic and resistive training based on initial evaluation findings, risk stratification, comorbidities and participant's personal goals.   Expected Outcomes Achievement of increased cardiorespiratory fitness and enhanced flexibility, muscular endurance and strength shown through measurements of functional capacity and personal statement of participant.   Hypertension Yes   Intervention Provide education on lifestyle modifcations including regular physical activity/exercise, weight management, moderate sodium restriction and increased consumption of fresh fruit, vegetables, and low fat dairy, alcohol moderation, and smoking cessation.;Monitor prescription use compliance.   Expected Outcomes Short Term: Continued assessment and intervention until BP is < 140/959mHG in hypertensive participants. < 130/8060mG in hypertensive participants with diabetes, heart failure or chronic kidney  disease.;Long Term: Maintenance of blood pressure at goal levels.   Lipids Yes  Cannot tolerate Statins. HAs drasctically changed eating habits to help control Cholesterol Risk Factor   Intervention Provide education and support for participant on nutrition & aerobic/resistive exercise along with prescribed medications to achieve LDL <58m43mDL >40mg80mExpected Outcomes Short Term: Participant states understanding of desired cholesterol values and is compliant with medications prescribed. Participant is following exercise prescription and nutrition guidelines.;Long Term: Cholesterol controlled with medications as prescribed, with individualized exercise RX and with personalized nutrition plan. Value goals: LDL < 58mg,22m > 40 mg.      Core Components/Risk Factors/Patient Goals Review:      Goals and Risk Factor Review    Row Name 10/21/16 1710 11/10/16 1617  Core Components/Risk Factors/Patient Goals Review   Personal Goals Review Hypertension;Lipids;Increase Strength and Stamina  -      Review Larrys blood pressures have been within normal limits.  He started back to work this week and ws tired first day but second day felt ok.  He will have cholesterol checked in March. 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 Blood pressure is good Lipid blood work will be done soon. March 23      Expected Outcomes  - Cont heart healthy lifestyle.         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 Blood pressure is good Lipid blood work will be done soon. March 23   Expected Outcomes Cont heart healthy lifestyle.      ITP Comments:     ITP Comments    Row Name 09/20/16 1207 09/29/16 0558 10/27/16 0624       ITP Comments Medical REview  completed.  Initial ITP created. Documnetation of diagnosis can be found in [facility] Encounter 08/09/2016 30 day review. Continue with ITP unless directed changes per Medical Director review.  Has not attended session since Medical REview 30 day review. Continue with ITP unless directed changes per Medical Director review        Comments:

## 2016-11-11 DIAGNOSIS — Z9889 Other specified postprocedural states: Secondary | ICD-10-CM | POA: Diagnosis not present

## 2016-11-11 DIAGNOSIS — Z951 Presence of aortocoronary bypass graft: Secondary | ICD-10-CM | POA: Diagnosis not present

## 2016-11-11 DIAGNOSIS — Z48812 Encounter for surgical aftercare following surgery on the circulatory system: Secondary | ICD-10-CM | POA: Diagnosis not present

## 2016-11-11 NOTE — Progress Notes (Signed)
Daily Session Note  Patient Details  Name: Thomas Fuentes MRN: [id number] Date of Birth: 09/10/1940 Referring Provider:     Cardiac Rehab from 09/20/2016 in [facility] Cardiac and Pulmonary Rehab  Referring Provider  Arida, Muhammad MD      Encounter Date: 11/11/2016  Check In:     Session Check In - 11/11/16 1700      Check-In   Location [facility]-Cardiac & Pulmonary Rehab   Staff Present Patricia Surles RN BSN;Kelly Hayes, BS, ACSM CEP, Exercise Physiologist;Amanda Sommer, BA, ACSM CEP, Exercise Physiologist   Supervising physician immediately available to respond to emergencies See telemetry face sheet for immediately available ER MD   Medication changes reported     No   Fall or balance concerns reported    No   Warm-up and Cool-down Performed on first and last piece of equipment   Resistance Training Performed Yes   VAD Patient? No     Pain Assessment   Currently in Pain? No/denies         History  Smoking Status  . Former Smoker  . Years: 40.00  . Types: Cigarettes  . Quit date: 02/06/1999  Smokeless Tobacco  . Never Used    Goals Met:  Independence with exercise equipment Exercise tolerated well No report of cardiac concerns or symptoms Strength training completed today  Goals Unmet:  Not Applicable  Comments:  Patient was able to complete exercise with no symptoms.    Dr. Mark Miller is Medical Director for [facility] and [facility]. 

## 2016-11-12 ENCOUNTER — Other Ambulatory Visit: Payer: Self-pay | Admitting: Physician Assistant

## 2016-11-15 ENCOUNTER — Encounter: Payer: Medicare Other | Admitting: *Deleted

## 2016-11-15 DIAGNOSIS — Z9889 Other specified postprocedural states: Secondary | ICD-10-CM | POA: Diagnosis not present

## 2016-11-15 DIAGNOSIS — Z951 Presence of aortocoronary bypass graft: Secondary | ICD-10-CM | POA: Diagnosis not present

## 2016-11-15 DIAGNOSIS — Z48812 Encounter for surgical aftercare following surgery on the circulatory system: Secondary | ICD-10-CM | POA: Diagnosis not present

## 2016-11-15 NOTE — Progress Notes (Signed)
Daily Session Note  Patient Details  Name: BRAXTYN BOJARSKI MRN: [id number] Date of Birth: [birth year] Referring Provider:     Cardiac Rehab from 09/20/2016 in [facility] Cardiac and Pulmonary Rehab  Referring Provider  Kathlyn Sacramento MD      Encounter Date: 11/15/2016  Check In:     Session Check In - 11/15/16 1629      Check-In   Location [facility]iac [facility] Rehab   Staff Present Heath Lark, RN, BSN, CCRP;Disaya Walt, RN, Moises Blood, BS, ACSM CEP, Exercise Physiologist   Supervising physician immediately available to respond to emergencies See telemetry face sheet for immediately available ER MD   Medication changes reported     No   Fall or balance concerns reported    No   Warm-up and Cool-down Performed on first and last piece of equipment   Resistance Training Performed Yes   VAD Patient? No     Pain Assessment   Currently in Pain? No/denies         History  Smoking Status  . Former Smoker  . Years: 40.00  . Types: Cigarettes  . Quit date: 02/06/1999  Smokeless Tobacco  . Never Used    Goals Met:  Proper associated with RPD/PD & O2 Sat Exercise tolerated well  Goals Unmet:  Not Applicable  Comments:     Dr. Emily Filbert is Medical Director for [place] [facility] and [facility].

## 2016-11-17 DIAGNOSIS — Z951 Presence of aortocoronary bypass graft: Secondary | ICD-10-CM

## 2016-11-17 DIAGNOSIS — Z48812 Encounter for surgical aftercare following surgery on the circulatory system: Secondary | ICD-10-CM | POA: Diagnosis not present

## 2016-11-17 DIAGNOSIS — Z9889 Other specified postprocedural states: Secondary | ICD-10-CM | POA: Diagnosis not present

## 2016-11-17 NOTE — Progress Notes (Signed)
Daily Session Note  Patient Details  Name: Thomas Fuentes MRN: [id number] Date of Birth: [birth year] Referring Provider:     Cardiac Rehab from 09/20/2016 in [facility] Cardiac and Pulmonary Rehab  Referring Provider  Kathlyn Sacramento MD      Encounter Date: 11/17/2016  Check In:     Session Check In - 11/17/16 1617      Check-In   Location [facility] Re[facility]   Staff Present Gerlene Burdock, RN, BSN;Linnie Mcglocklin, DPT, Burlene Arnt, BA, ACSM CEP, Exercise Physiologist   Supervising physician immediately available to respond to emergencies See telemetry face sheet for immediately available ER MD   Medication changes reported     No   Fall or balance concerns reported    No   Tobacco Cessation No Change   Warm-up and Cool-down Performed on first and last piece of equipment   Resistance Training Performed Yes   VAD Patient? No     Pain Assessment   Currently in Pain? No/denies   Multiple Pain Sites No         History  Smoking Status  . Former Smoker  . Years: 40.00  . Types: Cigarettes  . Quit date: 02/06/1999  Smokeless Tobacco  . Never Used    Goals Met:  Independence with exercise equipment  Goals Unmet:  Not Applicable  Comments: Patient completed exercise prescription and all exercise goals during rehab session. The exercise was tolerated well and the patient is progressing in the program.    Dr. Emily Filbert is Medical Director for [place] and [facility].

## 2016-11-18 DIAGNOSIS — Z951 Presence of aortocoronary bypass graft: Secondary | ICD-10-CM | POA: Diagnosis not present

## 2016-11-18 DIAGNOSIS — Z9889 Other specified postprocedural states: Secondary | ICD-10-CM | POA: Diagnosis not present

## 2016-11-18 DIAGNOSIS — Z48812 Encounter for surgical aftercare following surgery on the circulatory system: Secondary | ICD-10-CM | POA: Diagnosis not present

## 2016-11-18 NOTE — Progress Notes (Signed)
Daily Session Note  Patient Details  Name: Thomas Fuentes MRN: [id number] Date of Birth: [birth year] Referring Provider:     Cardiac Rehab from 09/20/2016 in [facility] Cardiac and Pulmonary Rehab  Referring Provider  Kathlyn Sacramento MD      Encounter Date: 11/18/2016  Check In:     Session Check In - 11/18/16 1652      Check-In   Location [facility]Cardiac & Pulmonary Rehab   Staff Present Heath Lark, RN, BSN, Laveda Norman, BS, ACSM CEP, Exercise Physiologist;Starr Engel Brayton El, DPT, CEEA   Supervising physician immediately available to respond to emergencies See telemetry face sheet for immediately available ER MD   Medication changes reported     No   Fall or balance concerns reported    No   Tobacco Cessation No Change   Warm-up and Cool-down Performed on first and last piece of equipment   Resistance Training Performed Yes   VAD Patient? No     Pain Assessment   Currently in Pain? No/denies   Multiple Pain Sites No           Exercise Prescription Changes - 11/18/16 1000      Response to Exercise   Blood Pressure (Admit) 128/66   Blood Pressure (Exercise) 148/72   Heart Rate (Admit) 44 bpm   Heart Rate (Exercise) 66 bpm   Heart Rate (Exit) 51 bpm   Rating of Perceived Exertion (Exercise) 13   Symptoms none   Duration Progress to 45 minutes of aerobic exercise without signs/symptoms of physical distress   Intensity THRR unchanged     Progression   Progression Continue to progress workloads to maintain intensity without signs/symptoms of physical distress.   Average METs 4.1     Resistance Training   Training Prescription Yes   Weight 4   Reps 10-15     Interval Training   Interval Training No      History  Smoking Status  . Former Smoker  . Years: 40.00  . Types: Cigarettes  . Quit date: 02/06/1999  Smokeless Tobacco  . Never Used    Goals Met:  Independence with exercise equipment  Goals Unmet:  Not Applicable  Comments: Patient completed  exercise prescription and all exercise goals during rehab session. The exercise was tolerated well and the patient is progressing in the program.    Dr. Emily Filbert is Medical Director for [place] and [facility].

## 2016-11-19 ENCOUNTER — Ambulatory Visit (INDEPENDENT_AMBULATORY_CARE_PROVIDER_SITE_OTHER): Payer: Medicare Other | Admitting: Cardiovascular Disease

## 2016-11-19 ENCOUNTER — Encounter: Payer: Self-pay | Admitting: Cardiovascular Disease

## 2016-11-19 VITALS — BP 142/80 | HR 38 | Ht 67.5 in | Wt 142.5 lb

## 2016-11-19 DIAGNOSIS — I714 Abdominal aortic aneurysm, without rupture, unspecified: Secondary | ICD-10-CM

## 2016-11-19 DIAGNOSIS — R001 Bradycardia, unspecified: Secondary | ICD-10-CM

## 2016-11-19 DIAGNOSIS — I10 Essential (primary) hypertension: Secondary | ICD-10-CM

## 2016-11-19 DIAGNOSIS — I2510 Atherosclerotic heart disease of native coronary artery without angina pectoris: Secondary | ICD-10-CM | POA: Diagnosis not present

## 2016-11-19 DIAGNOSIS — E785 Hyperlipidemia, unspecified: Secondary | ICD-10-CM | POA: Diagnosis not present

## 2016-11-19 MED ORDER — LISINOPRIL 10 MG PO TABS: 10.0000 mg | ORAL_TABLET | Freq: Every day | ORAL | 3 refills | Status: DC

## 2016-11-19 NOTE — Patient Instructions (Signed)
Medication Instructions:  Your physician has recommended you make the following change in your medication:  STOP taking metoprolol STOP taking amiodarone INCREASE lisinopril to 10mg  once daily   Labwork: Liver, lipid, BMET, TSH today  Testing/Procedures: none  Follow-Up: Your physician recommends that you schedule a follow-up appointment in: 3 months with Dr. Fletcher Anon.    Any Other Special Instructions Will Be Listed Below (If Applicable).     If you need a refill on your cardiac medications before your next appointment, please call your pharmacy.

## 2016-11-19 NOTE — Progress Notes (Signed)
Cardiology Office Note   Date:  11/19/2016   ID:  Thomas Fuentes, DOB 1940/12/02, MRN [id number]  PCP:  Marton Redwood, MD  Cardiologist:   Kathlyn Sacramento, MD   Chief Complaint  Patient presents with  . other    3 month follow up. Meds reviewed by the pt. verbally. Pt. c/o decreased heart rate and feeling fatigue.       History of Present Illness: Thomas Fuentes is a 76 y.o. male who presents for a follow-up visit regarding coronary artery disease s/p CABG in 07/2016 after NSTEMI.   He has chronic medical conditions that include hypertension, hyperlipidemia (intolerant to statins), type 2 diabetes and chronic kidney disease.  He continues to be on Amiodarone for postoperative atrial fibrillation.He has been attending cardiac rehabilitation and has been having intermittent bradycardia with heart rate in the upper 30s occasionally. He complains of dizziness with no syncope. He has been taking his medications regularly. He denies any chest pain or shortness of breath.    Past Medical History:  Diagnosis Date  . AAA (abdominal aortic aneurysm) ([place])   . Chronic renal disease, stage III   . Coronary artery disease    a. MI 2000 s/p PCI and 2 stent placement to unknown arteries;  LHC 08/09/2016 o-pLAD 30%, mLAD 99%, OM2 50%, OM3 85%, pRCA 99%, mid RCA 60%, RPDA 60%, LVEDP mod elevated. CABG in 07/2016 :  LIMA to LAD, SVG to RCA and SVG to distal left circumflex  . Diabetes mellitus without complication ([place])   . Dyslipidemia   . Hyperlipidemia   . Hypertension   . Hypothyroidism   . Macular degeneration    bilateral  . Metabolic syndrome   . MI (myocardial infarction)   . Peripheral neuropathy ([place])   . Peripheral vascular disease [facility])     Past Surgical History:  Procedure Laterality Date  . CARDIAC CATHETERIZATION  2000   [place] x2 stents  . CARDIAC CATHETERIZATION N/A 08/09/2016   Procedure: Left Heart Cath and Coronary Angiography;  Surgeon: Wellington Hampshire, MD;   Location: [place] CV LAB;  Service: Cardiovascular;  Laterality: N/A;  . CORONARY ARTERY BYPASS GRAFT N/A 08/11/2016   Procedure: CORONARY ARTERY BYPASS GRAFTING on pump using left internal mammary artery to left anterior descending coronary artery , portion of right greater saphenous vein to right coronary artery, portion of right greater saphenous vein graft to distal circumflex.;  Surgeon: [name], MD;  Location: [place];  Service: Open Heart Surgery;  Laterality: N/A;  . CORONARY STENT PLACEMENT    . ENDOVEIN HARVEST OF GREATER SAPHENOUS VEIN Right 08/11/2016   Procedure: ENDOVEIN HARVEST OF GREATER SAPHENOUS VEIN;  Surgeon: [name], MD;  Location: [place];  Service: Open Heart Surgery;  Laterality: Right;  . TEE WITHOUT CARDIOVERSION N/A 08/11/2016   Procedure: TRANSESOPHAGEAL ECHOCARDIOGRAM (TEE);  Surgeon: [name], MD;  Location: [place];  Service: Open Heart Surgery;  Laterality: N/A;     Current Outpatient Prescriptions  Medication Sig Dispense Refill  . amiodarone (PACERONE) 200 MG tablet Take 1 tablet (200 mg total) by mouth daily. 30 tablet 1  . aspirin EC 81 MG EC tablet Take 1 tablet (81 mg total) by mouth daily.    Marland Kitchen ezetimibe (ZETIA) 10 MG tablet Take 1 tablet (10 mg total) by mouth daily. 30 tablet 1  . gabapentin (NEURONTIN) 600 MG tablet Take 0.5 tablets (300 mg total) by mouth 3 (three) times daily. 45 tablet  0  . levothyroxine (SYNTHROID, LEVOTHROID) 25 MCG tablet Take 1 tablet (25 mcg total) by mouth daily before breakfast. 30 tablet 1  . lisinopril (PRINIVIL,ZESTRIL) 5 MG tablet Take 1 tablet (5 mg total) by mouth daily. 30 tablet 1  . metoprolol tartrate (LOPRESSOR) 25 MG tablet Take 0.5 tablets (12.5 mg total) by mouth 2 (two) times daily. 30 tablet 1   No current facility-administered medications for this visit.     Allergies:   Lidocaine and Statins    Social History:  The patient  reports that he quit smoking about 17 years ago. His  smoking use included Cigarettes. He quit after 40.00 years of use. He has never used smokeless tobacco. He reports that he drinks alcohol. He reports that he does not use drugs.   Family History:  The patient's family history includes Hypertension in his mother.    ROS:  Please see the history of present illness.   Otherwise, review of systems are positive for none.   All other systems are reviewed and negative.    PHYSICAL EXAM: VS:  BP (!) 142/80 (BP Location: Left Arm, Patient Position: Sitting, Cuff Size: Normal)   Pulse (!) 38   Ht 5' 7.5" (1.715 m)   Wt 142 lb 8 oz (64.6 kg)   BMI 21.99 kg/m  , BMI Body mass index is 21.99 kg/m. GEN: Well nourished, well developed, in no acute distress  HEENT: normal  Neck: no JVD, carotid bruits, or masses Cardiac: RRR with severe bradycardia; no murmurs, rubs, or gallops,no edema  Respiratory:  clear to auscultation bilaterally, normal work of breathing GI: soft, nontender, nondistended, + BS MS: no deformity or atrophy  Skin: warm and dry, no rash Neuro:  Strength and sensation are intact Psych: euthymic mood, full affect   EKG:  EKG is  ordered today. EKG showed sinus bradycardia with first-degree AV block. Heart rate was 38 bpm.   Recent Labs: 08/09/2016: TSH 6.215 08/10/2016: ALT 11 08/14/2016: Magnesium 1.8 08/15/2016: BUN 17; Creatinine, Ser 1.13; Hemoglobin 8.5; Platelets 210; Potassium 4.1; Sodium 137    Lipid Panel    Component Value Date/Time   CHOL 192 08/09/2016 0719   TRIG 268 (H) 08/09/2016 0719   HDL 27 (L) 08/09/2016 0719   CHOLHDL 7.1 08/09/2016 0719   VLDL 54 (H) 08/09/2016 0719   LDLCALC 111 (H) 08/09/2016 0719      Wt Readings from Last 3 Encounters:  11/19/16 142 lb 8 oz (64.6 kg)  10/07/16 141 lb 6.4 oz (64.1 kg)  09/20/16 142 lb 6.4 oz (64.6 kg)        ASSESSMENT AND PLAN:  1.  Coronary artery disease involving native coronary arteries without angina:  He is doing well with no anginal  symptoms. Continue medical therapy and cardiac rehabilitation.  2. Postoperative atrial fibrillation:  No evidence of recurrent arrhythmia and given significant bradycardia, I discontinued amiodarone.  3. Symptomatic sinus bradycardia: Likely medication induced. I discontinued metoprolol and amiodarone. Check TSH.   3. Abdominal aortic aneurysm:  He had repeat ultrasound in February which showed stable size at 3.3 cm. Repeat study in 2 years.  4. Hyperlipidemia: The patient is known to be intolerant to statins due to GI symptoms and currently he is on Zetia. I requested lipid and liver profile and if LDL is above 70, I plan on trying small dose rosuvastatin.  5. Essential hypertension: Blood pressure is mildly elevated. I increased lisinopril to 10 mg daily. Check basic metabolic profile.  6. Mildly dilated aortic root at 4.2 cm. I doubt that this would reach clinical significance during his lifetime. This can be followed by echocardiogram in order to save radiation and contrast from CT scan.  Disposition:   FU with me in 3 months  Signed,  Kathlyn Sacramento, MD  11/19/2016 8:29 AM    [place]

## 2016-11-20 LAB — LIPID PANEL
CHOLESTEROL TOTAL: 192 mg/dL (ref 100–199)
Chol/HDL Ratio: 5.3 ratio units — ABNORMAL HIGH (ref 0.0–5.0)
HDL: 36 mg/dL — ABNORMAL LOW (ref >39)
LDL Calculated: 116 mg/dL — ABNORMAL HIGH (ref 0–99)
Triglycerides: 198 mg/dL — ABNORMAL HIGH (ref 0–149)
VLDL Cholesterol Cal: 40 mg/dL (ref 5–40)

## 2016-11-20 LAB — HEPATIC FUNCTION PANEL
ALBUMIN: 4.3 g/dL (ref 3.5–4.8)
ALK PHOS: 67 IU/L (ref 39–117)
ALT: 8 IU/L (ref 0–44)
AST: 12 IU/L (ref 0–40)
BILIRUBIN TOTAL: 0.7 mg/dL (ref 0.0–1.2)
BILIRUBIN, DIRECT: 0.19 mg/dL (ref 0.00–0.40)
Total Protein: 7.2 g/dL (ref 6.0–8.5)

## 2016-11-20 LAB — BASIC METABOLIC PANEL
BUN/Creatinine Ratio: 21 (ref 10–24)
BUN: 23 mg/dL (ref 8–27)
CHLORIDE: 101 mmol/L (ref 96–106)
CO2: 26 mmol/L (ref 18–29)
Calcium: 9.5 mg/dL (ref 8.6–10.2)
Creatinine, Ser: 1.12 mg/dL (ref 0.76–1.27)
GFR calc Af Amer: 72 mL/min/{1.73_m2} (ref >59)
GFR calc non Af Amer: 63 mL/min/{1.73_m2} (ref >59)
GLUCOSE: 94 mg/dL (ref 65–99)
POTASSIUM: 4.8 mmol/L (ref 3.5–5.2)
SODIUM: 143 mmol/L (ref 134–144)

## 2016-11-20 LAB — TSH: TSH: 4.96 u[IU]/mL — ABNORMAL HIGH (ref 0.450–4.500)

## 2016-11-22 ENCOUNTER — Encounter: Payer: Medicare Other | Admitting: *Deleted

## 2016-11-22 VITALS — Ht 67.1 in | Wt 144.0 lb

## 2016-11-22 DIAGNOSIS — Z9889 Other specified postprocedural states: Secondary | ICD-10-CM | POA: Diagnosis not present

## 2016-11-22 DIAGNOSIS — Z951 Presence of aortocoronary bypass graft: Secondary | ICD-10-CM

## 2016-11-22 DIAGNOSIS — Z48812 Encounter for surgical aftercare following surgery on the circulatory system: Secondary | ICD-10-CM | POA: Diagnosis not present

## 2016-11-22 NOTE — Progress Notes (Signed)
Daily Session Note  Patient Details  Name: Thomas Fuentes MRN: [id number] Date of Birth: [birth year] Referring Provider:     Cardiac Rehab from 09/20/2016 in [facility] Cardiac and Pulmonary Rehab  Referring Provider  Kathlyn Sacramento MD      Encounter Date: 11/22/2016  Check In:     Session Check In - 11/22/16 1625      Check-In   Location [facility]Cardiac & Pulmonary Rehab   Staff Present Heath Lark, RN, BSN, CCRP;Carroll Enterkin, RN, Moises Blood, BS, ACSM CEP, Exercise Physiologist   Supervising physician immediately available to respond to emergencies See telemetry face sheet for immediately available ER MD   Medication changes reported     No   Fall or balance concerns reported    Yes   Warm-up and Cool-down Performed on first and last piece of equipment   Resistance Training Performed Yes   VAD Patient? No     Pain Assessment   Currently in Pain? No/denies         History  Smoking Status  . Former Smoker  . Years: 40.00  . Types: Cigarettes  . Quit date: 02/06/1999  Smokeless Tobacco  . Never Used    Goals Met:  Exercise tolerated well No report of cardiac concerns or symptoms Strength training completed today  Goals Unmet:  Not Applicable  Comments: Doing well with exercise prescription progression.  Med change noted. Thomas Fuentes will bring details next visit.      [place] Name 09/20/16 1407 11/22/16 1818       6 Minute Walk   Phase Initial Discharge    Distance 1350 feet 1675 feet    Distance % Change  - 2.4 %    Walk Time 6 minutes 6 minutes    # of Rest Breaks 0 0    MPH 2.56 3.17    METS 2.89 3.82    RPE 9 12    VO2 Peak 10.11 13.3    Symptoms No No    Resting HR 46 bpm 71 bpm    Resting BP 126/64 132/80    Max Ex. HR 97 bpm 110 bpm    Max Ex. BP 132/70 162/80    2 Minute Post BP 130/60  -      Reviewed 6 min walk test data with patient and discussed his progression. He plans to join the [employer] or hospital fitness center after  graduation.    Dr. Emily Filbert is Medical Director for [place] and [facility].

## 2016-11-24 ENCOUNTER — Other Ambulatory Visit: Payer: Self-pay

## 2016-11-24 ENCOUNTER — Telehealth: Payer: Self-pay | Admitting: Cardiovascular Disease

## 2016-11-24 ENCOUNTER — Encounter: Payer: Self-pay | Admitting: *Deleted

## 2016-11-24 ENCOUNTER — Encounter: Payer: Medicare Other | Admitting: *Deleted

## 2016-11-24 DIAGNOSIS — R7989 Other specified abnormal findings of blood chemistry: Secondary | ICD-10-CM

## 2016-11-24 DIAGNOSIS — Z48812 Encounter for surgical aftercare following surgery on the circulatory system: Secondary | ICD-10-CM | POA: Diagnosis not present

## 2016-11-24 DIAGNOSIS — H353212 Exudative age-related macular degeneration, right eye, with inactive choroidal neovascularization: Secondary | ICD-10-CM | POA: Diagnosis not present

## 2016-11-24 DIAGNOSIS — Z9889 Other specified postprocedural states: Secondary | ICD-10-CM | POA: Diagnosis not present

## 2016-11-24 DIAGNOSIS — Z951 Presence of aortocoronary bypass graft: Secondary | ICD-10-CM

## 2016-11-24 DIAGNOSIS — E785 Hyperlipidemia, unspecified: Secondary | ICD-10-CM

## 2016-11-24 MED ORDER — ROSUVASTATIN CALCIUM 5 MG PO TABS: 5.0000 mg | ORAL_TABLET | Freq: Every day | ORAL | 3 refills | Status: DC

## 2016-11-24 NOTE — Telephone Encounter (Signed)
See note in lab results

## 2016-11-24 NOTE — Progress Notes (Signed)
Daily Session Note  Patient Details  Name: Thomas Fuentes MRN: [id number] Date of Birth: March 03, 1941 Referring Provider:     Cardiac Rehab from 09/20/2016 in [facility] Cardiac and Pulmonary Rehab  Referring Provider  Kathlyn Sacramento MD      Encounter Date: 11/24/2016  Check In:     Session Check In - 11/24/16 1708      Check-In   Location [facility]-Cardiac & Pulmonary Rehab   Staff Present Nyoka Cowden, RN, BSN, MA;Joseph Johns, RN, Vickki Hearing, BA, ACSM CEP, Exercise Physiologist   Supervising physician immediately available to respond to emergencies See telemetry face sheet for immediately available ER MD   Medication changes reported     --  Amiodorane, Metoprolol stopped by MD for heart rate of 39.   Fall or balance concerns reported    No   Warm-up and Cool-down Performed on first and last piece of equipment   Resistance Training Performed Yes   VAD Patient? No         History  Smoking Status  . Former Smoker  . Years: 40.00  . Types: Cigarettes  . Quit date: 02/06/1999  Smokeless Tobacco  . Never Used    Goals Met:  Proper associated with RPD/PD & O2 Sat Exercise tolerated well  Goals Unmet:  Not Applicable  Comments:     Dr. Emily Filbert is Medical Director for [place] and [facility].

## 2016-11-24 NOTE — Telephone Encounter (Signed)
Patient would like lab results.

## 2016-11-24 NOTE — Progress Notes (Signed)
Cardiac Individual Treatment Plan  Patient Details  Name: Thomas Fuentes MRN: [id number] Date of Birth: Oct 08, 1940 Referring Provider:     Cardiac Rehab from 09/20/2016 in [facility] Cardiac and Pulmonary Rehab  Referring Provider  Kathlyn Sacramento MD      Initial Encounter Date:    Cardiac Rehab from 09/20/2016 in [facility] Cardiac and Pulmonary Rehab  Date  09/20/16  Referring Provider  Kathlyn Sacramento MD      Visit Diagnosis: S/P CABG x 4  Patient's Home Medications on Admission:  Current Outpatient Prescriptions:  .  aspirin EC 81 MG EC tablet, Take 1 tablet (81 mg total) by mouth daily., Disp: , Rfl:  .  ezetimibe (ZETIA) 10 MG tablet, Take 1 tablet (10 mg total) by mouth daily., Disp: 30 tablet, Rfl: 1 .  gabapentin (NEURONTIN) 600 MG tablet, Take 0.5 tablets (300 mg total) by mouth 3 (three) times daily., Disp: 45 tablet, Rfl: 0 .  levothyroxine (SYNTHROID, LEVOTHROID) 25 MCG tablet, Take 1 tablet (25 mcg total) by mouth daily before breakfast., Disp: 30 tablet, Rfl: 1 .  lisinopril (PRINIVIL,ZESTRIL) 10 MG tablet, Take 1 tablet (10 mg total) by mouth daily., Disp: 30 tablet, Rfl: 3  Past Medical History: Past Medical History:  Diagnosis Date  . AAA (abdominal aortic aneurysm) ([place])   . Chronic renal disease, stage III   . Coronary artery disease    a. MI 2000 s/p PCI and 2 stent placement to unknown arteries;  LHC 08/09/2016 o-pLAD 30%, mLAD 99%, OM2 50%, OM3 85%, pRCA 99%, mid RCA 60%, RPDA 60%, LVEDP mod elevated. CABG in 07/2016 :  LIMA to LAD, SVG to RCA and SVG to distal left circumflex  . Diabetes mellitus without complication ([place])   . Dyslipidemia   . Hyperlipidemia   . Hypertension   . Hypothyroidism   . Macular degeneration    bilateral  . Metabolic syndrome   . MI (myocardial infarction)   . Peripheral neuropathy ([place])   . Peripheral vascular disease ([place])     Tobacco Use: History  Smoking Status  . Former Smoker  . Years: 40.00  . Types: Cigarettes  . Quit  date: 02/06/1999  Smokeless Tobacco  . Never Used    Labs: Recent Review Flowsheet Data    Labs for ITP Cardiac and Pulmonary Rehab Latest Ref Rng & Units 08/11/2016 08/11/2016 08/11/2016 08/12/2016 11/19/2016   Cholestrol 100 - 199 mg/dL - - - - 192   LDLCALC 0 - 99 mg/dL - - - - 116(H)   HDL >39 mg/dL - - - - 36(L)   Trlycerides 0 - 149 mg/dL - - - - 198(H)   Hemoglobin A1c 4.8 - 5.6 % - - - - -   PHART 7.350 - 7.450 7.347(L) - 7.369 - -   PCO2ART 32.0 - 48.0 mmHg 39.5 - 40.4 - -   HCO3 20.0 - 28.0 mmol/L 21.5 - 23.2 - -   TCO2 0 - 100 mmol/L '23 23 24 24 '$ -   ACIDBASEDEF 0.0 - 2.0 mmol/L 4.0(H) - 2.0 - -   O2SAT % 98.0 - 97.0 - -       Exercise Target Goals:    Exercise Program Goal: Individual exercise prescription set with THRR, safety & activity barriers. Participant demonstrates ability to understand and report RPE using BORG scale, to self-measure pulse accurately, and to acknowledge the importance of the exercise prescription.  Exercise Prescription Goal: Starting with aerobic activity 30 plus minutes a day, 3 days per  week for initial exercise prescription. Provide home exercise prescription and guidelines that participant acknowledges understanding prior to discharge.  Activity Barriers & Risk Stratification:     Activity Barriers & Cardiac Risk Stratification - 09/20/16 1218      Activity Barriers & Cardiac Risk Stratification   Activity Barriers Other (comment);Incisional Pain   Comments Abdominal Hernia.   Wil not do sit ups.  Occasional pain from vein graft   Cardiac Risk Stratification High      6 Minute Walk:     6 Minute Walk    Row Name 09/20/16 1407 11/22/16 1818       6 Minute Walk   Phase Initial Discharge    Distance 1350 feet 1675 feet    Distance % Change  - 24 %    Walk Time 6 minutes 6 minutes    # of Rest Breaks 0 0    MPH 2.56 3.17    METS 2.89 3.82    RPE 9 12    VO2 Peak 10.11 13.3    Symptoms No No    Resting HR 46 bpm 71 bpm     Resting BP 126/64 132/80    Max Ex. HR 97 bpm 110 bpm    Max Ex. BP 132/70 162/80    2 Minute Post BP 130/60  -       Oxygen Initial Assessment:   Oxygen Re-Evaluation:   Oxygen Discharge (Final Oxygen Re-Evaluation):   Initial Exercise Prescription:     Initial Exercise Prescription - 09/20/16 1400      Date of Initial Exercise RX and Referring Provider   Date 09/20/16   Referring Provider Kathlyn Sacramento MD     Treadmill   MPH 2.5   Grade 0.5   Minutes 15   METs 3.09     NuStep   Level 2   Minutes 15   METs 2     REL-XR   Level 1   Minutes 15   METs 2     Prescription Details   Frequency (times per week) 3   Duration Progress to 45 minutes of aerobic exercise without signs/symptoms of physical distress     Intensity   THRR 40-80% of Max Heartrate 85-124   Ratings of Perceived Exertion 11-15   Perceived Dyspnea 0-4     Progression   Progression Continue to progress workloads to maintain intensity without signs/symptoms of physical distress.     Resistance Training   Training Prescription Yes   Weight 3 lbs   Reps 10-15      Perform Capillary Blood Glucose checks as needed.  Exercise Prescription Changes:     Exercise Prescription Changes    Row Name 09/20/16 1200 10/04/16 1500 10/13/16 0900 10/21/16 1200 11/04/16 1100     Response to Exercise   Blood Pressure (Admit) 126/64 126/70  - 134/70 120/66   Blood Pressure (Exercise) 132/70 118/80  - 130/68 132/66   Blood Pressure (Exit) 130/60 94/52  - 120/60 132/66   Heart Rate (Admit) 46 bpm 59 bpm  - 59 bpm 44 bpm   Heart Rate (Exercise) 99 bpm 99 bpm  - 76 bpm 90 bpm   Heart Rate (Exit) 55 bpm 49 bpm  - 54 bpm 51 bpm   Oxygen Saturation (Admit) 99 %  -  -  -  -   Oxygen Saturation (Exercise) 98 %  -  -  -  -   Rating of Perceived Exertion (Exercise)  - 12  -  12 12   Perceived Dyspnea (Exercise) 9  -  -  -  -   Symptoms none none none none  -   Comments  -  - Home Exercise Guidelines  given 10/13/16  -  -   Duration  - Progress to 45 minutes of aerobic exercise without signs/symptoms of physical distress Progress to 45 minutes of aerobic exercise without signs/symptoms of physical distress Progress to 45 minutes of aerobic exercise without signs/symptoms of physical distress Progress to 45 minutes of aerobic exercise without signs/symptoms of physical distress   Intensity  - THRR unchanged THRR unchanged THRR unchanged THRR unchanged     Progression   Progression  - Continue to progress workloads to maintain intensity without signs/symptoms of physical distress. Continue to progress workloads to maintain intensity without signs/symptoms of physical distress. Continue to progress workloads to maintain intensity without signs/symptoms of physical distress. Continue to progress workloads to maintain intensity without signs/symptoms of physical distress.   Average METs  - 3.3 3.3 3.9 4.1     Resistance Training   Training Prescription  - Yes Yes Yes Yes   Weight  - 3 lbs 3 lbs 4 4   Reps  - 10-15 10-15 10-15 10-15     Interval Training   Interval Training  - No No No No     Treadmill   MPH  - 2.5 2.5 2.8 2.8   Grade  - 0.5 0.'5 1 1   '$ Minutes  - '15 15 15 15   '$ METs  - 3.09 3.09 3.53 3.53     NuStep   Level  - '2 2 4 4   '$ Minutes  - '15 15 15 15   '$ METs  - 2.58 2.58 4.3 4.1     REL-XR   Level  - 1 1  -  -   Minutes  - 15 15  -  -   METs  - 4 4  -  -     Home Exercise Plan   Plans to continue exercise at  -  - Home  walking  -  -   Frequency  -  - Add 2 additional days to program exercise sessions.  -  -     Exercise Review   Progression -  walk test results Yes Yes  -  -   Row Name 11/18/16 1000             Response to Exercise   Blood Pressure (Admit) 128/66       Blood Pressure (Exercise) 148/72       Heart Rate (Admit) 44 bpm       Heart Rate (Exercise) 66 bpm       Heart Rate (Exit) 51 bpm       Rating of Perceived Exertion (Exercise) 13       Symptoms  none       Duration Progress to 45 minutes of aerobic exercise without signs/symptoms of physical distress       Intensity THRR unchanged         Progression   Progression Continue to progress workloads to maintain intensity without signs/symptoms of physical distress.       Average METs 4.1         Resistance Training   Training Prescription Yes       Weight 4       Reps 10-15         Interval Training   Interval Training  No          Exercise Comments:     Exercise Comments    Row Name 09/29/16 0900 10/04/16 1516 10/13/16 0924 10/21/16 1256 11/04/16 1135   Exercise Comments First full day of exercise!  Patient was oriented to gym and equipment including functions, settings, policies, and procedures.  Patient's individual exercise prescription and treatment plan were reviewed.  All starting workloads were established based on the results of the 6 minute walk test done at initial orientation visit.  The plan for exercise progression was also introduced and progression will be customized based on patient's performance and goals. Sirius is off to a good start with three full days.  He is already up to 4 METs on the XR!  We will continue to monitor his progression. Reviewed home exercise with pt today.  Pt plans to walk at home for exercise.  Reviewed THR, pulse, RPE, sign and symptoms, NTG use, and when to call 911 or MD.  Also discussed weather considerations and indoor options.  Pt voiced understanding. Brynn continues to progess well and has added speed and incline to TM and increased overall MET level. Wade continues to tolerate exercise well.   [place] Name 11/18/16 1045 11/22/16 1821         Exercise Comments Fritz Pickerel asked about interval training yesterday.  It is not recommended due to his AAA.  Staff will continue to monitor his progress. Reviewed 6 min walk test data with patient and discussed his progression. He plans to join the [facility] or hospital fitness center after graduation.           Exercise Goals and Review:   Exercise Goals Re-Evaluation :   Discharge Exercise Prescription (Final Exercise Prescription Changes):     Exercise Prescription Changes - 11/18/16 1000      Response to Exercise   Blood Pressure (Admit) 128/66   Blood Pressure (Exercise) 148/72   Heart Rate (Admit) 44 bpm   Heart Rate (Exercise) 66 bpm   Heart Rate (Exit) 51 bpm   Rating of Perceived Exertion (Exercise) 13   Symptoms none   Duration Progress to 45 minutes of aerobic exercise without signs/symptoms of physical distress   Intensity THRR unchanged     Progression   Progression Continue to progress workloads to maintain intensity without signs/symptoms of physical distress.   Average METs 4.1     Resistance Training   Training Prescription Yes   Weight 4   Reps 10-15     Interval Training   Interval Training No      Nutrition:  Target Goals: Understanding of nutrition guidelines, daily intake of sodium '1500mg'$ , cholesterol '200mg'$ , calories 30% from fat and 7% or less from saturated fats, daily to have 5 or more servings of fruits and vegetables.  Biometrics:     Pre Biometrics - 09/20/16 1410      Pre Biometrics   Height 5' 7.1" (1.704 m)   Weight 142 lb 6.4 oz (64.6 kg)   Waist Circumference 35 inches   Hip Circumference 34.5 inches   Waist to Hip Ratio 1.01 %   BMI (Calculated) 22.3   Single Leg Stand 13.97 seconds         Post Biometrics - 11/22/16 1820       Post  Biometrics   Height 5' 7.1" (1.704 m)   Weight 144 lb (65.3 kg)   Waist Circumference 35 inches   Hip Circumference 34.25 inches   Waist to Hip Ratio 1.02 %  BMI (Calculated) 22.5   Single Leg Stand 14 seconds      Nutrition Therapy Plan and Nutrition Goals:     Nutrition Therapy & Goals - 10/11/16 0955      Nutrition Therapy   Diet --  RD appt scheduled      Nutrition Discharge: Rate Your Plate Scores:     Nutrition Assessments - 09/20/16 1209      MEDFICTS Scores    Pre Score 16      Nutrition Goals Re-Evaluation:     Nutrition Goals Re-Evaluation    Row Name 11/10/16 1620             Goals   Comment Ezrael has increased his protein per the dietician suggestions       Expected Outcome Cont heart healthy lifestyle.          Nutrition Goals Discharge (Final Nutrition Goals Re-Evaluation):     Nutrition Goals Re-Evaluation - 11/10/16 1620      Goals   Comment Americo has increased his protein per the dietician suggestions   Expected Outcome Cont heart healthy lifestyle.      Psychosocial: Target Goals: Acknowledge presence or absence of significant depression and/or stress, maximize coping skills, provide positive support system. Participant is able to verbalize types and ability to use techniques and skills needed for reducing stress and depression.   Initial Review & Psychosocial Screening:     Initial Psych Review & Screening - 09/20/16 1216      Initial Review   Current issues with --  States NONE     Family Dynamics   Good Support System? Yes  Wife and children that live close by     Barriers   Psychosocial barriers to participate in program There are no identifiable barriers or psychosocial needs.     Screening Interventions   Interventions Encouraged to exercise      Quality of Life Scores:      Quality of Life - 09/20/16 1216      Quality of Life Scores   Health/Function Pre 21 %   Socioeconomic Pre 21 %   Psych/Spiritual Pre 21 %   Family Pre 21 %   GLOBAL Pre 21 %      PHQ-9: Recent Review Flowsheet Data    Depression screen [facility] 2/9 09/20/2016   Decreased Interest 0   Down, Depressed, Hopeless 0   PHQ - 2 Score 0   Altered sleeping 0   Tired, decreased energy 0   Change in appetite 0   Feeling bad or failure about yourself  0   Trouble concentrating 0   Moving slowly or fidgety/restless 0   Suicidal thoughts 0   PHQ-9 Score 0   Difficult doing work/chores Not difficult at all      Interpretation of Total Score  Total Score Depression Severity:  1-4 = Minimal depression, 5-9 = Mild depression, 10-14 = Moderate depression, 15-19 = Moderately severe depression, 20-27 = Severe depression   Psychosocial Evaluation and Intervention:     Psychosocial Evaluation - 10/04/16 0922      Psychosocial Evaluation & Interventions   Interventions Encouraged to exercise with the program and follow exercise prescription;Relaxation education   Comments Counselor met with Mr. Broyhill today for initial psychosocial evaluation.  He is a 76 year old who had open heart surgery in December and had his first heart attack in 2000.  He has a strong support system with a son and daughter close by and a  significant other who has been part of his life for the past 6 years.  Mr. Colpitts is also actively involved in his local church.  He has some eye problems ans neuropathy but states both are being treated adequately.  He sleeps well and has a good appetite.  Mr. Nill denies a history of depression and anxiety and states he has minimal stress.  He is typically in a positive mood per his report.  He has goals to learn more about healthier lifestyle habits while in this program.  Staff will be following with Mr. Rossbach throughout the course of this program.        Psychosocial Re-Evaluation:     Psychosocial Re-Evaluation    [place] Name 10/25/16 1737 11/10/16 1619           Psychosocial Re-Evaluation   Comments Counselor follow up with Fritz Pickerel reporting feeling stronger and enjoying this class more (changed from morning to afternoon class).  He plans to meet with the dietician later this week to help with meal planning since he put himself "on a strict diet" and wants suggestions on what she should and should not eat at this point.  Ashon states he continues to sleep well.  Counselor commended Kaelon for his progress and his commitment to improved health.    -      Expected Outcomes Yi will meet  with the dietician to determine the best meal plan options for him at this time, and will begin to follow this.  Khaalid will continue to exercise consistently to regain his strength and stamina.   Works in am so doing pm class. Suhaib feels he is doing well.       Continue Psychosocial Services  Follow up required by staff  -         Psychosocial Discharge (Final Psychosocial Re-Evaluation):     Psychosocial Re-Evaluation - 11/10/16 1619      Psychosocial Re-Evaluation   Expected Outcomes Works in am so doing pm class. Nevyn feels he is doing well.       Vocational Rehabilitation: Provide vocational rehab assistance to qualifying candidates.   Vocational Rehab Evaluation & Intervention:     Vocational Rehab - 09/20/16 1219      Initial Vocational Rehab Evaluation & Intervention   Assessment shows need for Vocational Rehabilitation No      Education: Education Goals: Education classes will be provided on a weekly basis, covering required topics. Participant will state understanding/return demonstration of topics presented.  Learning Barriers/Preferences:     Learning Barriers/Preferences - 09/20/16 1218      Learning Barriers/Preferences   Learning Barriers None   Learning Preferences None      Education Topics: General Nutrition Guidelines/Fats and Fiber: -Group instruction provided by verbal, written material, models and posters to present the general guidelines for heart healthy nutrition. Gives an explanation and review of dietary fats and fiber.   Cardiac Rehab from 11/22/2016 in [facility] Cardiac and Pulmonary Rehab  Date  11/01/16  Educator  PI  Instruction Review Code  2- meets goals/outcomes      Controlling Sodium/Reading Food Labels: -Group verbal and written material supporting the discussion of sodium use in heart healthy nutrition. Review and explanation with models, verbal and written materials for utilization of the food label.   Exercise Physiology &  Risk Factors: - Group verbal and written instruction with models to review the exercise physiology of the cardiovascular system and associated critical values. Details cardiovascular disease risk factors and  the goals associated with each risk factor.   Cardiac Rehab from 11/22/2016 in [facility] Cardiac and Pulmonary Rehab  Date  11/15/16  Educator  Tommye Standard, EP  Instruction Review Code  2- meets goals/outcomes      Aerobic Exercise & Resistance Training: - Gives group verbal and written discussion on the health impact of inactivity. On the components of aerobic and resistive training programs and the benefits of this training and how to safely progress through these programs.   Cardiac Rehab from 11/22/2016 in [facility] Cardiac and Pulmonary Rehab  Date  11/17/16  Educator  AS  Instruction Review Code  2- meets goals/outcomes      Flexibility, Balance, General Exercise Guidelines: - Provides group verbal and written instruction on the benefits of flexibility and balance training programs. Provides general exercise guidelines with specific guidelines to those with heart or lung disease. Demonstration and skill practice provided.   Cardiac Rehab from 11/22/2016 in [facility] Cardiac and Pulmonary Rehab  Date  11/22/16  Educator  Tommye Standard, EP  Instruction Review Code  2- meets goals/outcomes      Stress Management: - Provides group verbal and written instruction about the health risks of elevated stress, cause of high stress, and healthy ways to reduce stress.   Cardiac Rehab from 11/22/2016 in [facility] Cardiac and Pulmonary Rehab  Date  10/06/16  Educator  [facility]  Instruction Review Code  2- meets goals/outcomes      Depression: - Provides group verbal and written instruction on the correlation between heart/lung disease and depressed mood, treatment options, and the stigmas associated with seeking treatment.   Cardiac Rehab from 11/22/2016 in [facility] Cardiac and Pulmonary Rehab  Date  11/03/16   Educator  [facility]  Instruction Review Code  2- meets goals/outcomes      Anatomy & Physiology of the Heart: - Group verbal and written instruction and models provide basic cardiac anatomy and physiology, with the coronary electrical and arterial systems. Review of: AMI, Angina, Valve disease, Heart Failure, Cardiac Arrhythmia, Pacemakers, and the ICD.   Cardiac Rehab from 11/22/2016 in [facility] Cardiac and Pulmonary Rehab  Date  10/04/16  Educator  MA  Instruction Review Code  2- meets goals/outcomes      Cardiac Procedures: - Group verbal and written instruction and models to describe the testing methods done to diagnose heart disease. Reviews the outcomes of the test results. Describes the treatment choices: Medical Management, Angioplasty, or Coronary Bypass Surgery.   Cardiac Rehab from 11/22/2016 in [facility] Cardiac and Pulmonary Rehab  Date  10/11/16  Educator  CE  Instruction Review Code  2- meets goals/outcomes      Cardiac Medications: - Group verbal and written instruction to review commonly prescribed medications for heart disease. Reviews the medication, class of the drug, and side effects. Includes the steps to properly store meds and maintain the prescription regimen.   Cardiac Rehab from 11/22/2016 in [facility] Cardiac and Pulmonary Rehab  Date  10/20/16  Educator  CE  Instruction Review Code  2- meets goals/outcomes      Go Sex-Intimacy & Heart Disease, Get SMART - Goal Setting: - Group verbal and written instruction through game format to discuss heart disease and the return to sexual intimacy. Provides group verbal and written material to discuss and apply goal setting through the application of the S.M.A.R.T. Method.   Cardiac Rehab from 11/22/2016 in [facility] Cardiac and Pulmonary Rehab  Date  10/11/16  Educator  CE  Instruction Review Code  2-  meets goals/outcomes      Other Matters of the Heart: - Provides group verbal, written materials and models to describe Heart Failure,  Angina, Valve Disease, and Diabetes in the realm of heart disease. Includes description of the disease process and treatment options available to the cardiac patient.   Cardiac Rehab from 11/22/2016 in [facility] Cardiac and Pulmonary Rehab  Date  10/04/16  Educator  MA  Instruction Review Code  2- meets goals/outcomes      Exercise & Equipment Safety: - Individual verbal instruction and demonstration of equipment use and safety with use of the equipment.   Cardiac Rehab from 11/22/2016 in [facility] Cardiac and Pulmonary Rehab  Date  09/20/16  Educator  CE  Instruction Review Code  2- meets goals/outcomes      Infection Prevention: - Provides verbal and written material to individual with discussion of infection control including proper hand washing and proper equipment cleaning during exercise session.   Cardiac Rehab from 11/22/2016 in [facility] Cardiac and Pulmonary Rehab  Date  09/20/16  Educator  CE  Instruction Review Code  2- meets goals/outcomes      Falls Prevention: - Provides verbal and written material to individual with discussion of falls prevention and safety.   Cardiac Rehab from 11/22/2016 in [facility] Cardiac and Pulmonary Rehab  Date  09/20/16  Educator  CE  Instruction Review Code  2- meets goals/outcomes      Diabetes: - Individual verbal and written instruction to review signs/symptoms of diabetes, desired ranges of glucose level fasting, after meals and with exercise. Advice that pre and post exercise glucose checks will be done for 3 sessions at entry of program.    Knowledge Questionnaire Score:     Knowledge Questionnaire Score - 09/20/16 1218      Knowledge Questionnaire Score   Pre Score 22/28      Core Components/Risk Factors/Patient Goals at Admission:     Personal Goals and Risk Factors at Admission - 09/20/16 1212      Core Components/Risk Factors/Patient Goals on Admission    Weight Management Yes;Weight Maintenance   Intervention Weight Management:  Develop a combined nutrition and exercise program designed to reach desired caloric intake, while maintaining appropriate intake of nutrient and fiber, sodium and fats, and appropriate energy expenditure required for the weight goal.;Weight Management: Provide education and appropriate resources to help participant work on and attain dietary goals.   Admit Weight 142 lb 6.4 oz (64.6 kg)   Expected Outcomes Weight Maintenance: Understanding of the daily nutrition guidelines, which includes 25-35% calories from fat, 7% or less cal from saturated fats, less than '200mg'$  cholesterol, less than 1.5gm of sodium, & 5 or more servings of fruits and vegetables daily   Sedentary Yes   Intervention Provide advice, education, support and counseling about physical activity/exercise needs.;Develop an individualized exercise prescription for aerobic and resistive training based on initial evaluation findings, risk stratification, comorbidities and participant's personal goals.   Expected Outcomes Achievement of increased cardiorespiratory fitness and enhanced flexibility, muscular endurance and strength shown through measurements of functional capacity and personal statement of participant.   Increase Strength and Stamina Yes   Intervention Provide advice, education, support and counseling about physical activity/exercise needs.;Develop an individualized exercise prescription for aerobic and resistive training based on initial evaluation findings, risk stratification, comorbidities and participant's personal goals.   Expected Outcomes Achievement of increased cardiorespiratory fitness and enhanced flexibility, muscular endurance and strength shown through measurements of functional capacity and personal statement of participant.  Hypertension Yes   Intervention Provide education on lifestyle modifcations including regular physical activity/exercise, weight management, moderate sodium restriction and increased consumption  of fresh fruit, vegetables, and low fat dairy, alcohol moderation, and smoking cessation.;Monitor prescription use compliance.   Expected Outcomes Short Term: Continued assessment and intervention until BP is < 140/27m HG in hypertensive participants. < 130/863mHG in hypertensive participants with diabetes, heart failure or chronic kidney disease.;Long Term: Maintenance of blood pressure at goal levels.   Lipids Yes  Cannot tolerate Statins. HAs drasctically changed eating habits to help control Cholesterol Risk Factor   Intervention Provide education and support for participant on nutrition & aerobic/resistive exercise along with prescribed medications to achieve LDL '70mg'$ , HDL >'40mg'$ .   Expected Outcomes Short Term: Participant states understanding of desired cholesterol values and is compliant with medications prescribed. Participant is following exercise prescription and nutrition guidelines.;Long Term: Cholesterol controlled with medications as prescribed, with individualized exercise RX and with personalized nutrition plan. Value goals: LDL < '70mg'$ , HDL > 40 mg.      Core Components/Risk Factors/Patient Goals Review:      Goals and Risk Factor Review    Row Name 10/21/16 1710 11/10/16 1617           Core Components/Risk Factors/Patient Goals Review   Personal Goals Review Hypertension;Lipids;Increase Strength and Stamina  -      Review Larrys blood pressures have been within normal limits.  He started back to work this week and ws tired first day but second day felt ok.  He will have cholesterol checked in March. 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 Blood pressure is good Lipid blood work will be done soon. March 23      Expected Outcomes  - Cont heart healthy lifestyle.         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 Blood pressure is good Lipid blood work will be done soon. March 23   Expected Outcomes Cont heart healthy lifestyle.      ITP Comments:     ITP Comments    Row Name 09/20/16 1207 09/29/16 0558 10/27/16 0624 11/22/16 1753 11/24/16 0627   ITP Comments Medical REview completed.  Initial ITP created. [facility]. Continue with ITP unless directed changes per Medical Director review.  Has not attended session since Medical REview 30 day review. Continue with ITP unless directed changes per Medical Director review  Med change noted. [place] bring details next visit 30 day review. Continue with ITP unless directed changes per Medical Director review      Comments:

## 2016-11-25 DIAGNOSIS — Z951 Presence of aortocoronary bypass graft: Secondary | ICD-10-CM

## 2016-11-25 NOTE — Progress Notes (Signed)
Daily Session Note  Patient Details  Name: Thomas Fuentes MRN: [id number] Date of Birth: [birth year] Referring Provider:     Cardiac Rehab from 09/20/2016 in [facility] Cardiac [facility]hab  Referring Provider  Kathlyn Sacramento MD      Encounter Date: 11/25/2016  Check In:     Session Check In - 11/25/16 1647      Check-In   Location [facility]-Cardiac & Pulmonary Rehab   Staff Present Earlean Shawl, BS, ACSM CEP, Exercise Physiologist;Carroll Enterkin, RN, Vickki Hearing, BA, ACSM CEP, Exercise Physiologist   Supervising physician immediately available to respond to emergencies See telemetry face sheet for immediately available ER MD   Medication changes reported     Yes   Comments Added crestor   Fall or balance concerns reported    No   Warm-up and Cool-down Performed on first and last piece of equipment   Resistance Training Performed Yes   VAD Patient? No     Pain Assessment   Currently in Pain? No/denies         History  Smoking Status  . Former Smoker  . Years: 40.00  . Types: Cigarettes  . Quit date: 02/06/1999  Smokeless Tobacco  . Never Used    Goals Met:  Independence with exercise equipment Exercise tolerated well No report of cardiac concerns or symptoms Strength training completed today  Goals Unmet:  Not Applicable  Comments: Pt able to follow exercise prescription today without complaint.  Will continue to monitor for progression.    Dr. Emily Filbert is Medical Director for [facility].

## 2016-11-26 DIAGNOSIS — Z951 Presence of aortocoronary bypass graft: Secondary | ICD-10-CM | POA: Diagnosis not present

## 2016-11-26 DIAGNOSIS — Z48812 Encounter for surgical aftercare following surgery on the circulatory system: Secondary | ICD-10-CM | POA: Diagnosis not present

## 2016-11-26 DIAGNOSIS — Z9889 Other specified postprocedural states: Secondary | ICD-10-CM | POA: Diagnosis not present

## 2016-11-29 ENCOUNTER — Encounter: Payer: Medicare Other | Attending: Cardiovascular Disease | Admitting: *Deleted

## 2016-11-29 DIAGNOSIS — Z951 Presence of aortocoronary bypass graft: Secondary | ICD-10-CM | POA: Insufficient documentation

## 2016-11-29 DIAGNOSIS — Z48812 Encounter for surgical aftercare following surgery on the circulatory system: Secondary | ICD-10-CM | POA: Diagnosis not present

## 2016-11-29 DIAGNOSIS — Z9889 Other specified postprocedural states: Secondary | ICD-10-CM | POA: Diagnosis not present

## 2016-11-29 NOTE — Patient Instructions (Signed)
Discharge Instructions  Patient Details  Name: Thomas Fuentes MRN: [id number] Date of Birth: [birth year] Referring Provider:  Wellington Hampshire, MD   Number of Visits: 34  Reason for Discharge:  Patient reached a stable level of exercise. Patient independent in their exercise.  Smoking History:  History  Smoking Status  . Former Smoker  . Years: 40.00  . Types: Cigarettes  . Quit date: 02/06/1999  Smokeless Tobacco  . Never Used    Diagnosis:  S/P CABG x 4  Initial Exercise Prescription:     Initial Exercise Prescription - 09/20/16 1400      Date of Initial Exercise RX and Referring Provider   Date 09/20/16   Referring Provider Kathlyn Sacramento MD     Treadmill   MPH 2.5   Grade 0.5   Minutes 15   METs 3.09     NuStep   Level 2   Minutes 15   METs 2     REL-XR   Level 1   Minutes 15   METs 2     Prescription Details   Frequency (times per week) 3   Duration Progress to 45 minutes of aerobic exercise without signs/symptoms of physical distress     Intensity   THRR 40-80% of Max Heartrate 85-124   Ratings of Perceived Exertion 11-15   Perceived Dyspnea 0-4     Progression   Progression Continue to progress workloads to maintain intensity without signs/symptoms of physical distress.     Resistance Training   Training Prescription Yes   Weight 3 lbs   Reps 10-15      Discharge Exercise Prescription (Final Exercise Prescription Changes):     Exercise Prescription Changes - 11/18/16 1000      Response to Exercise   Blood Pressure (Admit) 128/66   Blood Pressure (Exercise) 148/72   Heart Rate (Admit) 44 bpm   Heart Rate (Exercise) 66 bpm   Heart Rate (Exit) 51 bpm   Rating of Perceived Exertion (Exercise) 13   Symptoms none   Duration Progress to 45 minutes of aerobic exercise without signs/symptoms of physical distress   Intensity THRR unchanged     Progression   Progression Continue to progress workloads to maintain intensity without  signs/symptoms of physical distress.   Average METs 4.1     Resistance Training   Training Prescription Yes   Weight 4   Reps 10-15     Interval Training   Interval Training No      Functional Capacity:     6 Minute Walk    Row Name 09/20/16 1407 11/22/16 1818       6 Minute Walk   Phase Initial Discharge    Distance 1350 feet 1675 feet    Distance % Change  - 24 %    Walk Time 6 minutes 6 minutes    # of Rest Breaks 0 0    MPH 2.56 3.17    METS 2.89 3.82    RPE 9 12    VO2 Peak 10.11 13.3    Symptoms No No    Resting HR 46 bpm 71 bpm    Resting BP 126/64 132/80    Max Ex. HR 97 bpm 110 bpm    Max Ex. BP 132/70 162/80    2 Minute Post BP 130/60  -       Quality of Life:     Quality of Life - 09/20/16 1216      Quality of Life Scores  Health/Function Pre 21 %   Socioeconomic Pre 21 %   Psych/Spiritual Pre 21 %   Family Pre 21 %   GLOBAL Pre 21 %      Personal Goals: Goals established at orientation with interventions provided to work toward goal.     Personal Goals and Risk Factors at Admission - 09/20/16 1212      Core Components/Risk Factors/Patient Goals on Admission    Weight Management Yes;Weight Maintenance   Intervention Weight Management: Develop a combined nutrition and exercise program designed to reach desired caloric intake, while maintaining appropriate intake of nutrient and fiber, sodium and fats, and appropriate energy expenditure required for the weight goal.;Weight Management: Provide education and appropriate resources to help participant work on and attain dietary goals.   Admit Weight 142 lb 6.4 oz (64.6 kg)   Expected Outcomes Weight Maintenance: Understanding of the daily nutrition guidelines, which includes 25-35% calories from fat, 7% or less cal from saturated fats, less than 200mg  cholesterol, less than 1.5gm of sodium, & 5 or more servings of fruits and vegetables daily   Sedentary Yes   Intervention Provide advice,  education, support and counseling about physical activity/exercise needs.;Develop an individualized exercise prescription for aerobic and resistive training based on initial evaluation findings, risk stratification, comorbidities and participant's personal goals.   Expected Outcomes Achievement of increased cardiorespiratory fitness and enhanced flexibility, muscular endurance and strength shown through measurements of functional capacity and personal statement of participant.   Increase Strength and Stamina Yes   Intervention Provide advice, education, support and counseling about physical activity/exercise needs.;Develop an individualized exercise prescription for aerobic and resistive training based on initial evaluation findings, risk stratification, comorbidities and participant's personal goals.   Expected Outcomes Achievement of increased cardiorespiratory fitness and enhanced flexibility, muscular endurance and strength shown through measurements of functional capacity and personal statement of participant.   Hypertension Yes   Intervention Provide education on lifestyle modifcations including regular physical activity/exercise, weight management, moderate sodium restriction and increased consumption of fresh fruit, vegetables, and low fat dairy, alcohol moderation, and smoking cessation.;Monitor prescription use compliance.   Expected Outcomes Short Term: Continued assessment and intervention until BP is < 140/77mm HG in hypertensive participants. < 130/12mm HG in hypertensive participants with diabetes, heart failure or chronic kidney disease.;Long Term: Maintenance of blood pressure at goal levels.   Lipids Yes  Cannot tolerate Statins. HAs drasctically changed eating habits to help control Cholesterol Risk Factor   Intervention Provide education and support for participant on nutrition & aerobic/resistive exercise along with prescribed medications to achieve LDL 70mg , HDL >40mg .   Expected  Outcomes Short Term: Participant states understanding of desired cholesterol values and is compliant with medications prescribed. Participant is following exercise prescription and nutrition guidelines.;Long Term: Cholesterol controlled with medications as prescribed, with individualized exercise RX and with personalized nutrition plan. Value goals: LDL < 70mg , HDL > 40 mg.       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 Blood pressure is good Lipid blood work will be done soon. March 23   Expected Outcomes Cont heart healthy lifestyle.      Nutrition & Weight - Outcomes:     Pre Biometrics - 09/20/16 1410      Pre Biometrics   Height 5' 7.1" (1.704 m)   Weight  142 lb 6.4 oz (64.6 kg)   Waist Circumference 35 inches   Hip Circumference 34.5 inches   Waist to Hip Ratio 1.01 %   BMI (Calculated) 22.3   Single Leg Stand 13.97 seconds         Post Biometrics - 11/22/16 1820       Post  Biometrics   Height 5' 7.1" (1.704 m)   Weight 144 lb (65.3 kg)   Waist Circumference 35 inches   Hip Circumference 34.25 inches   Waist to Hip Ratio 1.02 %   BMI (Calculated) 22.5   Single Leg Stand 14 seconds      Nutrition:     Nutrition Therapy & Goals - 10/11/16 0955      Nutrition Therapy   Diet --  RD appt scheduled      Nutrition Discharge:     Nutrition Assessments - 09/20/16 1209      MEDFICTS Scores   Pre Score 16      Education Questionnaire Score:     Knowledge Questionnaire Score - 09/20/16 1218      Knowledge Questionnaire Score   Pre Score 22/28      Goals reviewed with patient; copy given to patient.

## 2016-11-29 NOTE — Progress Notes (Signed)
Cardiac Individual Treatment Plan  Patient Details  Name: Thomas Fuentes MRN: [id number] Date of Birth: [birth year] Referring Provider:     Cardiac Rehab from 09/20/2016 in [facility] Cardiac and Pulmonary Rehab  Referring Provider  Thomas Sacramento MD      Initial Encounter Date:    Cardiac Rehab from 09/20/2016 in [facility] Cardiac and Pulmonary Rehab  Date  09/20/16  Referring Provider  Thomas Sacramento MD      Visit Diagnosis: S/P CABG x 4  Patient's Home Medications on Admission:  Current Outpatient Prescriptions:  .  aspirin EC 81 MG EC tablet, Take 1 tablet (81 mg total) by mouth daily., Disp: , Rfl:  .  ezetimibe (ZETIA) 10 MG tablet, Take 1 tablet (10 mg total) by mouth daily., Disp: 30 tablet, Rfl: 1 .  gabapentin (NEURONTIN) 600 MG tablet, Take 0.5 tablets (300 mg total) by mouth 3 (three) times daily., Disp: 45 tablet, Rfl: 0 .  levothyroxine (SYNTHROID, LEVOTHROID) 25 MCG tablet, Take 1 tablet (25 mcg total) by mouth daily before breakfast., Disp: 30 tablet, Rfl: 1 .  lisinopril (PRINIVIL,ZESTRIL) 10 MG tablet, Take 1 tablet (10 mg total) by mouth daily., Disp: 30 tablet, Rfl: 3 .  rosuvastatin (CRESTOR) 5 MG tablet, Take 1 tablet (5 mg total) by mouth daily., Disp: 30 tablet, Rfl: 3  Past Medical History: Past Medical History:  Diagnosis Date  . AAA (abdominal aortic aneurysm) ([place])   . Chronic renal disease, stage III   . Coronary artery disease    a. MI 2000 s/p PCI and 2 stent placement to unknown arteries;  LHC 08/09/2016 o-pLAD 30%, mLAD 99%, OM2 50%, OM3 85%, pRCA 99%, mid RCA 60%, RPDA 60%, LVEDP mod elevated. CABG in 07/2016 :  LIMA to LAD, SVG to RCA and SVG to distal left circumflex  . Diabetes mellitus without complication ([place])   . Dyslipidemia   . Hyperlipidemia   . Hypertension   . Hypothyroidism   . Macular degeneration    bilateral  . Metabolic syndrome   . MI (myocardial infarction)   . Peripheral neuropathy ([place])   . Peripheral vascular disease ([place])      Tobacco Use: History  Smoking Status  . Former Smoker  . Years: 40.00  . Types: Cigarettes  . Quit date: 02/06/1999  Smokeless Tobacco  . Never Used    Labs: Recent Review Flowsheet Data    Labs for ITP Cardiac and Pulmonary Rehab Latest Ref Rng & Units 08/11/2016 08/11/2016 08/11/2016 08/12/2016 11/19/2016   Cholestrol 100 - 199 mg/dL - - - - 192   LDLCALC 0 - 99 mg/dL - - - - 116(H)   HDL >39 mg/dL - - - - 36(L)   Trlycerides 0 - 149 mg/dL - - - - 198(H)   Hemoglobin A1c 4.8 - 5.6 % - - - - -   PHART 7.350 - 7.450 7.347(L) - 7.369 - -   PCO2ART 32.0 - 48.0 mmHg 39.5 - 40.4 - -   HCO3 20.0 - 28.0 mmol/L 21.5 - 23.2 - -   TCO2 0 - 100 mmol/L _0 -   ACIDBASEDEF 0.0 - 2.0 mmol/L 4.0(H) - 2.0 - -   O2SAT % 98.0 - 97.0 - -       Exercise Target Goals:    Exercise Program Goal: Individual exercise prescription set with THRR, safety & activity barriers. Participant demonstrates ability to understand and report RPE using BORG scale, to self-measure pulse accurately, and to acknowledge the  importance of the exercise prescription.  Exercise Prescription Goal: Starting with aerobic activity 30 plus minutes a day, 3 days per week for initial exercise prescription. Provide home exercise prescription and guidelines that participant acknowledges understanding prior to discharge.  Activity Barriers & Risk Stratification:     Activity Barriers & Cardiac Risk Stratification - 09/20/16 1218      Activity Barriers & Cardiac Risk Stratification   Activity Barriers Other (comment);Incisional Pain   Comments Abdominal Hernia.   Wil not do sit ups.  Occasional pain from vein graft   Cardiac Risk Stratification High      6 Minute Walk:     6 Minute Walk    Row Name 09/20/16 1407 11/22/16 1818       6 Minute Walk   Phase Initial Discharge    Distance 1350 feet 1675 feet    Distance % Change  - 24 %    Walk Time 6 minutes 6 minutes    # of Rest Breaks 0 0    MPH 2.56  3.17    METS 2.89 3.82    RPE 9 12    VO2 Peak 10.11 13.3    Symptoms No No    Resting HR 46 bpm 71 bpm    Resting BP 126/64 132/80    Max Ex. HR 97 bpm 110 bpm    Max Ex. BP 132/70 162/80    2 Minute Post BP 130/60  -       Oxygen Initial Assessment:   Oxygen Re-Evaluation:   Oxygen Discharge (Final Oxygen Re-Evaluation):   Initial Exercise Prescription:     Initial Exercise Prescription - 09/20/16 1400      Date of Initial Exercise RX and Referring Provider   Date 09/20/16   Referring Provider Thomas Sacramento MD     Treadmill   MPH 2.5   Grade 0.5   Minutes 15   METs 3.09     NuStep   Level 2   Minutes 15   METs 2     REL-XR   Level 1   Minutes 15   METs 2     Prescription Details   Frequency (times per week) 3   Duration Progress to 45 minutes of aerobic exercise without signs/symptoms of physical distress     Intensity   THRR 40-80% of Max Heartrate 85-124   Ratings of Perceived Exertion 11-15   Perceived Dyspnea 0-4     Progression   Progression Continue to progress workloads to maintain intensity without signs/symptoms of physical distress.     Resistance Training   Training Prescription Yes   Weight 3 lbs   Reps 10-15      Perform Capillary Blood Glucose checks as needed.  Exercise Prescription Changes:     Exercise Prescription Changes    Row Name 09/20/16 1200 10/04/16 1500 10/13/16 0900 10/21/16 1200 11/04/16 1100     Response to Exercise   Blood Pressure (Admit) 126/64 126/70  - 134/70 120/66   Blood Pressure (Exercise) 132/70 118/80  - 130/68 132/66   Blood Pressure (Exit) 130/60 94/52  - 120/60 132/66   Heart Rate (Admit) 46 bpm 59 bpm  - 59 bpm 44 bpm   Heart Rate (Exercise) 99 bpm 99 bpm  - 76 bpm 90 bpm   Heart Rate (Exit) 55 bpm 49 bpm  - 54 bpm 51 bpm   Oxygen Saturation (Admit) 99 %  -  -  -  -   Oxygen Saturation (Exercise)  98 %  -  -  -  -   Rating of Perceived Exertion (Exercise)  - 12  - 12 12   Perceived  Dyspnea (Exercise) 9  -  -  -  -   Symptoms none none none none  -   Comments  -  - Home Exercise Guidelines given 10/13/16  -  -   Duration  - Progress to 45 minutes of aerobic exercise without signs/symptoms of physical distress Progress to 45 minutes of aerobic exercise without signs/symptoms of physical distress Progress to 45 minutes of aerobic exercise without signs/symptoms of physical distress Progress to 45 minutes of aerobic exercise without signs/symptoms of physical distress   Intensity  - THRR unchanged THRR unchanged THRR unchanged THRR unchanged     Progression   Progression  - Continue to progress workloads to maintain intensity without signs/symptoms of physical distress. Continue to progress workloads to maintain intensity without signs/symptoms of physical distress. Continue to progress workloads to maintain intensity without signs/symptoms of physical distress. Continue to progress workloads to maintain intensity without signs/symptoms of physical distress.   Average METs  - 3.3 3.3 3.9 4.1     Resistance Training   Training Prescription  - Yes Yes Yes Yes   Weight  - 3 lbs 3 lbs 4 4   Reps  - 10-15 10-15 10-15 10-15     Interval Training   Interval Training  - No No No No     Treadmill   MPH  - 2.5 2.5 2.8 2.8   Grade  - 0.5 0._0 Minutes  - _1 METs  - 3.09 3.09 3.53 3.53     NuStep   Level  - _2 Minutes  - _3 METs  - 2.58 2.58 4.3 4.1     REL-XR   Level  - 1 1  -  -   Minutes  - 15 15  -  -   METs  - 4 4  -  -     Home Exercise Plan   Plans to continue exercise at  -  - Home  walking  -  -   Frequency  -  - Add 2 additional days to program exercise sessions.  -  -     Exercise Review   Progression -  walk test results Yes Yes  -  -   Row Name 11/18/16 1000             Response to Exercise   Blood Pressure (Admit) 128/66       Blood Pressure (Exercise) 148/72       Heart Rate (Admit) 44 bpm       Heart Rate  (Exercise) 66 bpm       Heart Rate (Exit) 51 bpm       Rating of Perceived Exertion (Exercise) 13       Symptoms none       Duration Progress to 45 minutes of aerobic exercise without signs/symptoms of physical distress       Intensity THRR unchanged         Progression   Progression Continue to progress workloads to maintain intensity without signs/symptoms of physical distress.       Average METs 4.1         Resistance Training   Training Prescription Yes       Weight 4  Reps 10-15         Interval Training   Interval Training No          Exercise Comments:     Exercise Comments    Row Name 09/29/16 0900 10/04/16 1516 10/13/16 0924 10/21/16 1256 11/04/16 1135   Exercise Comments First full day of exercise!  Patient was oriented to Fuentes and equipment including functions, settings, policies, and procedures.  Patient's individual exercise prescription and treatment plan were reviewed.  All starting workloads were established based on the results of the 6 minute walk test done at initial orientation visit.  The plan for exercise progression was also introduced and progression will be customized based on patient's performance and goals. Carston is off to a good start with three full days.  He is already up to 4 METs on the XR!  We will continue to monitor his progression. Reviewed home exercise with pt today.  Pt plans to walk at home for exercise.  Reviewed THR, pulse, RPE, sign and symptoms, NTG use, and when to call 911 or MD.  Also discussed weather considerations and indoor options.  Pt voiced understanding. Thomas Fuentes continues to progess well and has added speed and incline to TM and increased overall MET level. Thomas Fuentes continues to tolerate exercise well.   Thomas Fuentes Name 11/18/16 1045 11/22/16 1821 11/29/16 1755       Exercise Comments Thomas Fuentes asked about interval training yesterday.  It is not recommended due to his AAA.  Staff will continue to monitor his progress. Reviewed 6 min walk test  data with patient and discussed his progression. He plans to join the [facility] or hospital fitness center after graduation.  Thomas Fuentes graduated today from cardiac rehab with 36 sessions completed.  Details of the patient's exercise prescription and what He needs to do in order to continue the prescription and progress were discussed with patient.  Patient was given a copy of prescription and goals.  Patient verbalized understanding.  Thomas Fuentes        Exercise Goals and Review:   Exercise Goals Re-Evaluation :   Discharge Exercise Prescription (Final Exercise Prescription Changes):     Exercise Prescription Changes - 11/18/16 1000      Response to Exercise   Blood Pressure (Admit) 128/66   Blood Pressure (Exercise) 148/72   Heart Rate (Admit) 44 bpm   Heart Rate (Exercise) 66 bpm   Heart Rate (Exit) 51 bpm   Rating of Perceived Exertion (Exercise) 13   Symptoms none   Duration Progress to 45 minutes of aerobic exercise without signs/symptoms of physical distress   Intensity THRR unchanged     Progression   Progression Continue to progress workloads to maintain intensity without signs/symptoms of physical distress.   Average METs 4.1     Resistance Training   Training Prescription Yes   Weight 4   Reps 10-15     Interval Training   Interval Training No      Nutrition:  Target Goals: Understanding of nutrition guidelines, daily intake of sodium <1534m, cholesterol <2053m calories 30% from fat and 7% or less from saturated fats, daily to have 5 or more servings of fruits and vegetables.  Biometrics:     Pre Biometrics - 09/20/16 1410      Pre Biometrics   Height 5' 7.1" (1.704 m)   Weight 142 lb 6.4 oz (64.6 kg)   Waist Circumference 35 inches   Hip Circumference 34.5 inches  Waist to Hip Ratio 1.01 %   BMI (Calculated) 22.3   Single Leg Stand 13.97 seconds         Post Biometrics - 11/22/16 1820       Post   Biometrics   Height 5' 7.1" (1.704 m)   Weight 144 lb (65.3 kg)   Waist Circumference 35 inches   Hip Circumference 34.25 inches   Waist to Hip Ratio 1.02 %   BMI (Calculated) 22.5   Single Leg Stand 14 seconds      Nutrition Therapy Plan and Nutrition Goals:     Nutrition Therapy & Goals - 10/11/16 0955      Nutrition Therapy   Diet --  RD appt scheduled      Nutrition Discharge: Rate Your Plate Scores:     Nutrition Assessments - 09/20/16 1209      MEDFICTS Scores   Pre Score 16      Nutrition Goals Re-Evaluation:     Nutrition Goals Re-Evaluation    Row Name 11/10/16 1620             Goals   Comment Effie has increased his protein per the dietician suggestions       Expected Outcome Cont heart healthy lifestyle.          Nutrition Goals Discharge (Final Nutrition Goals Re-Evaluation):     Nutrition Goals Re-Evaluation - 11/10/16 1620      Goals   Comment Caydin has increased his protein per the dietician suggestions   Expected Outcome Cont heart healthy lifestyle.      Psychosocial: Target Goals: Acknowledge presence or absence of significant depression and/or stress, maximize coping skills, provide positive support system. Participant is able to verbalize types and ability to use techniques and skills needed for reducing stress and depression.   Initial Review & Psychosocial Screening:     Initial Psych Review & Screening - 09/20/16 1216      Initial Review   Current issues with --  States NONE     Family Dynamics   Good Support System? Yes  Wife and children that live close by     Barriers   Psychosocial barriers to participate in program There are no identifiable barriers or psychosocial needs.     Screening Interventions   Interventions Encouraged to exercise      Quality of Life Scores:      Quality of Life - 09/20/16 1216      Quality of Life Scores   Health/Function Pre 21 %   Socioeconomic Pre 21 %   Psych/Spiritual  Pre 21 %   Family Pre 21 %   GLOBAL Pre 21 %      PHQ-9: Recent Review Flowsheet Data    Depression screen [employer] 2/9 09/20/2016   Decreased Interest 0   Down, Depressed, Hopeless 0   PHQ - 2 Score 0   Altered sleeping 0   Tired, decreased energy 0   Change in appetite 0   Feeling bad or failure about yourself  0   Trouble concentrating 0   Moving slowly or fidgety/restless 0   Suicidal thoughts 0   PHQ-9 Score 0   Difficult doing work/chores Not difficult at all     Interpretation of Total Score  Total Score Depression Severity:  1-4 = Minimal depression, 5-9 = Mild depression, 10-14 = Moderate depression, 15-19 = Moderately severe depression, 20-27 = Severe depression   Psychosocial Evaluation and Intervention:     Psychosocial Evaluation -  11/24/16 1654      Psychosocial Evaluation & Interventions   Comments Counselor follow up with Thomas Fuentes today reporting increased progress noted on his goals with feeling more stamina and strength. He reports his Dr. Ricard Fuentes a medication last week to increase his heart rate and it is working well - he is feeling more "normal now."  He has enjoyed the education here as well and reports sleeping well and continues to have a positive mood.  He is managing his stress well and coping with whatever comes his way.  He also reports his Dr informed him of his cholesterol levels going up - so he has prescribed a new medication for that as well.  This was somewhat discouraging to Thomas Fuentes since he had worked so hard on his diet to lower his cholesterol.  But his Dr. assured him that it is hereditary and he is doing all he can do to make his life better.  Counselor commended Cadyn for all his hard work and progress made while in this program.     Expected [place] will continue to exercise consistently and follow the medication changes made by his medical providers.     Continue Psychosocial Services  Follow up required by staff      Psychosocial  Re-Evaluation:     Psychosocial Re-Evaluation    [place] Name 10/25/16 1737 11/10/16 1619           Psychosocial Re-Evaluation   Comments Counselor follow up with Thomas Fuentes reporting feeling stronger and enjoying this class more (changed from morning to afternoon class).  He plans to meet with the dietician later this week to help with meal planning since he put himself "on a strict diet" and wants suggestions on what she should and should not eat at this point.  [place] states he continues to sleep well.  Counselor commended Clois for his progress and his commitment to improved health.    -      Expected Outcomes Vinicius will meet with the dietician to determine the best meal plan options for him at this time, and will begin to follow this.  Daequan will continue to exercise consistently to regain his strength and stamina.   Works in am so doing pm class. Dong feels he is doing well.       Continue Psychosocial Services  Follow up required by staff  -         Psychosocial Discharge (Final Psychosocial Re-Evaluation):     Psychosocial Re-Evaluation - 11/10/16 1619      Psychosocial Re-Evaluation   Expected Outcomes Works in am so doing pm class. Ashten feels he is doing well.       Vocational Rehabilitation: Provide vocational rehab assistance to qualifying candidates.   Vocational Rehab Evaluation & Intervention:     Vocational Rehab - 09/20/16 1219      Initial Vocational Rehab Evaluation & Intervention   Assessment shows need for Vocational Rehabilitation No      Education: Education Goals: Education classes will be provided on a weekly basis, covering required topics. Participant will state understanding/return demonstration of topics presented.  Learning Barriers/Preferences:     Learning Barriers/Preferences - 09/20/16 1218      Learning Barriers/Preferences   Learning Barriers None   Learning Preferences None      Education Topics: General Nutrition Guidelines/Fats and  Fiber: -Group instruction provided by verbal, written material, models and posters to present the general guidelines for heart healthy nutrition. Gives an explanation and review  of dietary fats and fiber.   Cardiac Rehab from 11/29/2016 in [facility] Cardiac and Pulmonary Rehab  Date  11/01/16  Educator  PI  Instruction Review Code  2- meets goals/outcomes      Controlling Sodium/Reading Food Labels: -Group verbal and written material supporting the discussion of sodium use in heart healthy nutrition. Review and explanation with models, verbal and written materials for utilization of the food label.   Exercise Physiology & Risk Factors: - Group verbal and written instruction with models to review the exercise physiology of the cardiovascular system and associated critical values. Details cardiovascular disease risk factors and the goals associated with each risk factor.   Cardiac Rehab from 11/29/2016 in [facility] Cardiac and Pulmonary Rehab  Date  11/15/16  Educator  Earlean Shawl, EP  Instruction Review Code  2- meets goals/outcomes      Aerobic Exercise & Resistance Training: - Gives group verbal and written discussion on the health impact of inactivity. On the components of aerobic and resistive training programs and the benefits of this training and how to safely progress through these programs.   Cardiac Rehab from 11/29/2016 in [facility] Cardiac and Pulmonary Rehab  Date  11/17/16  Educator  AS  Instruction Review Code  2- meets goals/outcomes      Flexibility, Balance, General Exercise Guidelines: - Provides group verbal and written instruction on the benefits of flexibility and balance training programs. Provides general exercise guidelines with specific guidelines to those with heart or lung disease. Demonstration and skill practice provided.   Cardiac Rehab from 11/29/2016 in [facility] Cardiac and Pulmonary Rehab  Date  11/22/16  Educator  Earlean Shawl, EP  Instruction Review Code  2- meets  goals/outcomes      Stress Management: - Provides group verbal and written instruction about the health risks of elevated stress, cause of high stress, and healthy ways to reduce stress.   Cardiac Rehab from 11/29/2016 in [facility] Cardiac and Pulmonary Rehab  Date  10/06/16  Educator  [facility]  Instruction Review Code  2- meets goals/outcomes      Depression: - Provides group verbal and written instruction on the correlation between heart/lung disease and depressed mood, treatment options, and the stigmas associated with seeking treatment.   Cardiac Rehab from 11/29/2016 in [facility] Cardiac and Pulmonary Rehab  Date  11/03/16  Educator  [facility]  Instruction Review Code  2- meets goals/outcomes      Anatomy & Physiology of the Heart: - Group verbal and written instruction and models provide basic cardiac anatomy and physiology, with the coronary electrical and arterial systems. Review of: AMI, Angina, Valve disease, Heart Failure, Cardiac Arrhythmia, Pacemakers, and the ICD.   Cardiac Rehab from 11/29/2016 in [facility] Cardiac and Pulmonary Rehab  Date  11/29/16  Educator  CE  Instruction Review Code  2- meets goals/outcomes      Cardiac Procedures: - Group verbal and written instruction and models to describe the testing methods done to diagnose heart disease. Reviews the outcomes of the test results. Describes the treatment choices: Medical Management, Angioplasty, or Coronary Bypass Surgery.   Cardiac Rehab from 11/29/2016 in [facility] Cardiac and Pulmonary Rehab  Date  10/11/16  Educator  CE  Instruction Review Code  2- meets goals/outcomes      Cardiac Medications: - Group verbal and written instruction to review commonly prescribed medications for heart disease. Reviews the medication, class of the drug, and side effects. Includes the steps to properly store meds and maintain the prescription regimen.  Cardiac Rehab from 11/29/2016 in [facility] Cardiac and Pulmonary Rehab  Date  10/20/16  Educator  CE   Instruction Review Code  2- meets goals/outcomes      Go Sex-Intimacy & Heart Disease, Get SMART - Goal Setting: - Group verbal and written instruction through game format to discuss heart disease and the return to sexual intimacy. Provides group verbal and written material to discuss and apply goal setting through the application of the S.M.A.R.T. Method.   Cardiac Rehab from 11/29/2016 in [facility] Cardiac and Pulmonary Rehab  Date  10/11/16  Educator  CE  Instruction Review Code  2- meets goals/outcomes      Other Matters of the Heart: - Provides group verbal, written materials and models to describe Heart Failure, Angina, Valve Disease, and Diabetes in the realm of heart disease. Includes description of the disease process and treatment options available to the cardiac patient.   Cardiac Rehab from 11/29/2016 in [facility] Cardiac and Pulmonary Rehab  Date  11/29/16  Educator  CE  Instruction Review Code  2- meets goals/outcomes      Exercise & Equipment Safety: - Individual verbal instruction and demonstration of equipment use and safety with use of the equipment.   Cardiac Rehab from 11/29/2016 in [facility] Cardiac and Pulmonary Rehab  Date  09/20/16  Educator  CE  Instruction Review Code  2- meets goals/outcomes      Infection Prevention: - Provides verbal and written material to individual with discussion of infection control including proper hand washing and proper equipment cleaning during exercise session.   Cardiac Rehab from 11/29/2016 in [facility] Cardiac and Pulmonary Rehab  Date  09/20/16  Educator  CE  Instruction Review Code  2- meets goals/outcomes      Falls Prevention: - Provides verbal and written material to individual with discussion of falls prevention and safety.   Cardiac Rehab from 11/29/2016 in [facility] Cardiac and Pulmonary Rehab  Date  09/20/16  Educator  CE  Instruction Review Code  2- meets goals/outcomes      Diabetes: - Individual verbal and written instruction to  review signs/symptoms of diabetes, desired ranges of glucose level fasting, after meals and with exercise. Advice that pre and post exercise glucose checks will be done for 3 sessions at entry of program.    Knowledge Questionnaire Score:     Knowledge Questionnaire Score - 09/20/16 1218      Knowledge Questionnaire Score   Pre Score 22/28      Core Components/Risk Factors/Patient Goals at Admission:     Personal Goals and Risk Factors at Admission - 09/20/16 1212      Core Components/Risk Factors/Patient Goals on Admission    Weight Management Yes;Weight Maintenance   Intervention Weight Management: Develop a combined nutrition and exercise program designed to reach desired caloric intake, while maintaining appropriate intake of nutrient and fiber, sodium and fats, and appropriate energy expenditure required for the weight goal.;Weight Management: Provide education and appropriate resources to help participant work on and attain dietary goals.   Admit Weight 142 lb 6.4 oz (64.6 kg)   Expected Outcomes Weight Maintenance: Understanding of the daily nutrition guidelines, which includes 25-35% calories from fat, 7% or less cal from saturated fats, less than 227m cholesterol, less than 1.5gm of sodium, & 5 or more servings of fruits and vegetables daily   Sedentary Yes   Intervention Provide advice, education, support and counseling about physical activity/exercise needs.;Develop an individualized exercise prescription for aerobic and resistive training based on initial  evaluation findings, risk stratification, comorbidities and participant's personal goals.   Expected Outcomes Achievement of increased cardiorespiratory fitness and enhanced flexibility, muscular endurance and strength shown through measurements of functional capacity and personal statement of participant.   Increase Strength and Stamina Yes   Intervention Provide advice, education, support and counseling about physical  activity/exercise needs.;Develop an individualized exercise prescription for aerobic and resistive training based on initial evaluation findings, risk stratification, comorbidities and participant's personal goals.   Expected Outcomes Achievement of increased cardiorespiratory fitness and enhanced flexibility, muscular endurance and strength shown through measurements of functional capacity and personal statement of participant.   Hypertension Yes   Intervention Provide education on lifestyle modifcations including regular physical activity/exercise, weight management, moderate sodium restriction and increased consumption of fresh fruit, vegetables, and low fat dairy, alcohol moderation, and smoking cessation.;Monitor prescription use compliance.   Expected Outcomes Short Term: Continued assessment and intervention until BP is < 140/16m HG in hypertensive participants. < 130/88mHG in hypertensive participants with diabetes, heart failure or chronic kidney disease.;Long Term: Maintenance of blood pressure at goal levels.   Lipids Yes  Cannot tolerate Statins. HAs drasctically changed eating habits to help control Cholesterol Risk Factor   Intervention Provide education and support for participant on nutrition & aerobic/resistive exercise along with prescribed medications to achieve LDL <7053mHDL >52m36m Expected Outcomes Short Term: Participant states understanding of desired cholesterol values and is compliant with medications prescribed. Participant is following exercise prescription and nutrition guidelines.;Long Term: Cholesterol controlled with medications as prescribed, with individualized exercise RX and with personalized nutrition plan. Value goals: LDL < 70mg79mL > 40 mg.      Core Components/Risk Factors/Patient Goals Review:      Goals and Risk Factor Review    Row Name 10/21/16 1710 11/10/16 1617           Core Components/Risk Factors/Patient Goals Review   Personal Goals  Review Hypertension;Lipids;Increase Strength and Stamina  -      Review Larrys blood pressures have been within normal limits.  He started back to work this week and ws tired first day but second day felt ok.  He will have cholesterol checked in March. 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 Blood pressure is good Lipid blood work will be done soon. March 23      Expected Outcomes  - Cont heart healthy lifestyle.         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 Blood pressure is good Lipid blood work will be done soon. March 23   Expected Outcomes Cont heart healthy lifestyle.      ITP Comments:     ITP Comments    Row Name 09/20/16 1207 09/29/16 0558 10/27/16 0624 11/22/16 1753 11/24/16 0627   ITP Comments Medical REview completed.  Initial ITP created. Documnetation of [facility] 30 day review. Continue with ITP unless directed changes per Medical Director review.  Has not attended session since Medical REview 30 day review. Continue with ITP unless directed changes per Medical Director review  Med change noted. LarryKyshon bring details next visit 30 day review. Continue with ITP unless directed changes per Medical Director review  [place] Name 11/24/16 1710           ITP Comments Amiodorane, Metoprolol stopped by MD for heart rate of 39. Tejuan reported that the MD increased his lisinopril.           Comments: graduated today/Discharge

## 2016-11-29 NOTE — Progress Notes (Signed)
Daily Session Note  Patient Details  Name: Thomas Fuentes MRN: [id number] Date of Birth: 05/11/1941 Referring Provider:     Cardiac Rehab from 09/20/2016 in [facility] Cardiac and Pulmonary Rehab  Referring Provider  Kathlyn Sacramento MD      Encounter Date: 11/29/2016  Check In:     Session Check In - 11/29/16 1618      Check-In   Location [facility]-Cardiac & Pulmonary Rehab   Staff Present Gerlene Burdock, RN, BSN;Susanne Bice, RN, BSN, Laveda Norman, BS, ACSM CEP, Exercise Physiologist   Supervising physician immediately available to respond to emergencies See telemetry face sheet for immediately available ER MD   Medication changes reported     No   Fall or balance concerns reported    No   Warm-up and Cool-down Performed on first and last piece of equipment   Resistance Training Performed Yes   VAD Patient? No     Pain Assessment   Currently in Pain? No/denies   Multiple Pain Sites No         History  Smoking Status  . Former Smoker  . Years: 40.00  . Types: Cigarettes  . Quit date: 02/06/1999  Smokeless Tobacco  . Never Used    Goals Met:  Independence with exercise equipment Exercise tolerated well Personal goals reviewed Strength training completed today  Goals Unmet:  Not Applicable  Comments:  Hershel graduated today from cardiac rehab with 36 sessions completed.  Details of the patient's exercise prescription and what He needs to do in order to continue the prescription and progress were discussed with patient.  Patient was given a copy of prescription and goals.  Patient verbalized understanding.  Brayan plans to continue to exercise by home exercise/community gym.   Dr. Emily Filbert is Medical Director for [facility].

## 2016-11-30 NOTE — Progress Notes (Signed)
Discharge Summary  Patient Details  Name: Thomas Fuentes MRN: [id number] Date of Birth: [birth year] Referring Provider:     Cardiac Rehab from 09/20/2016 in [facility] Cardiac and Pulmonary Rehab  Referring Provider  Kathlyn Sacramento MD       Number of Visits: 36  Reason for Discharge:  Patient reached a stable level of exercise. Patient independent in their exercise.  Smoking History:  History  Smoking Status  . Former Smoker  . Years: 40.00  . Types: Cigarettes  . Quit date: 02/06/1999  Smokeless Tobacco  . Never Used    Diagnosis:  S/P CABG x 4  ADL UCSD:   Initial Exercise Prescription:     Initial Exercise Prescription - 09/20/16 1400      Date of Initial Exercise RX and Referring Provider   Date 09/20/16   Referring Provider Kathlyn Sacramento MD     Treadmill   MPH 2.5   Grade 0.5   Minutes 15   METs 3.09     NuStep   Level 2   Minutes 15   METs 2     REL-XR   Level 1   Minutes 15   METs 2     Prescription Details   Frequency (times per week) 3   Duration Progress to 45 minutes of aerobic exercise without signs/symptoms of physical distress     Intensity   THRR 40-80% of Max Heartrate 85-124   Ratings of Perceived Exertion 11-15   Perceived Dyspnea 0-4     Progression   Progression Continue to progress workloads to maintain intensity without signs/symptoms of physical distress.     Resistance Training   Training Prescription Yes   Weight 3 lbs   Reps 10-15      Discharge Exercise Prescription (Final Exercise Prescription Changes):     Exercise Prescription Changes - 11/18/16 1000      Response to Exercise   Blood Pressure (Admit) 128/66   Blood Pressure (Exercise) 148/72   Heart Rate (Admit) 44 bpm   Heart Rate (Exercise) 66 bpm   Heart Rate (Exit) 51 bpm   Rating of Perceived Exertion (Exercise) 13   Symptoms none   Duration Progress to 45 minutes of aerobic exercise without signs/symptoms of physical distress   Intensity THRR  unchanged     Progression   Progression Continue to progress workloads to maintain intensity without signs/symptoms of physical distress.   Average METs 4.1     Resistance Training   Training Prescription Yes   Weight 4   Reps 10-15     Interval Training   Interval Training No      Functional Capacity:     6 Minute Walk    Row Name 09/20/16 1407 11/22/16 1818       6 Minute Walk   Phase Initial Discharge    Distance 1350 feet 1675 feet    Distance % Change  - 24 %    Walk Time 6 minutes 6 minutes    # of Rest Breaks 0 0    MPH 2.56 3.17    METS 2.89 3.82    RPE 9 12    VO2 Peak 10.11 13.3    Symptoms No No    Resting HR 46 bpm 71 bpm    Resting BP 126/64 132/80    Max Ex. HR 97 bpm 110 bpm    Max Ex. BP 132/70 162/80    2 Minute Post BP 130/60  -  Psychological, QOL, Others - Outcomes: PHQ 2/9: Depression screen PHQ 2/9 09/20/2016  Decreased Interest 0  Down, Depressed, Hopeless 0  PHQ - 2 Score 0  Altered sleeping 0  Tired, decreased energy 0  Change in appetite 0  Feeling bad or failure about yourself  0  Trouble concentrating 0  Moving slowly or fidgety/restless 0  Suicidal thoughts 0  PHQ-9 Score 0  Difficult doing work/chores Not difficult at all    Quality of Life:     Quality of Life - 09/20/16 1216      Quality of Life Scores   Health/Function Pre 21 %   Socioeconomic Pre 21 %   Psych/Spiritual Pre 21 %   Family Pre 21 %   GLOBAL Pre 21 %      Personal Goals: Goals established at orientation with interventions provided to work toward goal.     Personal Goals and Risk Factors at Admission - 09/20/16 1212      Core Components/Risk Factors/Patient Goals on Admission    Weight Management Yes;Weight Maintenance   Intervention Weight Management: Develop a combined nutrition and exercise program designed to reach desired caloric intake, while maintaining appropriate intake of nutrient and fiber, sodium and fats, and appropriate  energy expenditure required for the weight goal.;Weight Management: Provide education and appropriate resources to help participant work on and attain dietary goals.   Admit Weight 142 lb 6.4 oz (64.6 kg)   Expected Outcomes Weight Maintenance: Understanding of the daily nutrition guidelines, which includes 25-35% calories from fat, 7% or less cal from saturated fats, less than 200mg  cholesterol, less than 1.5gm of sodium, & 5 or more servings of fruits and vegetables daily   Sedentary Yes   Intervention Provide advice, education, support and counseling about physical activity/exercise needs.;Develop an individualized exercise prescription for aerobic and resistive training based on initial evaluation findings, risk stratification, comorbidities and participant's personal goals.   Expected Outcomes Achievement of increased cardiorespiratory fitness and enhanced flexibility, muscular endurance and strength shown through measurements of functional capacity and personal statement of participant.   Increase Strength and Stamina Yes   Intervention Provide advice, education, support and counseling about physical activity/exercise needs.;Develop an individualized exercise prescription for aerobic and resistive training based on initial evaluation findings, risk stratification, comorbidities and participant's personal goals.   Expected Outcomes Achievement of increased cardiorespiratory fitness and enhanced flexibility, muscular endurance and strength shown through measurements of functional capacity and personal statement of participant.   Hypertension Yes   Intervention Provide education on lifestyle modifcations including regular physical activity/exercise, weight management, moderate sodium restriction and increased consumption of fresh fruit, vegetables, and low fat dairy, alcohol moderation, and smoking cessation.;Monitor prescription use compliance.   Expected Outcomes Short Term: Continued assessment and  intervention until BP is < 140/74mm HG in hypertensive participants. < 130/33mm HG in hypertensive participants with diabetes, heart failure or chronic kidney disease.;Long Term: Maintenance of blood pressure at goal levels.   Lipids Yes  Cannot tolerate Statins. HAs drasctically changed eating habits to help control Cholesterol Risk Factor   Intervention Provide education and support for participant on nutrition & aerobic/resistive exercise along with prescribed medications to achieve LDL 70mg , HDL >40mg .   Expected Outcomes Short Term: Participant states understanding of desired cholesterol values and is compliant with medications prescribed. Participant is following exercise prescription and nutrition guidelines.;Long Term: Cholesterol controlled with medications as prescribed, with individualized exercise RX and with personalized nutrition plan. Value goals: LDL < 70mg , HDL > 40 mg.  Personal Goals Discharge:     Goals and Risk Factor Review    Row Name 10/21/16 1710 11/10/16 1617           Core Components/Risk Factors/Patient Goals Review   Personal Goals Review Hypertension;Lipids;Increase Strength and Stamina  -      Review Larrys blood pressures have been within normal limits.  He started back to work this week and ws tired first day but second day felt ok.  He will have cholesterol checked in March. 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 Blood pressure is good Lipid blood work will be done soon. March 23      Expected Outcomes  - Cont heart healthy lifestyle.         Nutrition & Weight - Outcomes:     Pre Biometrics - 09/20/16 1410      Pre Biometrics   Height 5' 7.1" (1.704 m)   Weight 142 lb 6.4 oz (64.6 kg)   Waist Circumference 35 inches   Hip Circumference 34.5 inches   Waist to Hip Ratio 1.01 %   BMI (Calculated) 22.3   Single Leg Stand 13.97 seconds         Post Biometrics - 11/22/16 1820       Post  Biometrics   Height  5' 7.1" (1.704 m)   Weight 144 lb (65.3 kg)   Waist Circumference 35 inches   Hip Circumference 34.25 inches   Waist to Hip Ratio 1.02 %   BMI (Calculated) 22.5   Single Leg Stand 14 seconds      Nutrition:     Nutrition Therapy & Goals - 10/11/16 0955      Nutrition Therapy   Diet --  RD appt scheduled      Nutrition Discharge:     Nutrition Assessments - 09/20/16 1209      MEDFICTS Scores   Pre Score 16      Education Questionnaire Score:     Knowledge Questionnaire Score - 09/20/16 1218      Knowledge Questionnaire Score   Pre Score 22/28      Goals reviewed with patient; copy given to patient.

## 2016-12-08 DIAGNOSIS — H353222 Exudative age-related macular degeneration, left eye, with inactive choroidal neovascularization: Secondary | ICD-10-CM | POA: Diagnosis not present

## 2016-12-15 DIAGNOSIS — H353222 Exudative age-related macular degeneration, left eye, with inactive choroidal neovascularization: Secondary | ICD-10-CM | POA: Diagnosis not present

## 2016-12-22 IMAGING — CR DG CHEST 1V PORT
1 series · 1 of 1 positions shown · non-contrast
Comparison: 08/13/2016

CLINICAL DATA: Chest tube

EXAM:
PORTABLE CHEST 1 VIEW

[AP]
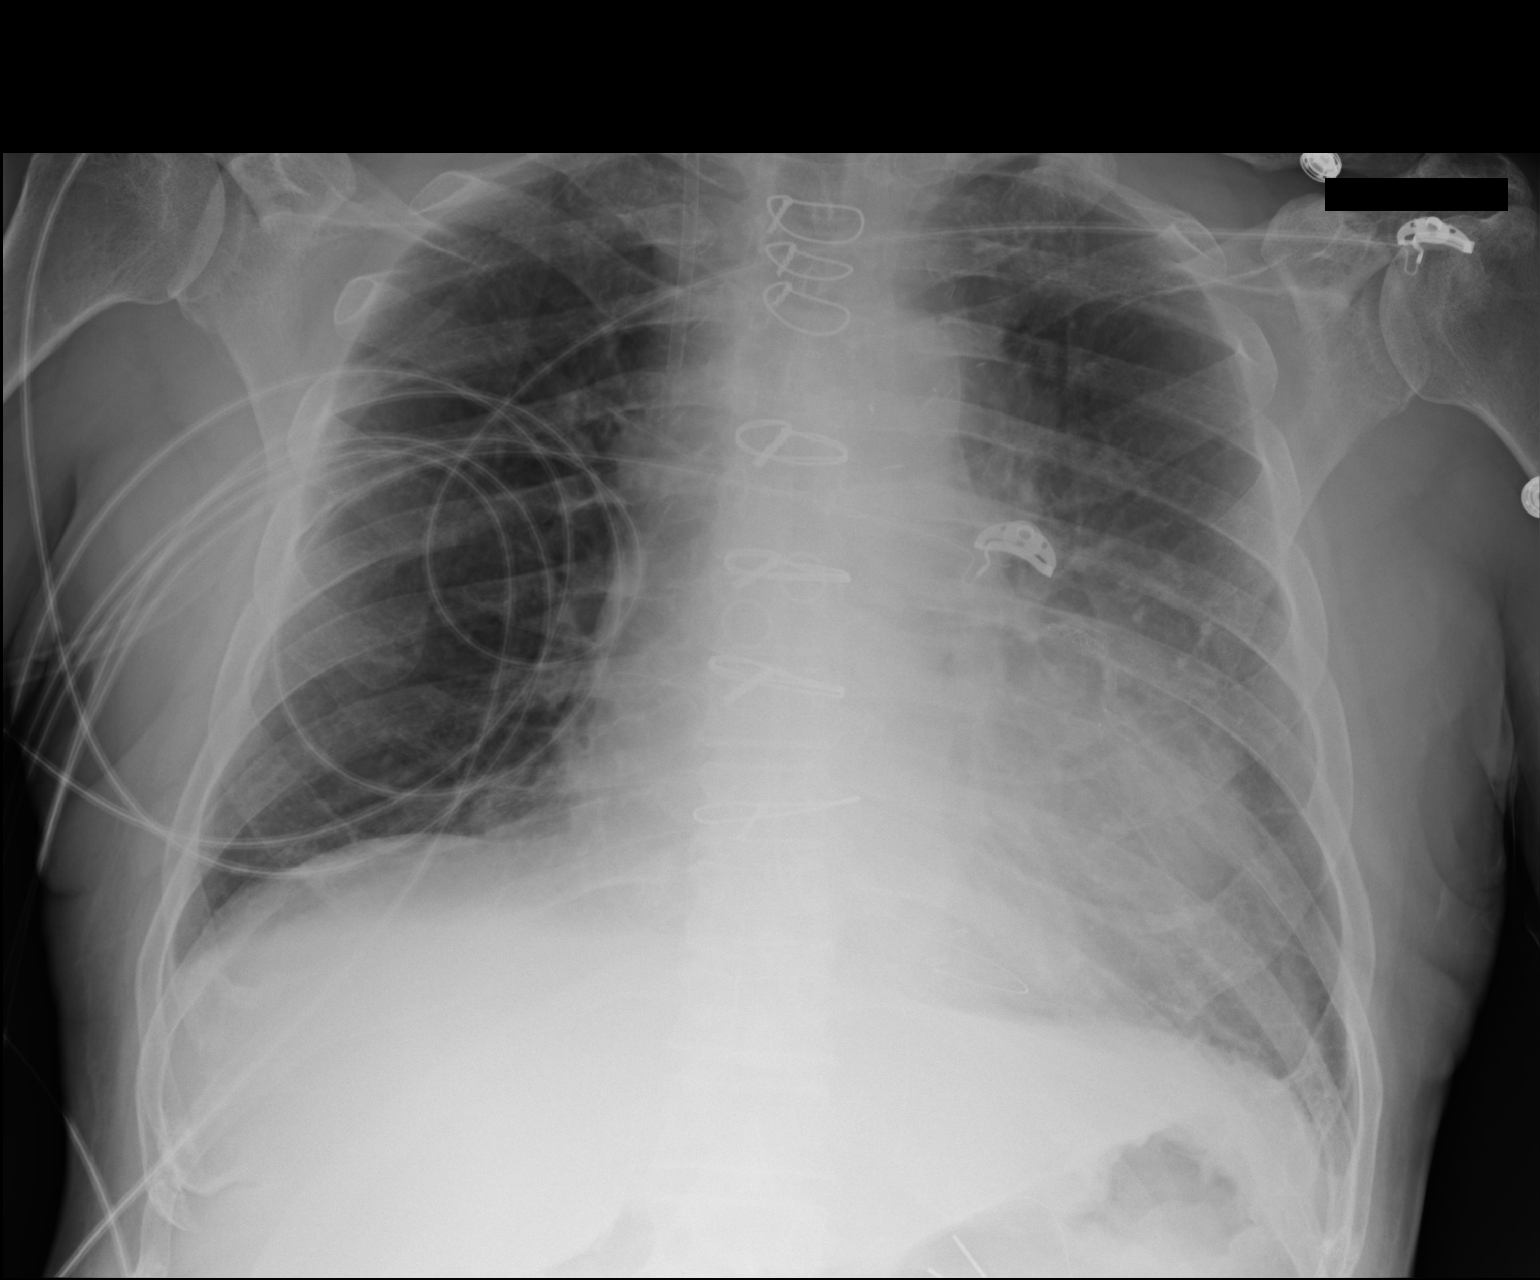

[1 of 1 positions shown; findings below may reference images not displayed]

FINDINGS: Interval removal of left chest tube. No pneumothorax. There is
cardiomegaly with left base atelectasis. Right lung is clear. No
effusions.
IMPRESSION: Interval removal of left chest tube without pneumothorax. Left base
atelectasis.

## 2017-01-05 ENCOUNTER — Other Ambulatory Visit
Admission: RE | Admit: 2017-01-05 | Discharge: 2017-01-05 | Disposition: A | Discharge status: Home / Self Care | Payer: Medicare Other | Source: Ambulatory Visit | Attending: Cardiovascular Disease | Admitting: Cardiovascular Disease

## 2017-01-05 DIAGNOSIS — R946 Abnormal results of thyroid function studies: Secondary | ICD-10-CM | POA: Diagnosis not present

## 2017-01-05 DIAGNOSIS — E785 Hyperlipidemia, unspecified: Secondary | ICD-10-CM | POA: Insufficient documentation

## 2017-01-05 DIAGNOSIS — R7989 Other specified abnormal findings of blood chemistry: Secondary | ICD-10-CM

## 2017-01-05 LAB — HEPATIC FUNCTION PANEL
ALBUMIN: 4.1 g/dL (ref 3.5–5.0)
ALK PHOS: 36 U/L — AB (ref 38–126)
ALT: 12 U/L — ABNORMAL LOW (ref 17–63)
AST: 20 U/L (ref 15–41)
BILIRUBIN DIRECT: 0.1 mg/dL (ref 0.1–0.5)
BILIRUBIN INDIRECT: 0.7 mg/dL (ref 0.3–0.9)
BILIRUBIN TOTAL: 0.8 mg/dL (ref 0.3–1.2)
Total Protein: 7.5 g/dL (ref 6.5–8.1)

## 2017-01-05 LAB — LIPID PANEL
CHOL/HDL RATIO: 2.9 ratio
CHOLESTEROL: 111 mg/dL (ref 0–200)
HDL: 38 mg/dL — AB (ref >40)
LDL Cholesterol: 53 mg/dL (ref 0–99)
TRIGLYCERIDES: 102 mg/dL (ref <150)
VLDL: 20 mg/dL (ref 0–40)

## 2017-01-05 LAB — TSH: TSH: 3.274 u[IU]/mL (ref 0.350–4.500)

## 2017-02-09 DIAGNOSIS — H353222 Exudative age-related macular degeneration, left eye, with inactive choroidal neovascularization: Secondary | ICD-10-CM | POA: Diagnosis not present

## 2017-02-21 ENCOUNTER — Ambulatory Visit (INDEPENDENT_AMBULATORY_CARE_PROVIDER_SITE_OTHER): Payer: Medicare Other | Admitting: Cardiovascular Disease

## 2017-02-21 ENCOUNTER — Encounter: Payer: Self-pay | Admitting: Cardiovascular Disease

## 2017-02-21 VITALS — BP 118/72 | HR 49 | Ht 67.5 in | Wt 149.0 lb

## 2017-02-21 DIAGNOSIS — I714 Abdominal aortic aneurysm, without rupture, unspecified: Secondary | ICD-10-CM

## 2017-02-21 DIAGNOSIS — I2510 Atherosclerotic heart disease of native coronary artery without angina pectoris: Secondary | ICD-10-CM

## 2017-02-21 DIAGNOSIS — E785 Hyperlipidemia, unspecified: Secondary | ICD-10-CM | POA: Diagnosis not present

## 2017-02-21 DIAGNOSIS — I10 Essential (primary) hypertension: Secondary | ICD-10-CM | POA: Diagnosis not present

## 2017-02-21 MED ORDER — LISINOPRIL 5 MG PO TABS: 5.0000 mg | ORAL_TABLET | Freq: Every day | ORAL | 3 refills | Status: DC

## 2017-02-21 NOTE — Progress Notes (Signed)
Cardiology Office Note   Date:  02/21/2017   ID:  Thomas Fuentes, DOB 28-Jul-1941, MRN [id number]  PCP:  Thomas Redwood, MD  Cardiologist:   Thomas Sacramento, MD   Chief Complaint  Patient presents with  . other    3 month follow up. Meds reviewed by the pt. verbally. Pt. c/o decreased BP today.       History of Present Illness: Thomas Fuentes is a 76 y.o. male who presents for a follow-up visit regarding coronary artery disease s/p CABG in 07/2016 after NSTEMI.   He has chronic medical conditions that include hypertension, hyperlipidemia (intolerant to statins), type 2 diabetes and chronic kidney disease.   During last visit, I discontinued amiodarone as he was bradycardic. He was on amiodarone for postoperative atrial fibrillation. Lisinopril was increased to 10 mg once daily due to elevated blood pressure. He was started on small dose rosuvastatin and he has been tolerating this with significant improvement in lipid profile. He reports no chest pain or shortness of breath. He had recent fatigue and mild dizziness and noted that his blood pressure has been running low with occasional bradycardia.   Past Medical History:  Diagnosis Date  . AAA (abdominal aortic aneurysm) ([place])   . Chronic renal disease, stage III   . Coronary artery disease    a. MI 2000 s/p PCI and 2 stent placement to unknown arteries;  LHC 08/09/2016 o-pLAD 30%, mLAD 99%, OM2 50%, OM3 85%, pRCA 99%, mid RCA 60%, RPDA 60%, LVEDP mod elevated. CABG in 07/2016 :  LIMA to LAD, SVG to RCA and SVG to distal left circumflex  . Diabetes mellitus without complication (Thomas Fuentes)   . Dyslipidemia   . Hyperlipidemia   . Hypertension   . Hypothyroidism   . Macular degeneration    bilateral  . Metabolic syndrome   . MI (myocardial infarction) ([place])   . Peripheral neuropathy   . Peripheral vascular disease [facility])     Past Surgical History:  Procedure Laterality Date  . CARDIAC CATHETERIZATION  2000   [place] x2 stents  .  CARDIAC CATHETERIZATION N/A 08/09/2016   Procedure: Left Heart Cath and Coronary Angiography;  Surgeon: Thomas Hampshire, MD;  Location: [place] [facility];  Service: Cardiovascular;  Laterality: N/A;  . CORONARY ARTERY BYPASS GRAFT N/A 08/11/2016   Procedure: CORONARY ARTERY BYPASS GRAFTING on pump using left internal mammary artery to left anterior descending coronary artery , portion of right greater saphenous vein to right coronary artery, portion of right greater saphenous vein graft to distal circumflex.;  Surgeon: Thomas Isaac, MD;  Location: [facility];  Service: Open Heart Surgery;  Laterality: N/A;  . CORONARY STENT PLACEMENT    . ENDOVEIN HARVEST OF GREATER SAPHENOUS VEIN Right 08/11/2016   Procedure: ENDOVEIN HARVEST OF GREATER SAPHENOUS VEIN;  Surgeon: Thomas Isaac, MD;  Location: [place];  Service: Open Heart Surgery;  Laterality: Right;  . TEE WITHOUT CARDIOVERSION N/A 08/11/2016   Procedure: TRANSESOPHAGEAL ECHOCARDIOGRAM (TEE);  Surgeon: Thomas Isaac, MD;  Location: [place];  Service: Open Heart Surgery;  Laterality: N/A;     Current Outpatient Prescriptions  Medication Sig Dispense Refill  . aspirin EC 81 MG EC tablet Take 1 tablet (81 mg total) by mouth daily.    Marland Kitchen ezetimibe (ZETIA) 10 MG tablet Take 1 tablet (10 mg total) by mouth daily. 30 tablet 1  . fenofibrate 160 MG tablet Take 160 mg by mouth.     Marland Kitchen  gabapentin (NEURONTIN) 600 MG tablet Take 0.5 tablets (300 mg total) by mouth 3 (three) times daily. (Patient taking differently: Take 600 mg by mouth 3 (three) times daily. ) 45 tablet 0  . levothyroxine (SYNTHROID, LEVOTHROID) 25 MCG tablet Take 1 tablet (25 mcg total) by mouth daily before breakfast. 30 tablet 1  . rosuvastatin (CRESTOR) 5 MG tablet Take 1 tablet (5 mg total) by mouth daily. 30 tablet 3  . lisinopril (PRINIVIL,ZESTRIL) 5 MG tablet Take 1 tablet (5 mg total) by mouth daily. 90 tablet 3   No current facility-administered medications for this visit.      Allergies:   Lidocaine and Statins    Social History:  The patient  reports that he quit smoking about 18 years ago. His smoking use included Cigarettes. He quit after 40.00 years of use. He has never used smokeless tobacco. He reports that he drinks alcohol. He reports that he does not use drugs.   Family History:  The patient's family history includes Hypertension in his mother.    ROS:  Please see the history of present illness.   Otherwise, review of systems are positive for none.   All other systems are reviewed and negative.    PHYSICAL EXAM: VS:  BP 118/72 (BP Location: Left Arm, Patient Position: Sitting, Cuff Size: Normal)   Pulse (!) 49   Ht 5' 7.5" (1.715 m)   Wt 149 lb (67.6 kg)   BMI 22.99 kg/m  , BMI Body mass index is 22.99 kg/m. GEN: Well nourished, well developed, in no acute distress  HEENT: normal  Neck: no JVD, carotid bruits, or masses Cardiac: RRR with severe bradycardia; no murmurs, rubs, or gallops,no edema  Respiratory:  clear to auscultation bilaterally, normal work of breathing GI: soft, nontender, nondistended, + BS MS: no deformity or atrophy  Skin: warm and dry, no rash Neuro:  Strength and sensation are intact Psych: euthymic mood, full affect   EKG:  EKG is  ordered today. EKG showed sinus bradycardia with sinus arrhythmia and first-degree AV block. Heart rate is 49 bpm.   Recent Labs: 08/14/2016: Magnesium 1.8 08/15/2016: Hemoglobin 8.5; Platelets 210 11/19/2016: BUN 23; Creatinine, Ser 1.12; Potassium 4.8; Sodium 143 01/05/2017: ALT 12; TSH 3.274    Lipid Panel    Component Value Date/Time   CHOL 111 01/05/2017 1158   CHOL 192 11/19/2016 0841   TRIG 102 01/05/2017 1158   HDL 38 (L) 01/05/2017 1158   HDL 36 (L) 11/19/2016 0841   CHOLHDL 2.9 01/05/2017 1158   VLDL 20 01/05/2017 1158   LDLCALC 53 01/05/2017 1158   LDLCALC 116 (H) 11/19/2016 0841      Wt Readings from Last 3 Encounters:  02/21/17 149 lb (67.6 kg)  11/22/16  144 lb (65.3 kg)  11/19/16 142 lb 8 oz (64.6 kg)        ASSESSMENT AND PLAN:  1.  Coronary artery disease involving native coronary arteries without angina:  He is doing well with no anginal symptoms. Continue medical therapy .  2. Postoperative atrial fibrillation:  No evidence of recurrent arrhythmia off amiodarone.  3. Abdominal aortic aneurysm:  Ultrasound in February showed stable size at 3.3 cm. Repeat study in 2 years.  4. Hyperlipidemia: He is now tolerating small dose rosuvastatin with Zetia and fenofibrate. Most recent lipid profile in May showed excellent response with an LDL of 53 and triglyceride of 102. Continue same medications.  5. Essential hypertension: Blood pressure  has been running low. I decreased  lisinopril back to 5 mg once daily.  6. Mildly dilated aortic root at 4.2 cm. This can be followed by echocardiogram alternating with CT scans.  Disposition:   FU with me in 6 months  Signed,  Thomas Sacramento, MD  02/21/2017 5:09 PM    [place]

## 2017-02-21 NOTE — Patient Instructions (Signed)
Medication Instructions:  Your physician has recommended you make the following change in your medication:  DECREASE lisinopril to 5mg  once daily   Labwork: none  Testing/Procedures: none  Follow-Up: Your physician wants you to follow-up in: 6 months with Dr. Fletcher Anon.  You will receive a reminder letter in the mail two months in advance. If you don't receive a letter, please call our office to schedule the follow-up appointment.   Any Other Special Instructions Will Be Listed Below (If Applicable).     If you need a refill on your cardiac medications before your next appointment, please call your pharmacy.

## 2017-03-19 ENCOUNTER — Other Ambulatory Visit: Payer: Self-pay | Admitting: Cardiovascular Disease

## 2017-03-19 DIAGNOSIS — E785 Hyperlipidemia, unspecified: Secondary | ICD-10-CM

## 2017-03-23 DIAGNOSIS — L821 Other seborrheic keratosis: Secondary | ICD-10-CM | POA: Diagnosis not present

## 2017-03-23 DIAGNOSIS — Z85828 Personal history of other malignant neoplasm of skin: Secondary | ICD-10-CM | POA: Diagnosis not present

## 2017-03-23 DIAGNOSIS — D225 Melanocytic nevi of trunk: Secondary | ICD-10-CM | POA: Diagnosis not present

## 2017-03-23 DIAGNOSIS — D1800 Hemangioma unspecified site: Secondary | ICD-10-CM | POA: Diagnosis not present

## 2017-03-23 DIAGNOSIS — L814 Other melanin hyperpigmentation: Secondary | ICD-10-CM | POA: Diagnosis not present

## 2017-03-23 DIAGNOSIS — L570 Actinic keratosis: Secondary | ICD-10-CM | POA: Diagnosis not present

## 2017-04-07 DIAGNOSIS — H353222 Exudative age-related macular degeneration, left eye, with inactive choroidal neovascularization: Secondary | ICD-10-CM | POA: Diagnosis not present

## 2017-04-21 DIAGNOSIS — H353212 Exudative age-related macular degeneration, right eye, with inactive choroidal neovascularization: Secondary | ICD-10-CM | POA: Diagnosis not present

## 2017-06-02 DIAGNOSIS — H353222 Exudative age-related macular degeneration, left eye, with inactive choroidal neovascularization: Secondary | ICD-10-CM | POA: Diagnosis not present

## 2017-07-14 DIAGNOSIS — H353212 Exudative age-related macular degeneration, right eye, with inactive choroidal neovascularization: Secondary | ICD-10-CM | POA: Diagnosis not present

## 2017-07-28 DIAGNOSIS — H353222 Exudative age-related macular degeneration, left eye, with inactive choroidal neovascularization: Secondary | ICD-10-CM | POA: Diagnosis not present

## 2017-09-26 DIAGNOSIS — R82998 Other abnormal findings in urine: Secondary | ICD-10-CM | POA: Diagnosis not present

## 2017-09-26 DIAGNOSIS — Z125 Encounter for screening for malignant neoplasm of prostate: Secondary | ICD-10-CM | POA: Diagnosis not present

## 2017-09-26 DIAGNOSIS — E038 Other specified hypothyroidism: Secondary | ICD-10-CM | POA: Diagnosis not present

## 2017-09-26 DIAGNOSIS — E7849 Other hyperlipidemia: Secondary | ICD-10-CM | POA: Diagnosis not present

## 2017-09-26 DIAGNOSIS — I10 Essential (primary) hypertension: Secondary | ICD-10-CM | POA: Diagnosis not present

## 2017-09-29 DIAGNOSIS — H353222 Exudative age-related macular degeneration, left eye, with inactive choroidal neovascularization: Secondary | ICD-10-CM | POA: Diagnosis not present

## 2017-10-03 DIAGNOSIS — E7849 Other hyperlipidemia: Secondary | ICD-10-CM | POA: Diagnosis not present

## 2017-10-03 DIAGNOSIS — I10 Essential (primary) hypertension: Secondary | ICD-10-CM | POA: Diagnosis not present

## 2017-10-03 DIAGNOSIS — I480 Paroxysmal atrial fibrillation: Secondary | ICD-10-CM | POA: Diagnosis not present

## 2017-10-03 DIAGNOSIS — R7301 Impaired fasting glucose: Secondary | ICD-10-CM | POA: Diagnosis not present

## 2017-10-03 DIAGNOSIS — N183 Chronic kidney disease, stage 3 (moderate): Secondary | ICD-10-CM | POA: Diagnosis not present

## 2017-10-03 DIAGNOSIS — M25511 Pain in right shoulder: Secondary | ICD-10-CM | POA: Diagnosis not present

## 2017-10-03 DIAGNOSIS — I252 Old myocardial infarction: Secondary | ICD-10-CM | POA: Diagnosis not present

## 2017-10-03 DIAGNOSIS — Z6824 Body mass index (BMI) 24.0-24.9, adult: Secondary | ICD-10-CM | POA: Diagnosis not present

## 2017-10-03 DIAGNOSIS — Z1389 Encounter for screening for other disorder: Secondary | ICD-10-CM | POA: Diagnosis not present

## 2017-10-03 DIAGNOSIS — I25810 Atherosclerosis of coronary artery bypass graft(s) without angina pectoris: Secondary | ICD-10-CM | POA: Diagnosis not present

## 2017-10-03 DIAGNOSIS — I712 Thoracic aortic aneurysm, without rupture: Secondary | ICD-10-CM | POA: Diagnosis not present

## 2017-10-03 DIAGNOSIS — Z0000 Encounter for general adult medical examination without abnormal findings: Secondary | ICD-10-CM | POA: Diagnosis not present

## 2017-10-05 DIAGNOSIS — Z1212 Encounter for screening for malignant neoplasm of rectum: Secondary | ICD-10-CM | POA: Diagnosis not present

## 2017-10-06 DIAGNOSIS — H353212 Exudative age-related macular degeneration, right eye, with inactive choroidal neovascularization: Secondary | ICD-10-CM | POA: Diagnosis not present

## 2017-10-07 ENCOUNTER — Other Ambulatory Visit: Payer: Self-pay | Admitting: Internal Medicine

## 2017-10-07 DIAGNOSIS — I712 Thoracic aortic aneurysm, without rupture, unspecified: Secondary | ICD-10-CM

## 2017-10-11 ENCOUNTER — Other Ambulatory Visit: Payer: Self-pay | Admitting: Internal Medicine

## 2017-10-11 DIAGNOSIS — I712 Thoracic aortic aneurysm, without rupture, unspecified: Secondary | ICD-10-CM

## 2017-10-20 ENCOUNTER — Ambulatory Visit
Admission: RE | Admit: 2017-10-20 | Discharge: 2017-10-20 | Disposition: A | Discharge status: Home / Self Care | Payer: Medicare Other | Source: Ambulatory Visit | Attending: Internal Medicine | Admitting: Internal Medicine

## 2017-10-20 DIAGNOSIS — I712 Thoracic aortic aneurysm, without rupture, unspecified: Secondary | ICD-10-CM

## 2017-10-20 MED ORDER — IOPAMIDOL (ISOVUE-370) INJECTION 76%
75.0000 mL | Freq: Once | INTRAVENOUS | Status: AC | PRN
  Administered 2017-10-20: 75 mL via INTRAVENOUS

## 2017-10-26 DIAGNOSIS — M19011 Primary osteoarthritis, right shoulder: Secondary | ICD-10-CM | POA: Diagnosis not present

## 2017-10-26 DIAGNOSIS — M25511 Pain in right shoulder: Secondary | ICD-10-CM | POA: Diagnosis not present

## 2017-10-26 DIAGNOSIS — M75111 Incomplete rotator cuff tear or rupture of right shoulder, not specified as traumatic: Secondary | ICD-10-CM | POA: Diagnosis not present

## 2017-11-02 DIAGNOSIS — M25511 Pain in right shoulder: Secondary | ICD-10-CM | POA: Diagnosis not present

## 2017-11-04 DIAGNOSIS — M25511 Pain in right shoulder: Secondary | ICD-10-CM | POA: Diagnosis not present

## 2017-11-08 DIAGNOSIS — M25511 Pain in right shoulder: Secondary | ICD-10-CM | POA: Diagnosis not present

## 2017-11-10 DIAGNOSIS — M25511 Pain in right shoulder: Secondary | ICD-10-CM | POA: Diagnosis not present

## 2017-11-14 DIAGNOSIS — M25511 Pain in right shoulder: Secondary | ICD-10-CM | POA: Diagnosis not present

## 2017-11-16 DIAGNOSIS — M25511 Pain in right shoulder: Secondary | ICD-10-CM | POA: Diagnosis not present

## 2017-11-22 DIAGNOSIS — M25511 Pain in right shoulder: Secondary | ICD-10-CM | POA: Diagnosis not present

## 2017-11-23 DIAGNOSIS — M19011 Primary osteoarthritis, right shoulder: Secondary | ICD-10-CM | POA: Diagnosis not present

## 2017-11-23 DIAGNOSIS — M25511 Pain in right shoulder: Secondary | ICD-10-CM | POA: Diagnosis not present

## 2017-12-08 DIAGNOSIS — H353222 Exudative age-related macular degeneration, left eye, with inactive choroidal neovascularization: Secondary | ICD-10-CM | POA: Diagnosis not present

## 2017-12-08 DIAGNOSIS — H353212 Exudative age-related macular degeneration, right eye, with inactive choroidal neovascularization: Secondary | ICD-10-CM | POA: Diagnosis not present

## 2017-12-09 ENCOUNTER — Encounter: Payer: Self-pay | Admitting: Cardiovascular Disease

## 2017-12-09 ENCOUNTER — Ambulatory Visit (INDEPENDENT_AMBULATORY_CARE_PROVIDER_SITE_OTHER): Payer: Medicare Other | Admitting: Cardiovascular Disease

## 2017-12-09 VITALS — BP 124/64 | HR 71 | Ht 68.0 in | Wt 154.5 lb

## 2017-12-09 DIAGNOSIS — E785 Hyperlipidemia, unspecified: Secondary | ICD-10-CM

## 2017-12-09 DIAGNOSIS — I10 Essential (primary) hypertension: Secondary | ICD-10-CM | POA: Diagnosis not present

## 2017-12-09 DIAGNOSIS — I714 Abdominal aortic aneurysm, without rupture, unspecified: Secondary | ICD-10-CM

## 2017-12-09 DIAGNOSIS — I2510 Atherosclerotic heart disease of native coronary artery without angina pectoris: Secondary | ICD-10-CM

## 2017-12-09 NOTE — Progress Notes (Signed)
Cardiology Office Note   Date:  12/09/2017   ID:  Thomas Fuentes, DOB July 26, 1941, MRN [id number]  PCP:  Marton Redwood, MD  Cardiologist:   Kathlyn Sacramento, MD   Chief Complaint  Patient presents with  . OTHER    OD 6 month f/u discuss aspirin. Meds reviewed verbally with pt.      History of Present Illness: Thomas Fuentes is a 77 y.o. male who presents for a follow-up visit regarding coronary artery disease s/p CABG in 07/2016 after NSTEMI.   He has chronic medical conditions that include hypertension, hyperlipidemia (intolerant to statins), type 2 diabetes and chronic kidney disease.   He has been doing extremely well with no recent chest pain, shortness of breath or palpitations.  He is intolerant to higher dose statins but has been able to tolerate small dose rosuvastatin.  Past Medical History:  Diagnosis Date  . AAA (abdominal aortic aneurysm) ([place])   . Chronic renal disease, stage III ([place])   . Coronary artery disease    a. MI 2000 s/p PCI and 2 stent placement to unknown arteries;  LHC 08/09/2016 o-pLAD 30%, mLAD 99%, OM2 50%, OM3 85%, pRCA 99%, mid RCA 60%, RPDA 60%, LVEDP mod elevated. CABG in 07/2016 :  LIMA to LAD, SVG to RCA and SVG to distal left circumflex  . Diabetes mellitus without complication ([place])   . Dyslipidemia   . Hyperlipidemia   . Hypertension   . Hypothyroidism   . Macular degeneration    bilateral  . Metabolic syndrome   . MI (myocardial infarction) ([place])   . Peripheral neuropathy   . Peripheral vascular disease [facility])     Past Surgical History:  Procedure Laterality Date  . CARDIAC CATHETERIZATION  2000   [place] x2 stents  . CARDIAC CATHETERIZATION N/A 08/09/2016   Procedure: Left Heart Cath and Coronary Angiography;  Surgeon: Wellington Hampshire, MD;  Location: [place] [facility];  Service: Cardiovascular;  Laterality: N/A;  . CORONARY ARTERY BYPASS GRAFT N/A 08/11/2016   Procedure: CORONARY ARTERY BYPASS GRAFTING on pump using left  internal mammary artery to left anterior descending coronary artery , portion of right greater saphenous vein to right coronary artery, portion of right greater saphenous vein graft to distal circumflex.;  Surgeon: [name], MD;  Location: [place];  Service: Open Heart Surgery;  Laterality: N/A;  . CORONARY STENT PLACEMENT    . ENDOVEIN HARVEST OF GREATER SAPHENOUS VEIN Right 08/11/2016   Procedure: ENDOVEIN HARVEST OF GREATER SAPHENOUS VEIN;  Surgeon: [name], MD;  Location: [place];  Service: Open Heart Surgery;  Laterality: Right;  . TEE WITHOUT CARDIOVERSION N/A 08/11/2016   Procedure: TRANSESOPHAGEAL ECHOCARDIOGRAM (TEE);  Surgeon: [name], MD;  Location: [place];  Service: Open Heart Surgery;  Laterality: N/A;     Current Outpatient Medications  Medication Sig Dispense Refill  . aspirin EC 81 MG EC tablet Take 1 tablet (81 mg total) by mouth daily.    Marland Kitchen ezetimibe (ZETIA) 10 MG tablet Take 1 tablet (10 mg total) by mouth daily. 30 tablet 1  . fenofibrate 160 MG tablet Take 160 mg by mouth.     . gabapentin (NEURONTIN) 600 MG tablet Take 0.5 tablets (300 mg total) by mouth 3 (three) times daily. (Patient taking differently: Take 600 mg by mouth 3 (three) times daily. ) 45 tablet 0  . levothyroxine (SYNTHROID, LEVOTHROID) 25 MCG tablet Take 1 tablet (25 mcg total) by mouth daily before  breakfast. 30 tablet 1  . lisinopril (PRINIVIL,ZESTRIL) 5 MG tablet Take 1 tablet (5 mg total) by mouth daily. 90 tablet 3  . rosuvastatin (CRESTOR) 5 MG tablet TAKE 1 TABLET BY MOUTH ONCE DAILY 90 tablet 3   No current facility-administered medications for this visit.     Allergies:   Lidocaine and Statins    Social History:  The patient  reports that he quit smoking about 18 years ago. His smoking use included cigarettes. He quit after 40.00 years of use. He has never used smokeless tobacco. He reports that he drinks alcohol. He reports that he does not use drugs.   Family  History:  The patient's family history includes Hypertension in his mother.    ROS:  Please see the history of present illness.   Otherwise, review of systems are positive for none.   All other systems are reviewed and negative.    PHYSICAL EXAM: VS:  BP 124/64 (BP Location: Left Arm, Patient Position: Sitting, Cuff Size: Normal)   Pulse 71   Ht 5\' 8"  (1.727 m)   Wt 154 lb 8 oz (70.1 kg)   BMI 23.49 kg/m  , BMI Body mass index is 23.49 kg/m. GEN: Well nourished, well developed, in no acute distress  HEENT: normal  Neck: no JVD, carotid bruits, or masses Cardiac: RRR ; no rubs, or gallops,no edema .  1 out of 6 systolic murmur in the aortic area. Respiratory:  clear to auscultation bilaterally, normal work of breathing GI: soft, nontender, nondistended, + BS MS: no deformity or atrophy  Skin: warm and dry, no rash Neuro:  Strength and sensation are intact Psych: euthymic mood, full affect   EKG:  EKG is  ordered today. EKG showed normal sinus rhythm with no significant ST or T wave changes.   Recent Labs: 01/05/2017: ALT 12; TSH 3.274    Lipid Panel    Component Value Date/Time   CHOL 111 01/05/2017 1158   CHOL 192 11/19/2016 0841   TRIG 102 01/05/2017 1158   HDL 38 (L) 01/05/2017 1158   HDL 36 (L) 11/19/2016 0841   CHOLHDL 2.9 01/05/2017 1158   VLDL 20 01/05/2017 1158   LDLCALC 53 01/05/2017 1158   LDLCALC 116 (H) 11/19/2016 0841      Wt Readings from Last 3 Encounters:  12/09/17 154 lb 8 oz (70.1 kg)  02/21/17 149 lb (67.6 kg)  11/22/16 144 lb (65.3 kg)        ASSESSMENT AND PLAN:  1.  Coronary artery disease involving native coronary arteries without angina:  He is doing well with no anginal symptoms. Continue medical therapy .  2. Abdominal aortic aneurysm:  Ultrasound in February showed stable size at 3.3 cm. Repeat study next year.  4. Hyperlipidemia: He is now tolerating small dose rosuvastatin with Zetia and fenofibrate.  I reviewed recent lipid  profile done by his primary care physician which showed an LDL of 70 and triglyceride of 170.  Continue same medications.  5. Essential hypertension: Blood pressure is well controlled on small dose lisinopril.  6. Mildly dilated aortic root at 4.2 cm.  CT scan in February showed stable size.  Disposition:   FU with me in 12 months  Signed,  Kathlyn Sacramento, MD  12/09/2017 2:59 PM    [place]

## 2017-12-09 NOTE — Patient Instructions (Signed)
Medication Instructions: Continue same medications.   Labwork: None.   Procedures/Testing: None.   Follow-Up: 1 year with Dr. Arida.   Any Additional Special Instructions Will Be Listed Below (If Applicable).     If you need a refill on your cardiac medications before your next appointment, please call your pharmacy.   

## 2018-03-01 DIAGNOSIS — H353212 Exudative age-related macular degeneration, right eye, with inactive choroidal neovascularization: Secondary | ICD-10-CM | POA: Diagnosis not present

## 2018-03-01 DIAGNOSIS — H353222 Exudative age-related macular degeneration, left eye, with inactive choroidal neovascularization: Secondary | ICD-10-CM | POA: Diagnosis not present

## 2018-03-11 ENCOUNTER — Other Ambulatory Visit: Payer: Self-pay | Admitting: Cardiovascular Disease

## 2018-03-11 DIAGNOSIS — E785 Hyperlipidemia, unspecified: Secondary | ICD-10-CM

## 2018-03-27 DIAGNOSIS — C44619 Basal cell carcinoma of skin of left upper limb, including shoulder: Secondary | ICD-10-CM | POA: Diagnosis not present

## 2018-03-27 DIAGNOSIS — L821 Other seborrheic keratosis: Secondary | ICD-10-CM | POA: Diagnosis not present

## 2018-03-27 DIAGNOSIS — L570 Actinic keratosis: Secondary | ICD-10-CM | POA: Diagnosis not present

## 2018-03-27 DIAGNOSIS — D225 Melanocytic nevi of trunk: Secondary | ICD-10-CM | POA: Diagnosis not present

## 2018-03-27 DIAGNOSIS — L814 Other melanin hyperpigmentation: Secondary | ICD-10-CM | POA: Diagnosis not present

## 2018-03-27 DIAGNOSIS — D485 Neoplasm of uncertain behavior of skin: Secondary | ICD-10-CM | POA: Diagnosis not present

## 2018-03-27 DIAGNOSIS — C4441 Basal cell carcinoma of skin of scalp and neck: Secondary | ICD-10-CM | POA: Diagnosis not present

## 2018-03-27 DIAGNOSIS — Z85828 Personal history of other malignant neoplasm of skin: Secondary | ICD-10-CM | POA: Diagnosis not present

## 2018-03-27 DIAGNOSIS — D1800 Hemangioma unspecified site: Secondary | ICD-10-CM | POA: Diagnosis not present

## 2018-03-28 DIAGNOSIS — D485 Neoplasm of uncertain behavior of skin: Secondary | ICD-10-CM | POA: Diagnosis not present

## 2018-04-27 ENCOUNTER — Other Ambulatory Visit: Payer: Self-pay | Admitting: Cardiovascular Disease

## 2018-05-10 DIAGNOSIS — H353222 Exudative age-related macular degeneration, left eye, with inactive choroidal neovascularization: Secondary | ICD-10-CM | POA: Diagnosis not present

## 2018-05-10 DIAGNOSIS — H353212 Exudative age-related macular degeneration, right eye, with inactive choroidal neovascularization: Secondary | ICD-10-CM | POA: Diagnosis not present

## 2018-05-11 DIAGNOSIS — C4441 Basal cell carcinoma of skin of scalp and neck: Secondary | ICD-10-CM | POA: Diagnosis not present

## 2018-05-25 DIAGNOSIS — C4441 Basal cell carcinoma of skin of scalp and neck: Secondary | ICD-10-CM | POA: Diagnosis not present

## 2018-05-25 DIAGNOSIS — L570 Actinic keratosis: Secondary | ICD-10-CM | POA: Diagnosis not present

## 2018-05-25 DIAGNOSIS — D485 Neoplasm of uncertain behavior of skin: Secondary | ICD-10-CM | POA: Diagnosis not present

## 2018-06-08 DIAGNOSIS — C4441 Basal cell carcinoma of skin of scalp and neck: Secondary | ICD-10-CM | POA: Diagnosis not present

## 2018-06-20 DIAGNOSIS — Z23 Encounter for immunization: Secondary | ICD-10-CM | POA: Diagnosis not present

## 2018-08-02 DIAGNOSIS — H353222 Exudative age-related macular degeneration, left eye, with inactive choroidal neovascularization: Secondary | ICD-10-CM | POA: Diagnosis not present

## 2018-08-02 DIAGNOSIS — H353212 Exudative age-related macular degeneration, right eye, with inactive choroidal neovascularization: Secondary | ICD-10-CM | POA: Diagnosis not present

## 2018-10-04 DIAGNOSIS — R82998 Other abnormal findings in urine: Secondary | ICD-10-CM | POA: Diagnosis not present

## 2018-10-04 DIAGNOSIS — I10 Essential (primary) hypertension: Secondary | ICD-10-CM | POA: Diagnosis not present

## 2018-10-04 DIAGNOSIS — R7301 Impaired fasting glucose: Secondary | ICD-10-CM | POA: Diagnosis not present

## 2018-10-04 DIAGNOSIS — E7849 Other hyperlipidemia: Secondary | ICD-10-CM | POA: Diagnosis not present

## 2018-10-04 DIAGNOSIS — Z125 Encounter for screening for malignant neoplasm of prostate: Secondary | ICD-10-CM | POA: Diagnosis not present

## 2018-10-04 DIAGNOSIS — E038 Other specified hypothyroidism: Secondary | ICD-10-CM | POA: Diagnosis not present

## 2018-10-11 DIAGNOSIS — Z1331 Encounter for screening for depression: Secondary | ICD-10-CM | POA: Diagnosis not present

## 2018-10-11 DIAGNOSIS — R7301 Impaired fasting glucose: Secondary | ICD-10-CM | POA: Diagnosis not present

## 2018-10-11 DIAGNOSIS — E7849 Other hyperlipidemia: Secondary | ICD-10-CM | POA: Diagnosis not present

## 2018-10-11 DIAGNOSIS — Z1212 Encounter for screening for malignant neoplasm of rectum: Secondary | ICD-10-CM | POA: Diagnosis not present

## 2018-10-11 DIAGNOSIS — N183 Chronic kidney disease, stage 3 (moderate): Secondary | ICD-10-CM | POA: Diagnosis not present

## 2018-10-11 DIAGNOSIS — I25810 Atherosclerosis of coronary artery bypass graft(s) without angina pectoris: Secondary | ICD-10-CM | POA: Diagnosis not present

## 2018-10-11 DIAGNOSIS — Z1339 Encounter for screening examination for other mental health and behavioral disorders: Secondary | ICD-10-CM | POA: Diagnosis not present

## 2018-10-11 DIAGNOSIS — Z6824 Body mass index (BMI) 24.0-24.9, adult: Secondary | ICD-10-CM | POA: Diagnosis not present

## 2018-10-11 DIAGNOSIS — E8881 Metabolic syndrome: Secondary | ICD-10-CM | POA: Diagnosis not present

## 2018-10-11 DIAGNOSIS — Z0000 Encounter for general adult medical examination without abnormal findings: Secondary | ICD-10-CM | POA: Diagnosis not present

## 2018-10-11 DIAGNOSIS — I10 Essential (primary) hypertension: Secondary | ICD-10-CM | POA: Diagnosis not present

## 2018-10-11 DIAGNOSIS — I252 Old myocardial infarction: Secondary | ICD-10-CM | POA: Diagnosis not present

## 2018-10-11 DIAGNOSIS — I712 Thoracic aortic aneurysm, without rupture: Secondary | ICD-10-CM | POA: Diagnosis not present

## 2018-10-16 ENCOUNTER — Other Ambulatory Visit: Payer: Self-pay | Admitting: Internal Medicine

## 2018-10-16 DIAGNOSIS — I712 Thoracic aortic aneurysm, without rupture, unspecified: Secondary | ICD-10-CM

## 2018-10-23 ENCOUNTER — Ambulatory Visit
Admission: RE | Admit: 2018-10-23 | Discharge: 2018-10-23 | Disposition: A | Discharge status: Home / Self Care | Payer: Medicare Other | Source: Ambulatory Visit | Attending: Internal Medicine | Admitting: Internal Medicine

## 2018-10-23 DIAGNOSIS — I712 Thoracic aortic aneurysm, without rupture, unspecified: Secondary | ICD-10-CM

## 2018-10-23 MED ORDER — IOPAMIDOL (ISOVUE-370) INJECTION 76%
75.0000 mL | Freq: Once | INTRAVENOUS | Status: AC | PRN
  Administered 2018-10-23: 75 mL via INTRAVENOUS

## 2018-10-25 DIAGNOSIS — H353212 Exudative age-related macular degeneration, right eye, with inactive choroidal neovascularization: Secondary | ICD-10-CM | POA: Diagnosis not present

## 2018-10-25 DIAGNOSIS — H353222 Exudative age-related macular degeneration, left eye, with inactive choroidal neovascularization: Secondary | ICD-10-CM | POA: Diagnosis not present

## 2018-12-05 ENCOUNTER — Telehealth (INDEPENDENT_AMBULATORY_CARE_PROVIDER_SITE_OTHER): Payer: Medicare Other | Admitting: Cardiothoracic Surgery

## 2018-12-05 DIAGNOSIS — I2510 Atherosclerotic heart disease of native coronary artery without angina pectoris: Secondary | ICD-10-CM

## 2018-12-05 DIAGNOSIS — I714 Abdominal aortic aneurysm, without rupture: Secondary | ICD-10-CM | POA: Diagnosis not present

## 2018-12-05 NOTE — Progress Notes (Deleted)

## 2018-12-05 NOTE — Telephone Encounter (Signed)
Thomas Fuentes       Thomas Fuentes             [phone]                    Thomas Fuentes [place] Medical Record #[id number] Date of Birth: [birth year]  Referring: Dr Brigitte Pulse Primary Care: Marton Redwood, MD Primary Cardiologist: No primary care provider on file.  Chief Complaint:   Mildly dilated ascending aorta 08/11/2016 DATE OF DISCHARGE:  OPERATIVE REPORT PREOPERATIVE DIAGNOSES:  Coronary occlusive disease with unstable angina. POSTOPERATIVE DIAGNOSIS:  Coronary occlusive disease with unstable angina. PROCEDURE PERFORMED:  Coronary artery bypass grafting x4 with left internal mammary to the left anterior descending coronary artery, sequential reverse saphenous vein graft to the first obtuse marginal and distal circumflex, reverse saphenous vein graft to the posterior descending coronary artery with endoscopic right greater saphenous vein harvesting, thigh and calf. SURGEON:  Lanelle Bal, MD.  History of Present Illness:          I contacted Thomas Fuentes remotely due to the limitations of the current COVID pandemic on 12/05/2018  at  3:51 PM  verifying that I was speaking to Thomas Fuentes whose birthday is [birth year].   I discussed limitations of the evaluation and management  of patients remotely without  the benefit of physical exam.  The patient was agreeable with proceeding with a remote/ not face to face visit.   Prior to surgery done in 2017  A CT scan was done on the chest, which demonstrated mild enlargement of his ascending aorta with maximum dimension of approximately 4.2 cm with evidence of a trileaflet aortic valve.   Patient had a follow-up CT scan in February 2020 that showed slight change to 4.4 cm compared to 2019 and 2017 and the size of his a sending aorta.  At the time of surgery is noted to have a trileaflet aortic valve.  Since the patient's bypass surgery he denies any cardiac symptoms, he denies angina evidence of  congestive heart failure, notes that his blood pressures been under good control and in fact has decreased some of his blood pressure medications.   He remains active working in his yard on a regular basis.    Current Activity/ Functional Status:  Patient is independent with mobility/ambulation, transfers, ADL's, IADL's.   Zubrod Score: At the time of surgery this patient's most appropriate activity status/level should be described as: [x]     0    Normal activity, no symptoms []     1    Restricted in physical strenuous activity but ambulatory, able to do out light work []     2    Ambulatory and capable of self care, unable to do work activities, up and about               >50 % of waking hours                              []     3    Only limited self care, in bed greater than 50% of waking hours []     4    Completely disabled, no self care, confined to bed or chair []     5    Moribund   Past Medical History:  Diagnosis Date  . AAA (abdominal aortic aneurysm) ([place])   . Chronic renal disease, stage III (  [place])   . Coronary artery disease    a. MI 2000 s/p PCI and 2 stent placement to unknown arteries;  LHC 08/09/2016 o-pLAD 30%, mLAD 99%, OM2 50%, OM3 85%, pRCA 99%, mid RCA 60%, RPDA 60%, LVEDP mod elevated. CABG in 07/2016 :  LIMA to LAD, SVG to RCA and SVG to distal left circumflex  . Diabetes mellitus without complication ([place])   . Dyslipidemia   . Hyperlipidemia   . Hypertension   . Hypothyroidism   . Macular degeneration    bilateral  . Metabolic syndrome   . MI (myocardial infarction) ([place])   . Peripheral neuropathy   . Peripheral vascular disease [facility])     Past Surgical History:  Procedure Laterality Date  . CARDIAC CATHETERIZATION  2000   [place] x2 stents  . CARDIAC CATHETERIZATION N/A 08/09/2016   Procedure: Left Heart Cath and Coronary Angiography;  Surgeon: Wellington Hampshire, MD;  Location: [place] [facility];  Service: Cardiovascular;  Laterality: N/A;  . CORONARY  ARTERY BYPASS GRAFT N/A 08/11/2016   Procedure: CORONARY ARTERY BYPASS GRAFTING on pump using left internal mammary artery to left anterior descending coronary artery , portion of right greater saphenous vein to right coronary artery, portion of right greater saphenous vein graft to distal circumflex.;  Surgeon: [name], MD;  Location: [place];  Service: Open Heart Surgery;  Laterality: N/A;  . CORONARY STENT PLACEMENT    . ENDOVEIN HARVEST OF GREATER SAPHENOUS VEIN Right 08/11/2016   Procedure: ENDOVEIN HARVEST OF GREATER SAPHENOUS VEIN;  Surgeon: [name], MD;  Location: [place];  Service: Open Heart Surgery;  Laterality: Right;  . TEE WITHOUT CARDIOVERSION N/A 08/11/2016   Procedure: TRANSESOPHAGEAL ECHOCARDIOGRAM (TEE);  Surgeon: [name], MD;  Location: [place];  Service: Open Heart Surgery;  Laterality: N/A;    Family History  Problem Relation Age of Onset  . Hypertension Mother      Social History   Tobacco Use  Smoking Status Former Smoker  . Years: 40.00  . Types: Cigarettes  . Last attempt to quit: 02/06/1999  . Years since quitting: 19.8  Smokeless Tobacco Never Used    Social History   Substance and Sexual Activity  Alcohol Use Yes   Comment: occasional     Allergies  Allergen Reactions  . Lidocaine Anaphylaxis    novacaine and lidocaine  . Quinolones   . Statins Nausea And Vomiting    GI symptoms on almost all statins    Current Outpatient Medications  Medication Sig Dispense Refill  . aspirin EC 81 MG EC tablet Take 1 tablet (81 mg total) by mouth daily.    Marland Kitchen ezetimibe (ZETIA) 10 MG tablet Take 1 tablet (10 mg total) by mouth daily. 30 tablet 1  . fenofibrate 160 MG tablet Take 160 mg by mouth.     . gabapentin (NEURONTIN) 600 MG tablet Take 0.5 tablets (300 mg total) by mouth 3 (three) times daily. (Patient taking differently: Take 600 mg by mouth 3 (three) times daily. ) 45 tablet 0  . levothyroxine (SYNTHROID, LEVOTHROID) 25 MCG  tablet Take 1 tablet (25 mcg total) by mouth daily before breakfast. 30 tablet 1  . lisinopril (PRINIVIL,ZESTRIL) 5 MG tablet TAKE 1 TABLET BY MOUTH ONCE DAILY 90 tablet 2  . rosuvastatin (CRESTOR) 5 MG tablet TAKE 1 TABLET BY MOUTH ONCE DAILY 90 tablet 3   No current facility-administered medications for this visit.     Pertinent items are noted in HPI.  Review of Systems:     Cardiac Review of Systems: [Y] = yes  or   [ N ] = no   Chest Pain [   n ]  Resting SOB [ n  ] Exertional SOB  [ n ]  Orthopnea [ n]   Pedal Edema Florencio.Farrier  ]    Palpitations [ n ] Syncope  [ n ]   Presyncope [n   ]   General Review of Systems: [Y] = yes [  ]=no Constitional: recent weight change [  ];  Wt loss over the last 3 months [   ] anorexia [  ]; fatigue [  ]; nausea [  ]; night sweats [  ]; fever [  ]; or chills [  ];           Eye : blurred vision [  ]; diplopia [   ]; vision changes [  ];  Amaurosis fugax[  ]; Resp: cough [  ];  wheezing[  ];  hemoptysis[  ]; shortness of breath[  ]; paroxysmal nocturnal dyspnea[  ]; dyspnea on exertion[  ]; or orthopnea[  ];  GI:  gallstones[  ], vomiting[  ];  dysphagia[  ]; melena[  ];  hematochezia [  ]; heartburn[  ];   Hx of  Colonoscopy[  ]; GU: kidney stones [  ]; hematuria[  ];   dysuria [  ];  nocturia[  ];  history of     obstruction [  ]; urinary frequency [  ]             Skin: rash, swelling[  ];, hair loss[  ];  peripheral edema[  ];  or itching[  ]; Musculosketetal: myalgias[  ];  joint swelling[  ];  joint erythema[  ];  joint pain[  ];  back pain[  ];  Heme/Lymph: bruising[  ];  bleeding[  ];  anemia[  ];  Neuro: TIA[  ];  headaches[  ];  stroke[  ];  vertigo[  ];  seizures[  ];   paresthesias[  ];  difficulty walking[  ];  Psych:depression[  ]; anxiety[  ];  Endocrine: diabetes[  ];  thyroid dysfunction[  ];  Immunizations: Flu up to date Blue.Reese  ]; Pneumococcal up to date [ y ];  Other:     PHYSICAL EXAMINATION:   Diagnostic Studies & Laboratory data:      Recent Radiology Findings:     Recent Lab Findings: Lab Results  Component Value Date   WBC 8.5 08/15/2016   HGB 8.5 (L) 08/15/2016   HCT 26.7 (L) 08/15/2016   PLT 210 08/15/2016   GLUCOSE 94 11/19/2016   CHOL 111 01/05/2017   TRIG 102 01/05/2017   HDL 38 (L) 01/05/2017   LDLCALC 53 01/05/2017   ALT 12 (L) 01/05/2017   AST 20 01/05/2017   NA 143 11/19/2016   K 4.8 11/19/2016   CL 101 11/19/2016   CREATININE 1.12 11/19/2016   BUN 23 11/19/2016   CO2 26 11/19/2016   TSH 3.274 01/05/2017   INR 1.41 08/11/2016   HGBA1C 6.0 (H) 08/10/2016   CT: CLINICAL DATA:  Thoracic aortic aneurysm  EXAM: CT ANGIOGRAPHY CHEST WITH CONTRAST  TECHNIQUE: Multidetector CT imaging of the chest was performed using the standard protocol during bolus administration of intravenous contrast. Multiplanar CT image reconstructions and MIPs were obtained to evaluate the vascular anatomy.  CONTRAST:  31mL ISOVUE-370 IOPAMIDOL (ISOVUE-370) INJECTION 76%  COMPARISON:  10/20/2017  FINDINGS:  Cardiovascular: Minimal measurable increase in the ascending thoracic aortic fusiform aneurysm measuring 4.4 x 4.2 cm, previously 4.2 x 4.1 cm at the level of the main pulmonary artery. Sinus of Valsalva aortic diameter is 4.4 cm, unchanged. Sino-tubular junction is 3.8 cm, unchanged. No acute dissection. Patent 3 vessel arch anatomy. Previous coronary bypass changes noted. No mediastinal hemorrhage or hematoma. Visualized pulmonary arteries are patent. Heart is mildly enlarged. Native coronary atherosclerosis noted. No pericardial effusion.  Mediastinum/Nodes: No enlarged mediastinal, hilar, or axillary lymph nodes. Thyroid gland, trachea, and esophagus demonstrate no significant findings.  Lungs/Pleura: Minor emphysema pattern as before. No acute airspace process, collapse or consolidation. Negative for edema or interstitial disease. No pleural abnormality, effusion or pneumothorax. Trachea  and central airways are patent.  Upper Abdomen: No acute abnormality.  Musculoskeletal: Degenerative changes of the spine. No acute osseous finding.  Review of the MIP images confirms the above findings.  IMPRESSION: Minimal measurable enlargement of the ascending fusiform thoracic aortic aneurysm, maximal diameter 4.4 cm, previously 4.2 cm.  Recommend annual imaging followup by CTA or MRA. This recommendation follows 2010 ACCF/AHA/AATS/ACR/ASA/SCA/SCAI/SIR/STS/SVM Guidelines for the Diagnosis and Management of Patients with Thoracic Aortic Disease. Circulation. 2010; 121: Z662-H476. Aortic aneurysm NOS (ICD10-I71.9)  No other acute intrathoracic finding.  Aortic Atherosclerosis (ICD10-I70.0) and Emphysema (ICD10-J43.9).   Electronically Signed   By: Jerilynn Mages.  Shick M.D.   On: 10/23/2018 13:22    I have independently reviewed the above radiology studies  and reviewed the findings with the patient.   Assessment / Plan:   #1 mildly dilated ascending aorta 4.2 to 4.4 cm, with a trileaflet aortic valve, no family history of aortic dissection or sudden death at an early age #2 history of coronary artery disease now 2 years status post coronary artery bypass grafting without cardiac symptoms  I reviewed the findings on the patient's CT scans both prior to his cardiac surgery and in 2019 and in 2020, I recommended we obtain a follow-up CTA of the chest in March 2021.  The office will call the patient 6 to 8 weeks ahead and arrange for the CTA  to be done. the patient was cautioned about strenuous lifting, avoiding use of quinolones, and the importance of good blood pressure control.  An information sheet about ascending aortic dilatation will be mailed to the patient.     I  spent 15 minutes with  the patient on the phone     [name] MD      [address] [place] [address] Office [phone]   Beeper [phone]  12/05/2018 3:51 PM

## 2018-12-07 ENCOUNTER — Encounter: Payer: PRIVATE HEALTH INSURANCE | Admitting: Cardiothoracic Surgery

## 2019-01-15 ENCOUNTER — Other Ambulatory Visit: Payer: Self-pay | Admitting: Cardiovascular Disease

## 2019-01-15 DIAGNOSIS — H353222 Exudative age-related macular degeneration, left eye, with inactive choroidal neovascularization: Secondary | ICD-10-CM | POA: Diagnosis not present

## 2019-01-15 DIAGNOSIS — H353212 Exudative age-related macular degeneration, right eye, with inactive choroidal neovascularization: Secondary | ICD-10-CM | POA: Diagnosis not present

## 2019-02-28 ENCOUNTER — Other Ambulatory Visit: Payer: Self-pay | Admitting: Cardiovascular Disease

## 2019-02-28 DIAGNOSIS — E785 Hyperlipidemia, unspecified: Secondary | ICD-10-CM

## 2019-03-28 DIAGNOSIS — L570 Actinic keratosis: Secondary | ICD-10-CM | POA: Diagnosis not present

## 2019-03-28 DIAGNOSIS — L821 Other seborrheic keratosis: Secondary | ICD-10-CM | POA: Diagnosis not present

## 2019-03-28 DIAGNOSIS — L814 Other melanin hyperpigmentation: Secondary | ICD-10-CM | POA: Diagnosis not present

## 2019-03-28 DIAGNOSIS — Z85828 Personal history of other malignant neoplasm of skin: Secondary | ICD-10-CM | POA: Diagnosis not present

## 2019-03-28 DIAGNOSIS — D225 Melanocytic nevi of trunk: Secondary | ICD-10-CM | POA: Diagnosis not present

## 2019-04-05 DIAGNOSIS — H353222 Exudative age-related macular degeneration, left eye, with inactive choroidal neovascularization: Secondary | ICD-10-CM | POA: Diagnosis not present

## 2019-04-05 DIAGNOSIS — H353212 Exudative age-related macular degeneration, right eye, with inactive choroidal neovascularization: Secondary | ICD-10-CM | POA: Diagnosis not present

## 2019-04-13 ENCOUNTER — Ambulatory Visit (INDEPENDENT_AMBULATORY_CARE_PROVIDER_SITE_OTHER): Payer: Medicare Other | Admitting: Cardiovascular Disease

## 2019-04-13 ENCOUNTER — Other Ambulatory Visit: Payer: Self-pay

## 2019-04-13 VITALS — BP 120/60 | HR 68 | Ht 67.5 in | Wt 151.5 lb

## 2019-04-13 DIAGNOSIS — I10 Essential (primary) hypertension: Secondary | ICD-10-CM | POA: Diagnosis not present

## 2019-04-13 DIAGNOSIS — I714 Abdominal aortic aneurysm, without rupture, unspecified: Secondary | ICD-10-CM

## 2019-04-13 DIAGNOSIS — I2510 Atherosclerotic heart disease of native coronary artery without angina pectoris: Secondary | ICD-10-CM

## 2019-04-13 DIAGNOSIS — E785 Hyperlipidemia, unspecified: Secondary | ICD-10-CM | POA: Diagnosis not present

## 2019-04-13 NOTE — Patient Instructions (Signed)
Medication Instructions:  Your physician recommends that you continue on your current medications as directed. Please refer to the Current Medication list given to you today.  If you need a refill on your cardiac medications before your next appointment, please call your pharmacy.   Lab work: None ordered If you have labs (blood work) drawn today and your tests are completely normal, you will receive your results only by: Marland Kitchen MyChart Message (if you have MyChart) OR . A paper copy in the mail If you have any lab test that is abnormal or we need to change your treatment, we will call you to review the results.  Testing/Procedures: Your physician has requested that you have an abdominal aorta duplex. During this test, an ultrasound is used to evaluate the aorta. Allow 30 minutes for this exam. Do not eat after midnight the day before and avoid carbonated beverages   Follow-Up: At [facility], you and your health needs are our priority.  As part of our continuing mission to provide you with exceptional heart care, we have created designated Provider Care Teams.  These Care Teams include your primary Cardiologist (physician) and Advanced Practice Providers (APPs -  Physician Assistants and Nurse Practitioners) who all work together to provide you with the care you need, when you need it. You will need a follow up appointment in 12 months.  Please call our office 2 months in advance to schedule this appointment.  You may see Dr. Fletcher Anon or one of the following Advanced Practice Providers on your designated Care Team:   Murray Hodgkins, NP Christell Faith, PA-C . Marrianne Mood, PA-C  Any Other Special Instructions Will Be Listed Below (If Applicable). N/A

## 2019-04-13 NOTE — Progress Notes (Signed)
Cardiology Office Note   Date:  04/13/2019   ID:  Thomas Fuentes, DOB 1940/10/08, MRN [id number]  PCP:  Marton Redwood, MD  Cardiologist:   Kathlyn Sacramento, MD   Chief Complaint  Patient presents with  . other    12 month follow up. Meds reviewed verbally with patient.       History of Present Illness: Thomas Fuentes is a 78 y.o. male who presents for a follow-up visit regarding coronary artery disease s/p CABG in 07/2016 after NSTEMI.   He has chronic medical conditions that include hypertension, hyperlipidemia (intolerant to statins), type 2 diabetes, small ascending aortic aneurysm, small abdominal aortic aneurysm and chronic kidney disease.   He has been doing extremely well with no recent chest pain, shortness of breath or palpitations.  He is intolerant to higher dose statins but has been able to tolerate small dose rosuvastatin.  Past Medical History:  Diagnosis Date  . AAA (abdominal aortic aneurysm) ([place])   . Chronic renal disease, stage III ([place])   . Coronary artery disease    a. MI 2000 s/p PCI and 2 stent placement to unknown arteries;  LHC 08/09/2016 o-pLAD 30%, mLAD 99%, OM2 50%, OM3 85%, pRCA 99%, mid RCA 60%, RPDA 60%, LVEDP mod elevated. CABG in 07/2016 :  LIMA to LAD, SVG to RCA and SVG to distal left circumflex  . Diabetes mellitus without complication ([place])   . Dyslipidemia   . Hyperlipidemia   . Hypertension   . Hypothyroidism   . Macular degeneration    bilateral  . Metabolic syndrome   . MI (myocardial infarction) ([place])   . Peripheral neuropathy   . Peripheral vascular disease [facility])     Past Surgical History:  Procedure Laterality Date  . CARDIAC CATHETERIZATION  2000   [place] x2 stents  . CARDIAC CATHETERIZATION N/A 08/09/2016   Procedure: Left Heart Cath and Coronary Angiography;  Surgeon: Wellington Hampshire, MD;  Location: [place] [facility];  Service: Cardiovascular;  Laterality: N/A;  . CORONARY ARTERY BYPASS GRAFT N/A 08/11/2016   Procedure: CORONARY ARTERY BYPASS GRAFTING on pump using left internal mammary artery to left anterior descending coronary artery , portion of right greater saphenous vein to right coronary artery, portion of right greater saphenous vein graft to distal circumflex.;  Surgeon: [name], MD;  Location: [place];  Service: Open Heart Surgery;  Laterality: N/A;  . CORONARY STENT PLACEMENT    . ENDOVEIN HARVEST OF GREATER SAPHENOUS VEIN Right 08/11/2016   Procedure: ENDOVEIN HARVEST OF GREATER SAPHENOUS VEIN;  Surgeon: [name], MD;  Location: [place];  Service: Open Heart Surgery;  Laterality: Right;  . TEE WITHOUT CARDIOVERSION N/A 08/11/2016   Procedure: TRANSESOPHAGEAL ECHOCARDIOGRAM (TEE);  Surgeon: [name], MD;  Location: [place];  Service: Open Heart Surgery;  Laterality: N/A;     Current Outpatient Medications  Medication Sig Dispense Refill  . aspirin EC 81 MG EC tablet Take 1 tablet (81 mg total) by mouth daily.    Marland Kitchen ezetimibe (ZETIA) 10 MG tablet Take 1 tablet (10 mg total) by mouth daily. 30 tablet 1  . fenofibrate 160 MG tablet Take 160 mg by mouth.     . gabapentin (NEURONTIN) 600 MG tablet Take 600 mg by mouth 3 (three) times daily.    Marland Kitchen levothyroxine (SYNTHROID, LEVOTHROID) 25 MCG tablet Take 1 tablet (25 mcg total) by mouth daily before breakfast. 30 tablet 1  . lisinopril (ZESTRIL) 5 MG tablet  Take 1 tablet by mouth once daily 90 tablet 0  . rosuvastatin (CRESTOR) 5 MG tablet Take 1 tablet by mouth once daily 90 tablet 0   No current facility-administered medications for this visit.     Allergies:   Lidocaine, Quinolones, and Statins    Social History:  The patient  reports that he quit smoking about 20 years ago. His smoking use included cigarettes. He quit after 40.00 years of use. He has never used smokeless tobacco. He reports current alcohol use. He reports that he does not use drugs.   Family History:  The patient's family history includes  Hypertension in his mother.    ROS:  Please see the history of present illness.   Otherwise, review of systems are positive for none.   All other systems are reviewed and negative.    PHYSICAL EXAM: VS:  BP 120/60 (BP Location: Left Arm, Patient Position: Sitting, Cuff Size: Normal)   Pulse 68   Ht 5' 7.5" (1.715 m)   Wt 151 lb 8 oz (68.7 kg)   BMI 23.38 kg/m  , BMI Body mass index is 23.38 kg/m. GEN: Well nourished, well developed, in no acute distress  HEENT: normal  Neck: no JVD, carotid bruits, or masses Cardiac: RRR ; no rubs, or gallops,no edema .  1 out of 6 systolic murmur in the aortic area. Respiratory:  clear to auscultation bilaterally, normal work of breathing GI: soft, nontender, nondistended, + BS MS: no deformity or atrophy  Skin: warm and dry, no rash Neuro:  Strength and sensation are intact Psych: euthymic mood, full affect   EKG:  EKG is  ordered today. EKG showed normal sinus rhythm with incomplete right bundle branch block.   Recent Labs: No results found for requested labs within last 8760 hours.    Lipid Panel    Component Value Date/Time   CHOL 111 01/05/2017 1158   CHOL 192 11/19/2016 0841   TRIG 102 01/05/2017 1158   HDL 38 (L) 01/05/2017 1158   HDL 36 (L) 11/19/2016 0841   CHOLHDL 2.9 01/05/2017 1158   VLDL 20 01/05/2017 1158   LDLCALC 53 01/05/2017 1158   LDLCALC 116 (H) 11/19/2016 0841      Wt Readings from Last 3 Encounters:  04/13/19 151 lb 8 oz (68.7 kg)  12/09/17 154 lb 8 oz (70.1 kg)  02/21/17 149 lb (67.6 kg)        ASSESSMENT AND PLAN:  1.  Coronary artery disease involving native coronary arteries without angina:  He is doing well with no anginal symptoms. Continue medical therapy .  2. Abdominal aortic aneurysm: Most recently this was 3.3 cm more than 3 years ago.  I requested a follow-up abdominal aortic ultrasound.    3. Hyperlipidemia: He is tolerating small dose rosuvastatin with Zetia and fenofibrate.  He gets  his labs done with his primary care physician.  4. Essential hypertension: Blood pressure is well controlled on small dose lisinopril.  5. Mildly dilated aortic root at 4.5 cm.  He saw Dr. Servando Snare recently with plans for repeat imaging in 1 year.   Disposition:   FU with me in 12 months  Signed,  Kathlyn Sacramento, MD  04/13/2019 3:15 PM    [place]

## 2019-04-15 ENCOUNTER — Other Ambulatory Visit: Payer: Self-pay | Admitting: Cardiovascular Disease

## 2019-05-08 DIAGNOSIS — Z23 Encounter for immunization: Secondary | ICD-10-CM | POA: Diagnosis not present

## 2019-05-21 ENCOUNTER — Ambulatory Visit (INDEPENDENT_AMBULATORY_CARE_PROVIDER_SITE_OTHER): Payer: Medicare Other

## 2019-05-21 ENCOUNTER — Other Ambulatory Visit: Payer: Self-pay

## 2019-05-21 DIAGNOSIS — I714 Abdominal aortic aneurysm, without rupture, unspecified: Secondary | ICD-10-CM

## 2019-05-28 ENCOUNTER — Telehealth: Payer: Self-pay

## 2019-05-28 DIAGNOSIS — I714 Abdominal aortic aneurysm, without rupture, unspecified: Secondary | ICD-10-CM

## 2019-05-28 NOTE — Telephone Encounter (Signed)
Patient made aware of AAA duplex results with verbalized understanding. Order in [place] for 1 year repeat testing.

## 2019-05-28 NOTE — Telephone Encounter (Signed)
-----   Message from Wellington Hampshire, MD sent at 05/28/2019  1:33 PM EDT ----- Stable small aortic aneurysm. Repeat in 1 year.

## 2019-05-28 NOTE — Telephone Encounter (Signed)
error 

## 2019-06-07 ENCOUNTER — Other Ambulatory Visit: Payer: Self-pay | Admitting: Cardiovascular Disease

## 2019-06-07 DIAGNOSIS — E785 Hyperlipidemia, unspecified: Secondary | ICD-10-CM

## 2019-06-27 DIAGNOSIS — H353222 Exudative age-related macular degeneration, left eye, with inactive choroidal neovascularization: Secondary | ICD-10-CM | POA: Diagnosis not present

## 2019-08-10 DIAGNOSIS — H353222 Exudative age-related macular degeneration, left eye, with inactive choroidal neovascularization: Secondary | ICD-10-CM | POA: Diagnosis not present

## 2019-09-24 DIAGNOSIS — H353222 Exudative age-related macular degeneration, left eye, with inactive choroidal neovascularization: Secondary | ICD-10-CM | POA: Diagnosis not present

## 2019-09-24 DIAGNOSIS — H353212 Exudative age-related macular degeneration, right eye, with inactive choroidal neovascularization: Secondary | ICD-10-CM | POA: Diagnosis not present

## 2019-10-04 ENCOUNTER — Other Ambulatory Visit: Payer: Self-pay | Admitting: *Deleted

## 2019-10-04 DIAGNOSIS — I712 Thoracic aortic aneurysm, without rupture, unspecified: Secondary | ICD-10-CM

## 2019-10-17 DIAGNOSIS — R7301 Impaired fasting glucose: Secondary | ICD-10-CM | POA: Diagnosis not present

## 2019-10-17 DIAGNOSIS — E038 Other specified hypothyroidism: Secondary | ICD-10-CM | POA: Diagnosis not present

## 2019-10-17 DIAGNOSIS — M8589 Other specified disorders of bone density and structure, multiple sites: Secondary | ICD-10-CM | POA: Diagnosis not present

## 2019-10-17 DIAGNOSIS — E7849 Other hyperlipidemia: Secondary | ICD-10-CM | POA: Diagnosis not present

## 2019-10-19 DIAGNOSIS — I10 Essential (primary) hypertension: Secondary | ICD-10-CM | POA: Diagnosis not present

## 2019-10-19 DIAGNOSIS — R82998 Other abnormal findings in urine: Secondary | ICD-10-CM | POA: Diagnosis not present

## 2019-10-24 DIAGNOSIS — I714 Abdominal aortic aneurysm, without rupture: Secondary | ICD-10-CM | POA: Diagnosis not present

## 2019-10-24 DIAGNOSIS — Z0000 Encounter for general adult medical examination without abnormal findings: Secondary | ICD-10-CM | POA: Diagnosis not present

## 2019-10-24 DIAGNOSIS — Z1331 Encounter for screening for depression: Secondary | ICD-10-CM | POA: Diagnosis not present

## 2019-10-24 DIAGNOSIS — M8580 Other specified disorders of bone density and structure, unspecified site: Secondary | ICD-10-CM | POA: Diagnosis not present

## 2019-10-24 DIAGNOSIS — I25810 Atherosclerosis of coronary artery bypass graft(s) without angina pectoris: Secondary | ICD-10-CM | POA: Diagnosis not present

## 2019-10-24 DIAGNOSIS — I10 Essential (primary) hypertension: Secondary | ICD-10-CM | POA: Diagnosis not present

## 2019-10-24 DIAGNOSIS — R7301 Impaired fasting glucose: Secondary | ICD-10-CM | POA: Diagnosis not present

## 2019-10-24 DIAGNOSIS — I712 Thoracic aortic aneurysm, without rupture: Secondary | ICD-10-CM | POA: Diagnosis not present

## 2019-10-24 DIAGNOSIS — I700 Atherosclerosis of aorta: Secondary | ICD-10-CM | POA: Diagnosis not present

## 2019-10-24 DIAGNOSIS — E8881 Metabolic syndrome: Secondary | ICD-10-CM | POA: Diagnosis not present

## 2019-10-24 DIAGNOSIS — E785 Hyperlipidemia, unspecified: Secondary | ICD-10-CM | POA: Diagnosis not present

## 2019-10-24 DIAGNOSIS — N1831 Chronic kidney disease, stage 3a: Secondary | ICD-10-CM | POA: Diagnosis not present

## 2019-10-24 DIAGNOSIS — I739 Peripheral vascular disease, unspecified: Secondary | ICD-10-CM | POA: Diagnosis not present

## 2019-10-24 DIAGNOSIS — Z8679 Personal history of other diseases of the circulatory system: Secondary | ICD-10-CM | POA: Diagnosis not present

## 2019-10-31 ENCOUNTER — Emergency Department
Admission: EM | Admit: 2019-10-31 | Discharge: 2019-10-31 | Disposition: A | Discharge status: Home / Self Care | Payer: Medicare Other | Attending: Emergency Medicine | Admitting: Emergency Medicine

## 2019-10-31 ENCOUNTER — Encounter: Payer: Self-pay | Admitting: Emergency Medicine

## 2019-10-31 ENCOUNTER — Emergency Department: Payer: Medicare Other

## 2019-10-31 ENCOUNTER — Other Ambulatory Visit: Payer: Self-pay

## 2019-10-31 ENCOUNTER — Telehealth: Payer: Self-pay

## 2019-10-31 DIAGNOSIS — E1122 Type 2 diabetes mellitus with diabetic chronic kidney disease: Secondary | ICD-10-CM | POA: Insufficient documentation

## 2019-10-31 DIAGNOSIS — I4510 Unspecified right bundle-branch block: Secondary | ICD-10-CM | POA: Diagnosis not present

## 2019-10-31 DIAGNOSIS — Z7984 Long term (current) use of oral hypoglycemic drugs: Secondary | ICD-10-CM | POA: Insufficient documentation

## 2019-10-31 DIAGNOSIS — N183 Chronic kidney disease, stage 3 unspecified: Secondary | ICD-10-CM | POA: Insufficient documentation

## 2019-10-31 DIAGNOSIS — I129 Hypertensive chronic kidney disease with stage 1 through stage 4 chronic kidney disease, or unspecified chronic kidney disease: Secondary | ICD-10-CM | POA: Insufficient documentation

## 2019-10-31 DIAGNOSIS — Z951 Presence of aortocoronary bypass graft: Secondary | ICD-10-CM | POA: Diagnosis not present

## 2019-10-31 DIAGNOSIS — I2510 Atherosclerotic heart disease of native coronary artery without angina pectoris: Secondary | ICD-10-CM | POA: Diagnosis not present

## 2019-10-31 DIAGNOSIS — R1013 Epigastric pain: Secondary | ICD-10-CM | POA: Insufficient documentation

## 2019-10-31 DIAGNOSIS — R0789 Other chest pain: Secondary | ICD-10-CM | POA: Insufficient documentation

## 2019-10-31 DIAGNOSIS — R079 Chest pain, unspecified: Secondary | ICD-10-CM

## 2019-10-31 LAB — BASIC METABOLIC PANEL WITH GFR
Anion gap: 14 (ref 5–15)
BUN: 19 mg/dL (ref 8–23)
CO2: 23 mmol/L (ref 22–32)
Calcium: 10.1 mg/dL (ref 8.9–10.3)
Chloride: 100 mmol/L (ref 98–111)
Creatinine, Ser: 1.31 mg/dL — ABNORMAL HIGH (ref 0.61–1.24)
GFR calc Af Amer: 59 mL/min — ABNORMAL LOW
GFR calc non Af Amer: 51 mL/min — ABNORMAL LOW
Glucose, Bld: 99 mg/dL (ref 70–99)
Potassium: 4.4 mmol/L (ref 3.5–5.1)
Sodium: 137 mmol/L (ref 135–145)

## 2019-10-31 LAB — CBC
HCT: 43.1 % (ref 39.0–52.0)
Hemoglobin: 13.8 g/dL (ref 13.0–17.0)
MCH: 29.7 pg (ref 26.0–34.0)
MCHC: 32 g/dL (ref 30.0–36.0)
MCV: 92.7 fL (ref 80.0–100.0)
Platelets: 216 K/uL (ref 150–400)
RBC: 4.65 MIL/uL (ref 4.22–5.81)
RDW: 13.2 % (ref 11.5–15.5)
WBC: 9 K/uL (ref 4.0–10.5)
nRBC: 0 % (ref 0.0–0.2)

## 2019-10-31 LAB — TROPONIN I (HIGH SENSITIVITY)
Troponin I (High Sensitivity): 7 ng/L (ref <18)
Troponin I (High Sensitivity): 6 ng/L (ref <18)

## 2019-10-31 MED ORDER — SODIUM CHLORIDE 0.9% FLUSH: 3.0000 mL | Freq: Once | INTRAVENOUS | Status: DC

## 2019-10-31 NOTE — Telephone Encounter (Signed)
  Per secure chat received from Dr. Fletcher Anon. Please call this patient to schedule a Lexiscan for this week and a f/u appt after.  Once schedule the patient can be called with pre-test instructions.   Lattie Haw, please arrange for a Lexiscan Myoview this week and follow up after   [place] caregiver has ordered a Stress Test with nuclear imaging. The purpose of this test is to evaluate the blood supply to your heart muscle. This procedure is referred to as a "Non-Invasive Stress Test." This is because other than having an IV started in your vein, nothing is inserted or "invades" your body. Cardiac stress tests are done to find areas of poor blood flow to the heart by determining the extent of coronary artery disease (CAD). Some patients exercise on a treadmill, which naturally increases the blood flow to your heart, while others who are  unable to walk on a treadmill due to physical limitations have a pharmacologic/chemical stress agent called Lexiscan . This medicine will mimic walking on a treadmill by temporarily increasing your coronary blood flow.   Please note: these test may take anywhere between 2-4 hours to complete  PLEASE REPORT TO [facility] AT THE FIRST DESK WILL DIRECT YOU WHERE TO GO  Date of Procedure:_____________________________________  Arrival Time for Procedure:______________________________  PLEASE NOTIFY THE OFFICE AT LEAST 24 HOURS IN ADVANCE IF YOU ARE UNABLE TO KEEP YOUR APPOINTMENT.  [phone] AND  PLEASE NOTIFY NUCLEAR MEDICINE AT [facility] AT LEAST 24 HOURS IN ADVANCE IF YOU ARE UNABLE TO KEEP YOUR APPOINTMENT. [phone]  How to prepare for your Myoview test:  1. Do not eat or drink after midnight 2. No caffeine for 24 hours prior to test 3. No smoking 24 hours prior to test. 4. Your medication may be taken with water.  If your doctor stopped a medication because of this test, do not take that medication. 5. Ladies,  please do not wear dresses.  Skirts or pants are appropriate. Please wear a short sleeve shirt. 6. No perfume, cologne or lotion. 7. Wear comfortable walking shoes. No heels!

## 2019-10-31 NOTE — ED Triage Notes (Signed)
Pt awake and alert, talking in complete coherent sentences; pt reports pain to the center lower chest, just up under his ribcage area; pt has a cardiac history and says that's always where his pain is located; non-radiating; denies N/V; denies shortness of breath; denies diaphoresis; reports HTN for the past week;

## 2019-10-31 NOTE — ED Notes (Signed)
Pt ambulated to toilet with steady gait

## 2019-10-31 NOTE — ED Provider Notes (Signed)
[facility] Emergency Department Provider Note       Time seen: ----------------------------------------- 1:54 PM on 10/31/2019 -----------------------------------------   I have reviewed the triage vital signs and the nursing notes.  HISTORY   Chief Complaint Chest Pain    HPI Thomas Fuentes is a 79 y.o. male with a history of AAA, chronic renal disease, coronary artery disease, hyperlipidemia, hypertension, hypothyroidism, MI who presents to the ED for lower chest and epigastric discomfort that he states is like his symptoms were prior to his open surgery and two-vessel stenting prior to that.  In 2000 he states he had 2 stents with symptoms like this followed by 18 years of no symptoms.  In 2018 he had the same symptoms which resulted in stent collapse resulting in open heart surgery.  He describes the same symptoms today.  Denies any sweats, nausea or shortness of breath.  Past Medical History:  Diagnosis Date  . AAA (abdominal aortic aneurysm) ([place])   . Chronic renal disease, stage III   . Coronary artery disease    a. MI 2000 s/p PCI and 2 stent placement to unknown arteries;  LHC 08/09/2016 o-pLAD 30%, mLAD 99%, OM2 50%, OM3 85%, pRCA 99%, mid RCA 60%, RPDA 60%, LVEDP mod elevated. CABG in 07/2016 :  LIMA to LAD, SVG to RCA and SVG to distal left circumflex  . Diabetes mellitus without complication ([place])   . Dyslipidemia   . Hyperlipidemia   . Hypertension   . Hypothyroidism   . Macular degeneration    bilateral  . Metabolic syndrome   . MI (myocardial infarction) ([place])   . Peripheral neuropathy   . Peripheral vascular disease [facility])     Patient Active Problem List   Diagnosis Date Noted  . Diabetes mellitus ([place]) 08/13/2016  . S/P CABG x 4 08/11/2016  . NSTEMI (non-ST elevated myocardial infarction) ([place]) 08/09/2016  . CKD (chronic kidney disease), stage III 08/09/2016  . AAA (abdominal aortic aneurysm) ([place]) 08/09/2016  . Hypothyroidism  08/09/2016  . Coronary artery disease   . Hyperlipidemia   . Hypertension   . NONSPECIFIC ABN FINDING RAD & OTH EXAM GI TRACT 02/18/2009    Past Surgical History:  Procedure Laterality Date  . CARDIAC CATHETERIZATION  2000   [place] x2 stents  . CARDIAC CATHETERIZATION N/A 08/09/2016   Procedure: Left Heart Cath and Coronary Angiography;  Surgeon: Wellington Hampshire, MD;  Location: [place] [facility];  Service: Cardiovascular;  Laterality: N/A;  . CORONARY ARTERY BYPASS GRAFT N/A 08/11/2016   Procedure: CORONARY ARTERY BYPASS GRAFTING on pump using left internal mammary artery to left anterior descending coronary artery , portion of right greater saphenous vein to right coronary artery, portion of right greater saphenous vein graft to distal circumflex.;  Surgeon: [name], MD;  Location: [place];  Service: Open Heart Surgery;  Laterality: N/A;  . CORONARY STENT PLACEMENT    . ENDOVEIN HARVEST OF GREATER SAPHENOUS VEIN Right 08/11/2016   Procedure: ENDOVEIN HARVEST OF GREATER SAPHENOUS VEIN;  Surgeon: [name], MD;  Location: [place];  Service: Open Heart Surgery;  Laterality: Right;  . TEE WITHOUT CARDIOVERSION N/A 08/11/2016   Procedure: TRANSESOPHAGEAL ECHOCARDIOGRAM (TEE);  Surgeon: [name], MD;  Location: [place];  Service: Open Heart Surgery;  Laterality: N/A;    Allergies Lidocaine, Quinolones, and Statins  Social History Social History   Tobacco Use  . Smoking status: Former Smoker    Years: 40.00  Types: Cigarettes    Quit date: 02/06/1999    Years since quitting: 20.7  . Smokeless tobacco: Never Used  Substance Use Topics  . Alcohol use: Yes    Comment: occasional  . Drug use: No    Review of Systems Constitutional: Negative for fever. Cardiovascular: Positive for chest pain Respiratory: Negative for shortness of breath. Gastrointestinal: Negative for abdominal pain, vomiting and diarrhea. Musculoskeletal: Negative for back pain. Skin:  Negative for rash. Neurological: Negative for headaches, focal weakness or numbness.  All systems negative/normal/unremarkable except as stated in the HPI  ____________________________________________   PHYSICAL EXAM:  VITAL SIGNS: ED Triage Vitals  Enc Vitals Group     BP 10/31/19 1233 (!) 178/81     Pulse Rate 10/31/19 1233 60     Resp 10/31/19 1233 17     Temp 10/31/19 1233 98.1 F (36.7 C)     Temp Source 10/31/19 1233 Oral     SpO2 10/31/19 1233 94 %     Weight 10/31/19 1231 154 lb 15.7 oz (70.3 kg)     Height 10/31/19 1231 5\' 7"  (1.702 m)     Head Circumference --      Peak Flow --      Pain Score 10/31/19 1230 3     Pain Loc --      Pain Edu? --      Excl. in [place]? --     Constitutional: Alert and oriented. Well appearing and in no distress. Eyes: Conjunctivae are normal. Normal extraocular movements. ENT      Head: Normocephalic and atraumatic.      Nose: No congestion/rhinnorhea.      Mouth/Throat: Mucous membranes are moist.      Neck: No stridor. Cardiovascular: Normal rate, regular rhythm. No murmurs, rubs, or gallops. Respiratory: Normal respiratory effort without tachypnea nor retractions. Breath sounds are clear and equal bilaterally. No wheezes/rales/rhonchi. Gastrointestinal: Soft and nontender. Normal bowel sounds Musculoskeletal: Nontender with normal range of motion in extremities. No lower extremity tenderness nor edema. Neurologic:  Normal speech and language. No gross focal neurologic deficits are appreciated.  Skin:  Skin is warm, dry and intact. No rash noted. Psychiatric: Mood and affect are normal. Speech and behavior are normal.  ____________________________________________  EKG: Interpreted by me.  Sinus rhythm rate of 68 bpm, first-degree AV block, incomplete right bundle branch block, normal axis, normal QT  ____________________________________________  ED COURSE:  As part of my medical decision making, I reviewed the following data  within the [place] History obtained from family if available, nursing notes, old chart and ekg, as well as notes from prior ED visits. Patient presented for chest pain like prior MI, we will assess with labs and imaging as indicated at this time.   Procedures  Thomas Fuentes was evaluated in Emergency Department on 10/31/2019 for the symptoms described in the history of present illness. He was evaluated in the context of the global COVID-19 pandemic, which necessitated consideration that the patient might be at risk for infection with the SARS-CoV-2 virus that causes COVID-19. Institutional protocols and algorithms that pertain to the evaluation of patients at risk for COVID-19 are in a state of rapid change based on information released by regulatory bodies including the CDC and federal and state organizations. These policies and algorithms were followed during the patient's care in the ED.  ____________________________________________   LABS (pertinent positives/negatives)  Labs Reviewed  BASIC METABOLIC PANEL - Abnormal; Notable for the following components:  Result Value   Creatinine, Ser 1.31 (*)    GFR calc non Af Amer 51 (*)    GFR calc Af Amer 59 (*)    All other components within normal limits  CBC  TROPONIN I (HIGH SENSITIVITY)    RADIOLOGY Images were viewed by me  Chest x-ray IMPRESSION: No acute disease. ____________________________________________   DIFFERENTIAL DIAGNOSIS   Unstable angina, MI, PE, dissection, GERD, peptic ulcer disease  FINAL ASSESSMENT AND PLAN  Chest pain   Plan: The patient had presented for chest pain like prior MI. Patient's labs were unremarkable. Patient's imaging does not reveal any acute process.  Patient discussed with his cardiologist, he will have outpatient Lexiscan Myoview this week.  Patient is agreeable to this plan.   Laurence Aly, MD    Note: This note was generated in part or whole with voice  recognition software. Voice recognition is usually quite accurate but there are transcription errors that can and very often do occur. I apologize for any typographical errors that were not detected and corrected.     Earleen Newport, MD 10/31/19 1450

## 2019-10-31 NOTE — Telephone Encounter (Signed)
Patient scheduled 3/5 for test and 3/11 with C. Sharolyn Douglas for follow up

## 2019-10-31 NOTE — Telephone Encounter (Signed)
Unable to LVM due to mailbox being full.

## 2019-11-01 NOTE — Telephone Encounter (Signed)
Called the patient to review pre-test instruction for his upcoming [place]. Unable to lmom patients voicemail is full.

## 2019-11-01 NOTE — Telephone Encounter (Addendum)
Spoke with the patient. Patient made aware of pre-test instructions with verbalized understanding.

## 2019-11-02 ENCOUNTER — Encounter
Admission: RE | Admit: 2019-11-02 | Discharge: 2019-11-02 | Disposition: A | Discharge status: Home / Self Care | Payer: Medicare Other | Source: Ambulatory Visit | Attending: Cardiovascular Disease | Admitting: Cardiovascular Disease

## 2019-11-02 ENCOUNTER — Other Ambulatory Visit: Payer: Self-pay

## 2019-11-02 DIAGNOSIS — R079 Chest pain, unspecified: Secondary | ICD-10-CM | POA: Insufficient documentation

## 2019-11-02 LAB — NM MYOCAR MULTI W/SPECT W/WALL MOTION / EF
LV dias vol: 57 mL (ref 62–150)
LV sys vol: 17 mL
Peak HR: 85 {beats}/min
Percent HR: 60 %
Rest HR: 58 {beats}/min
TID: 1.05

## 2019-11-02 MED ORDER — TECHNETIUM TC 99M TETROFOSMIN IV KIT
30.1800 | PACK | Freq: Once | INTRAVENOUS | Status: AC | PRN
  Administered 2019-11-02: 11:00:00 30.18 via INTRAVENOUS

## 2019-11-02 MED ORDER — TECHNETIUM TC 99M TETROFOSMIN IV KIT
10.0000 | PACK | Freq: Once | INTRAVENOUS | Status: AC | PRN
  Administered 2019-11-02: 10:00:00 9.72 via INTRAVENOUS

## 2019-11-02 MED ORDER — REGADENOSON 0.4 MG/5ML IV SOLN
0.4000 mg | Freq: Once | INTRAVENOUS | Status: AC
  Administered 2019-11-02: 11:00:00 0.4 mg via INTRAVENOUS

## 2019-11-08 ENCOUNTER — Ambulatory Visit (INDEPENDENT_AMBULATORY_CARE_PROVIDER_SITE_OTHER): Payer: Medicare Other | Admitting: Nurse Practitioner

## 2019-11-08 ENCOUNTER — Encounter: Payer: Self-pay | Admitting: Nurse Practitioner

## 2019-11-08 ENCOUNTER — Other Ambulatory Visit: Payer: Self-pay

## 2019-11-08 VITALS — BP 124/60 | HR 67 | Ht 67.0 in | Wt 157.2 lb

## 2019-11-08 DIAGNOSIS — I712 Thoracic aortic aneurysm, without rupture: Secondary | ICD-10-CM

## 2019-11-08 DIAGNOSIS — I25119 Atherosclerotic heart disease of native coronary artery with unspecified angina pectoris: Secondary | ICD-10-CM

## 2019-11-08 DIAGNOSIS — R079 Chest pain, unspecified: Secondary | ICD-10-CM | POA: Diagnosis not present

## 2019-11-08 DIAGNOSIS — I714 Abdominal aortic aneurysm, without rupture, unspecified: Secondary | ICD-10-CM

## 2019-11-08 DIAGNOSIS — E785 Hyperlipidemia, unspecified: Secondary | ICD-10-CM

## 2019-11-08 DIAGNOSIS — I10 Essential (primary) hypertension: Secondary | ICD-10-CM

## 2019-11-08 DIAGNOSIS — I7121 Aneurysm of the ascending aorta, without rupture: Secondary | ICD-10-CM

## 2019-11-08 NOTE — Patient Instructions (Signed)
Medication Instructions:  Your physician recommends that you continue on your current medications as directed. Please refer to the Current Medication list given to you today.  *If you need a refill on your cardiac medications before your next appointment, please call your pharmacy*   Lab Work: None ordered  If you have labs (blood work) drawn today and your tests are completely normal, you will receive your results only by: . MyChart Message (if you have MyChart) OR . A paper copy in the mail If you have any lab test that is abnormal or we need to change your treatment, we will call you to review the results.   Testing/Procedures: None ordered    Follow-Up: At [facility], you and your health needs are our priority.  As part of our continuing mission to provide you with exceptional heart care, we have created designated Provider Care Teams.  These Care Teams include your primary Cardiologist (physician) and Advanced Practice Providers (APPs -  Physician Assistants and Nurse Practitioners) who all work together to provide you with the care you need, when you need it.  We recommend signing up for the patient portal called "MyChart".  Sign up information is provided on this After Visit Summary.  MyChart is used to connect with patients for Virtual Visits (Telemedicine).  Patients are able to view lab/test results, encounter notes, upcoming appointments, etc.  Non-urgent messages can be sent to your provider as well.   To learn more about what you can do with MyChart, go to [email or web address].com.    Your next appointment:   12 month(s)  The format for your next appointment:   In Person  Provider:    You may see Muhammad Arida, MD or Christopher Berge, NP 

## 2019-11-08 NOTE — Progress Notes (Signed)
Office Visit    Patient Name: Thomas Fuentes Date of Encounter: 11/08/2019  Primary Care Provider:  Marton Redwood, MD Primary Cardiologist:  Kathlyn Sacramento, MD  Chief Complaint    79 year old male with a history of coronary artery disease status post non-STEMI and CABG in December 2017, hypertension, hyperlipidemia, type 2 diabetes mellitus, small ascending aortic and abdominal aneurysms, and stage III chronic kidney disease who presents for follow-up after recent ER visit and nonischemic stress testing.  Past Medical History    Past Medical History:  Diagnosis Date  . AAA (abdominal aortic aneurysm) ([place])   . Chronic renal disease, stage III   . Coronary artery disease    a. MI 2000 s/p PCI and 2 stent placement to unknown arteries;  b. LHC 08/09/2016 o-pLAD 30%, mLAD 99%, OM2 50%, OM3 85%, pRCA 99%, mid RCA 60%, RPDA 60%, LVEDP mod elevated. CABG in 07/2016 :  LIMA to LAD, SVG to RCA , SVG to LCX; c. 10/2019 MV: No ischemia or scar. Low risk.  . Diabetes mellitus without complication ([place])   . Dyslipidemia   . Hyperlipidemia   . Hypertension   . Hypothyroidism   . Macular degeneration    bilateral  . Metabolic syndrome   . MI (myocardial infarction) ([place])   . Peripheral neuropathy   . Peripheral vascular disease [facility])    Past Surgical History:  Procedure Laterality Date  . CARDIAC CATHETERIZATION  2000   [place] x2 stents  . CARDIAC CATHETERIZATION N/A 08/09/2016   Procedure: Left Heart Cath and Coronary Angiography;  Surgeon: Wellington Hampshire, MD;  Location: [place] [facility];  Service: Cardiovascular;  Laterality: N/A;  . CORONARY ARTERY BYPASS GRAFT N/A 08/11/2016   Procedure: CORONARY ARTERY BYPASS GRAFTING on pump using left internal mammary artery to left anterior descending coronary artery , portion of right greater saphenous vein to right coronary artery, portion of right greater saphenous vein graft to distal circumflex.;  Surgeon: [name], MD;   Location: [place];  Service: Open Heart Surgery;  Laterality: N/A;  . CORONARY STENT PLACEMENT    . ENDOVEIN HARVEST OF GREATER SAPHENOUS VEIN Right 08/11/2016   Procedure: ENDOVEIN HARVEST OF GREATER SAPHENOUS VEIN;  Surgeon: [name], MD;  Location: [place];  Service: Open Heart Surgery;  Laterality: Right;  . TEE WITHOUT CARDIOVERSION N/A 08/11/2016   Procedure: TRANSESOPHAGEAL ECHOCARDIOGRAM (TEE);  Surgeon: [name], MD;  Location: [place];  Service: Open Heart Surgery;  Laterality: N/A;    Allergies  Allergies  Allergen Reactions  . Lidocaine Anaphylaxis    novacaine and lidocaine  . Quinolones   . Statins Nausea And Vomiting    GI symptoms on almost all statins    History of Present Illness    79 year old male with a history of coronary artery disease status post non-STEMI and CABG in December 2017, hypertension, hyperlipidemia, type 2 diabetes mellitus, small ascending aortic and abdominal aneurysms, and stage III chronic kidney disease.  Cardiac history dates back to the year 2000, when he suffered an MI and underwent PCI and stent placement.  Unfortunately, he suffered a non-STEMI December 2017 and underwent diagnostic catheterization revealing severe mid LAD, OM 3, and RCA disease.  Subsequently underwent CABG x3.  He had done well from a cardiac standpoint since then and has been followed by CT surgery in the setting of a 4.4 x 4.2 ascending thoracic aortic fusiform aneurysm (10/2019).  He is also known to have a 3.1  cm infrarenal abdominal aortic aneurysm (05/2019).  He was last seen in clinic in August 2020, at which time he was doing well.  He is very active and frequently works in his yard and does not typically experience any symptoms or limitations.  On March 3, he noted lower sternal/epigastric "discomfort" which he identifies being similar to prior angina.  There is a lot of belching associated with this.  He took an antacid with some improvement.  Because of  persistent symptoms, he presented to the ER where ECG was nonacute and high-sensitivity troponin was normal.  He was discharged from the ER and we set him up to have a [place] which was performed on March 5, and was low risk without evidence of ischemia.  He has not had any recurrence of chest/epigastric discomfort since last Wednesday.  He denies dyspnea, palpitations, PND, orthopnea, dizziness, syncope, edema, or early satiety.  He is scheduled for CT angiography of his chest to reevaluate ascending aortic aneurysm on March 18 with CT surgery follow-up immediately thereafter.  Home Medications    Prior to Admission medications   Medication Sig Start Date End Date Taking? Authorizing Provider  aspirin EC 81 MG EC tablet Take 1 tablet (81 mg total) by mouth daily. 08/16/16  Yes Gold, Patrick Jupiter E, PA-C  ezetimibe (ZETIA) 10 MG tablet Take 1 tablet (10 mg total) by mouth daily. 08/16/16  Yes Gold, Wayne E, PA-C  fenofibrate 160 MG tablet Take 160 mg by mouth.  02/02/17  Yes [provider]  gabapentin (NEURONTIN) 600 MG tablet Take 600 mg by mouth 3 (three) times daily.   Yes [provider]  levothyroxine (SYNTHROID) 50 MCG tablet Take 50 mcg by mouth daily. 10/24/19  Yes [provider]  lisinopril (ZESTRIL) 5 MG tablet Take 1 tablet by mouth once daily Patient taking differently: Take 5 mg by mouth daily.  04/16/19  Yes Wellington Hampshire, MD  multivitamin-lutein (OCUVITE-LUTEIN) CAPS capsule Take 1 capsule by mouth 2 (two) times daily.   Yes [provider]  rosuvastatin (CRESTOR) 5 MG tablet Take 1 tablet by mouth once daily Patient taking differently: Take 5 mg by mouth daily at 6 PM.  06/07/19  Yes Wellington Hampshire, MD  sildenafil (REVATIO) 20 MG tablet Take 60-80 mg by mouth as directed. 08/10/19  Yes [provider]    Review of Systems    Episodic lower sternal/epigastric chest discomfort on March 3 with negative ER evaluation.  No further  chest discomfort.  He denies dyspnea, palpitations, PND, orthopnea, dizziness, syncope, edema, early satiety, or palpitations.  All other systems reviewed and are otherwise negative except as noted above.  Physical Exam    VS:  BP 124/60 (BP Location: Left Arm, Patient Position: Sitting, Cuff Size: Normal)   Pulse 67   Ht 5\' 7"  (1.702 m)   Wt 157 lb 4 oz (71.3 kg)   SpO2 97%   BMI 24.63 kg/m  , BMI Body mass index is 24.63 kg/m. GEN: Well nourished, well developed, in no acute distress. HEENT: normal. Neck: Supple, no JVD, carotid bruits, or masses. Cardiac: RRR, 1/6 stock murmur at the upper sternal borders, no rubs, or gallops. No clubbing, cyanosis, edema.  Radials/PT 2+ and equal bilaterally.  Respiratory:  Respirations regular and unlabored, clear to auscultation bilaterally. GI: Soft, nontender, nondistended, BS + x 4. MS: no deformity or atrophy. Skin: warm and dry, no rash. Neuro:  Strength and sensation are intact. Psych: Normal affect.  Accessory  Clinical Findings    ECG personally reviewed by me today -regular sinus rhythm, 67- no acute changes.  Lab Results  Component Value Date   WBC 9.0 10/31/2019   HGB 13.8 10/31/2019   HCT 43.1 10/31/2019   MCV 92.7 10/31/2019   PLT 216 10/31/2019   Lab Results  Component Value Date   CREATININE 1.31 (H) 10/31/2019   BUN 19 10/31/2019   NA 137 10/31/2019   K 4.4 10/31/2019   CL 100 10/31/2019   CO2 23 10/31/2019   Lab Results  Component Value Date   ALT 12 (L) 01/05/2017   AST 20 01/05/2017   ALKPHOS 36 (L) 01/05/2017   BILITOT 0.8 01/05/2017   Lab Results  Component Value Date   CHOL 111 01/05/2017   HDL 38 (L) 01/05/2017   LDLCALC 53 01/05/2017   TRIG 102 01/05/2017   CHOLHDL 2.9 01/05/2017    Lab Results  Component Value Date   HGBA1C 6.0 (H) 08/10/2016    Assessment & Plan    1.  Coronary artery disease/chest pain: Patient with prior history of stenting and then non-STEMI with CABG x3 in 2017.  He  had been doing well but noted episodic, mild lower sternal/epigastric chest discomfort on March 3 prompting him to present to the emergency department.  Symptoms were associated with belching and seem to improve with antacid.  Work-up in the ER was unremarkable with nonacute ECG and normal high-sensitivity troponins.  He was subsequently discharged and underwent Lexiscan Myoview on March 5, which was low risk, without evidence for ischemia.  He has had no recurrence of symptoms.  He remains on aspirin, Zetia, fenofibrate, lisinopril, and rosuvastatin therapy.  He will use antacids as needed.  No further ischemic evaluation warranted.  2.  Essential hypertension: Stable on lisinopril therapy.  3.  Hyperlipidemia: He is on low-dose rosuvastatin along with fenofibrate and Zetia.  His primary care provider follows his lipids and he said he recently had labs drawn and was told that everything looked okay.  He previously did not tolerate higher doses of statins.  4.  Ascending aortic aneurysm: 4.4 x 4.2 cm on CT angiography last year.  He is scheduled for follow-up CT on March 18.  Blood pressure controlled on ACE inhibitor.  5.  Abdominal aortic aneurysm: Ultrasound in September 2020 showed a 3.1 cm infrarenal abdominal aortic aneurysm, which was stable.  He will be due for follow-up in September of this year.  6.  Disposition: Patient prefers to stay on an annual follow-up schedule.  He knows to contact us if any issues crop up in the interim.  Murray Hodgkins, NP 11/08/2019, 4:04 PM

## 2019-11-15 ENCOUNTER — Other Ambulatory Visit: Payer: Self-pay

## 2019-11-15 ENCOUNTER — Ambulatory Visit (INDEPENDENT_AMBULATORY_CARE_PROVIDER_SITE_OTHER): Payer: Medicare Other | Admitting: Cardiothoracic Surgery

## 2019-11-15 ENCOUNTER — Ambulatory Visit
Admission: RE | Admit: 2019-11-15 | Discharge: 2019-11-15 | Disposition: A | Discharge status: Home / Self Care | Payer: Medicare Other | Source: Ambulatory Visit | Attending: Cardiothoracic Surgery | Admitting: Cardiothoracic Surgery

## 2019-11-15 VITALS — BP 142/79 | HR 60 | Temp 97.3°F | Resp 20 | Ht 67.0 in | Wt 154.0 lb

## 2019-11-15 DIAGNOSIS — I712 Thoracic aortic aneurysm, without rupture, unspecified: Secondary | ICD-10-CM

## 2019-11-15 DIAGNOSIS — I25119 Atherosclerotic heart disease of native coronary artery with unspecified angina pectoris: Secondary | ICD-10-CM

## 2019-11-15 MED ORDER — IOPAMIDOL (ISOVUE-370) INJECTION 76%
75.0000 mL | Freq: Once | INTRAVENOUS | Status: AC | PRN
  Administered 2019-11-15: 75 mL via INTRAVENOUS

## 2019-11-15 NOTE — Progress Notes (Signed)
[name]       [place] [address]             [phone]                    [name] [place] Medical Record [id number] Date of Birth: [birth year]  Referring: Marton Redwood, MD Primary Care: Marton Redwood, MD Primary Cardiologist: Kathlyn Sacramento, MD  Chief Complaint:    Chief Complaint  Patient presents with  . Follow-up    F/U dilated ascending aorta w/ CTA today   DATE OF PROCEDURE:  08/11/2016   OPERATIVE REPORT PREOPERATIVE DIAGNOSES:  Coronary occlusive disease with unstable angina. POSTOPERATIVE DIAGNOSIS:  Coronary occlusive disease with unstable angina. PROCEDURE PERFORMED:  Coronary artery bypass grafting x4 with left internal mammary to the left anterior descending coronary artery, sequential reverse saphenous vein graft to the first obtuse marginal and distal circumflex, reverse saphenous vein graft to the posterior descending coronary artery with endoscopic right greater saphenous vein harvesting, thigh and calf.  History of Present Illness:    Thomas Fuentes 79 y.o. male is seen in the office  today for a follow-up CT of the chest for evaluation of his ascending aorta.     Prior to surgery done in 2017  A CT scan was done on the chest, which demonstrated mild enlargement of his ascending aorta with maximum dimension of approximately 4.2 cm with evidence of a trileaflet aortic valve.   Patient had a follow-up CT scan in February 2020 that showed slight change to 4.4 cm compared to 2019 and 2017 .  March 3 the patient had an episode of epigastric pain which he thought was similar to his anginal equivalent previously he was seen in the emergency room, troponins and EKG were negative, no consideration for the size of his aorta has been an issue was entertained and he was discharged home follow-up Myoview in the cardiology office was low risk.  He has had no recurrent symptoms of epigastric pain.  He notes his 79st birthday is  coming up next week   Current Activity/ Functional Status:  Patient is independent with mobility/ambulation, transfers, ADL's, IADL's.   Zubrod Score: At the time of surgery this patient's most appropriate activity status/level should be described as: [x]     0    Normal activity, no symptoms []     1    Restricted in physical strenuous activity but ambulatory, able to do out light work []     2    Ambulatory and capable of self care, unable to do work activities, up and about               >50 % of waking hours                              []     3    Only limited self care, in bed greater than 50% of waking hours []     4    Completely disabled, no self care, confined to bed or chair []     5    Moribund   Past Medical History:  Diagnosis Date  . AAA (abdominal aortic aneurysm) ([place])   . Chronic renal disease, stage III   . Coronary artery disease    a. MI 2000 s/p PCI and 2 stent placement to unknown arteries;  b. LHC 08/09/2016 o-pLAD 30%, mLAD 99%,  OM2 50%, OM3 85%, pRCA 99%, mid RCA 60%, RPDA 60%, LVEDP mod elevated. CABG in 07/2016 :  LIMA to LAD, SVG to RCA , SVG to LCX; c. 10/2019 MV: No ischemia or scar. Low risk.  . Diabetes mellitus without complication ([place])   . Dyslipidemia   . Hyperlipidemia   . Hypertension   . Hypothyroidism   . Macular degeneration    bilateral  . Metabolic syndrome   . MI (myocardial infarction) ([place])   . Peripheral neuropathy   . Peripheral vascular disease [facility])     Past Surgical History:  Procedure Laterality Date  . CARDIAC CATHETERIZATION  2000   [place] x2 stents  . CARDIAC CATHETERIZATION N/A 08/09/2016   Procedure: Left Heart Cath and Coronary Angiography;  Surgeon: Wellington Hampshire, MD;  Location: [place] [facility];  Service: Cardiovascular;  Laterality: N/A;  . CORONARY ARTERY BYPASS GRAFT N/A 08/11/2016   Procedure: CORONARY ARTERY BYPASS GRAFTING on pump using left internal mammary artery to left anterior descending coronary artery  , portion of right greater saphenous vein to right coronary artery, portion of right greater saphenous vein graft to distal circumflex.;  Surgeon: [name], MD;  Location: [place];  Service: Open Heart Surgery;  Laterality: N/A;  . CORONARY STENT PLACEMENT    . ENDOVEIN HARVEST OF GREATER SAPHENOUS VEIN Right 08/11/2016   Procedure: ENDOVEIN HARVEST OF GREATER SAPHENOUS VEIN;  Surgeon: [name], MD;  Location: [place];  Service: Open Heart Surgery;  Laterality: Right;  . TEE WITHOUT CARDIOVERSION N/A 08/11/2016   Procedure: TRANSESOPHAGEAL ECHOCARDIOGRAM (TEE);  Surgeon: [name], MD;  Location: [place];  Service: Open Heart Surgery;  Laterality: N/A;    Family History  Problem Relation Age of Onset  . Hypertension Mother   . Prostate cancer Father   . Diabetes Sister      Social History   Tobacco Use  Smoking Status Former Smoker  . Years: 40.00  . Types: Cigarettes  . Quit date: 02/06/1999  . Years since quitting: 20.7  Smokeless Tobacco Never Used    Social History   Substance and Sexual Activity  Alcohol Use Yes   Comment: occasional     Allergies  Allergen Reactions  . Lidocaine Anaphylaxis    novacaine and lidocaine  . Quinolones   . Statins Nausea And Vomiting    GI symptoms on almost all statins    Current Outpatient Medications  Medication Sig Dispense Refill  . aspirin EC 81 MG EC tablet Take 1 tablet (81 mg total) by mouth daily.    . Cholecalciferol (VITAMIN D3 PO) Take 6,000 Units by mouth. Taking 1 capsule daily    . ezetimibe (ZETIA) 10 MG tablet Take 1 tablet (10 mg total) by mouth daily. 30 tablet 1  . fenofibrate 160 MG tablet Take 160 mg by mouth.     . gabapentin (NEURONTIN) 600 MG tablet Take 600 mg by mouth 3 (three) times daily.    Marland Kitchen levothyroxine (SYNTHROID) 50 MCG tablet Take 50 mcg by mouth daily.    Marland Kitchen lisinopril (ZESTRIL) 5 MG tablet Take 1 tablet by mouth once daily (Patient taking differently: Take 5 mg by mouth daily.  ) 90 tablet 3  . Multiple Vitamins-Minerals (ZINC PO) Take by mouth. Taking 2 capsules daily    . multivitamin-lutein (OCUVITE-LUTEIN) CAPS capsule Take 1 capsule by mouth 2 (two) times daily.    . rosuvastatin (CRESTOR) 5 MG tablet Take 1 tablet by mouth once daily (  Patient taking differently: Take 5 mg by mouth daily at 6 PM. ) 90 tablet 2  . sildenafil (REVATIO) 20 MG tablet Take 60-80 mg by mouth as directed.     No current facility-administered medications for this visit.    Pertinent items are noted in HPI.   Review of Systems:     Cardiac Review of Systems: [Y] = yes  or   [ N ] = no   Chest Pain [   Single episode 10/31/2019  ]  Resting SOB [n  ] Exertional SOB  [n  ]  Orthopnea Florencio.Farrier  ]   Pedal Edema [ n  ]    Palpitations [ n ] Syncope  [ n ]   Presyncope [ n  ]   General Review of Systems: [Y] = yes [  ]=no Constitional: recent weight change [  ];  Wt loss over the last 3 months [   ] anorexia [  ]; fatigue [  ]; nausea [  ]; night sweats [  ]; fever [  ]; or chills [  ];           Eye : blurred vision [  ]; diplopia [   ]; vision changes [  ];  Amaurosis fugax[  ]; Resp: cough [  ];  wheezing[  ];  hemoptysis[  ]; shortness of breath[  ]; paroxysmal nocturnal dyspnea[  ]; dyspnea on exertion[  ]; or orthopnea[  ];  GI:  gallstones[  ], vomiting[  ];  dysphagia[  ]; melena[  ];  hematochezia [  ]; heartburn[  ];   Hx of  Colonoscopy[  ]; GU: kidney stones [  ]; hematuria[  ];   dysuria [  ];  nocturia[  ];  history of     obstruction [  ]; urinary frequency [  ]             Skin: rash, swelling[  ];, hair loss[  ];  peripheral edema[  ];  or itching[  ]; Musculosketetal: myalgias[  ];  joint swelling[  ];  joint erythema[  ];  joint pain[  ];  back pain[  ];  Heme/Lymph: bruising[  ];  bleeding[  ];  anemia[  ];  Neuro: TIA[  ];  headaches[  ];  stroke[  ];  vertigo[  ];  seizures[  ];   paresthesias[  ];  difficulty walking[  ];  Psych:depression[  ]; anxiety[  ];  Endocrine:  diabetes[  ];  thyroid dysfunction[  ];  Immunizations: Flu up to date Blue.Reese  ]; Pneumococcal up to date Blue.Reese  ];  Other:     PHYSICAL EXAMINATION: BP (!) 142/79 (BP Location: Right Arm, Patient Position: Sitting, Cuff Size: Normal)   Pulse 60   Temp (!) 97.3 F (36.3 C) (Temporal)   Resp 20   Ht 5\' 7"  (1.702 m)   Wt 69.9 kg   SpO2 97% Comment: RA  BMI 24.12 kg/m  General appearance: alert, cooperative and no distress Head: Normocephalic, without obvious abnormality, atraumatic Neck: no adenopathy, no carotid bruit, no JVD, supple, symmetrical, trachea midline and thyroid not enlarged, symmetric, no tenderness/mass/nodules Lymph nodes: Cervical, supraclavicular, and axillary nodes normal. Resp: clear to auscultation bilaterally Cardio: regular rate and rhythm, S1, S2 normal, no murmur, click, rub or gallop GI: soft, non-tender; bowel sounds normal; no masses,  no organomegaly Extremities: extremities normal, atraumatic, no cyanosis or edema Neurologic: Grossly normal  Diagnostic Studies & Laboratory data:  Recent Radiology Findings:  NM Myocar Multi W/Spect W/Wall Motion / EF  Result Date: 11/02/2019  Normal pharmacologic myocardial perfusion stress test without significant ischemia or scar.  The left ventricular ejection fraction is normal by visual estimation and Siemens calculation.  Attenuation correction CT shows post-CABG findings as well as aortic atherosclerosis and coronary artery calcification.  This is a low risk study.    CT ANGIO CHEST AORTA W/CM & OR WO/CM  Result Date: 11/15/2019 CLINICAL DATA:  Thoracic aortic aneurysm. EXAM: CT ANGIOGRAPHY CHEST WITH CONTRAST TECHNIQUE: Multidetector CT imaging of the chest was performed using the standard protocol during bolus administration of intravenous contrast. Multiplanar CT image reconstructions and MIPs were obtained to evaluate the vascular anatomy. CONTRAST:  49mL ISOVUE-370 IOPAMIDOL (ISOVUE-370) INJECTION 76%  COMPARISON:  October 23, 2018. FINDINGS: Cardiovascular: Status post coronary bypass graft. Grossly stable 4.3 cm ascending thoracic aortic aneurysm is noted. Transverse aortic arch measures 2.9 cm. Proximal descending thoracic aorta measures 3.0 cm. Atherosclerosis of thoracic aorta is noted without dissection. Great vessels are widely patent. Stable cardiomegaly. No pericardial effusion. Aortic root is mildly dilated at 4.4 cm. Mediastinum/Nodes: No enlarged mediastinal, hilar, or axillary lymph nodes. Thyroid gland, trachea, and esophagus demonstrate no significant findings. Lungs/Pleura: No pneumothorax or pleural effusion is noted. Mild emphysematous disease is noted in the upper lobes. No acute pulmonary abnormality is noted. Upper Abdomen: No acute abnormality. Musculoskeletal: No chest wall abnormality. No acute or significant osseous findings. Review of the MIP images confirms the above findings. IMPRESSION: 1. Grossly stable 4.3 cm ascending thoracic aortic aneurysm. Recommend annual imaging followup by CTA or MRA. This recommendation follows 2010 ACCF/AHA/AATS/ACR/ASA/SCA/SCAI/SIR/STS/SVM Guidelines for the Diagnosis and Management of Patients with Thoracic Aortic Disease. Circulation. 2010; [id number]. Aortic aneurysm NOS (ICD10-I71.9). 2. Status post coronary bypass graft. 3. Stable cardiomegaly. Aortic Atherosclerosis (ICD10-I70.0) and Emphysema (ICD10-J43.9). Electronically Signed   By: Marijo Conception M.D.   On: 11/15/2019 12:12     I have independently reviewed the above radiology studies  and reviewed the findings with the patient.   Recent Lab Findings: Lab Results  Component Value Date   WBC 9.0 10/31/2019   HGB 13.8 10/31/2019   HCT 43.1 10/31/2019   PLT 216 10/31/2019   GLUCOSE 99 10/31/2019   CHOL 111 01/05/2017   TRIG 102 01/05/2017   HDL 38 (L) 01/05/2017   LDLCALC 53 01/05/2017   ALT 12 (L) 01/05/2017   AST 20 01/05/2017   NA 137 10/31/2019   K 4.4 10/31/2019   CL  100 10/31/2019   CREATININE 1.31 (H) 10/31/2019   BUN 19 10/31/2019   CO2 23 10/31/2019   TSH 3.274 01/05/2017   INR 1.41 08/11/2016   HGBA1C 6.0 (H) 08/10/2016   Chronic Kidney Disease   Stage I     GFR >90  Stage II    GFR 60-89  Stage IIIA GFR 45-59  Stage IIIB GFR 30-44  Stage IV   GFR 15-29  Stage V    GFR  <15  Lab Results  Component Value Date   CREATININE 1.31 (H) 10/31/2019   Estimated Creatinine Clearance: 42 mL/min (A) (by C-G formula based on SCr of 1.31 mg/dL (H)).    Assessment / Plan:   1/Mild dilatation of ascending aorta with trileaflet aortic valve- stable 4.3 cm ascending thoracic aortic aneurysm-recommend follow-up CT of the chest 1 year 2/status  post coronary artery bypass grafting 2017 with recent low risk Myoview study 3/Stage III chronic kidney disease  I have reviewed with the patient signs and symptoms of aortic dissection, have discussed with importance of good blood pressure control, avoiding strenuous lifting, avoid quinolones.   Plan to see him back in 1 year with noncontrasted CT to evaluate the size of his aorta.  I  spent 25 minutes with  the patient face to face and reviewing his studies and previous notes.  [name] MD      [address] [place] [address] Office [phone]     11/15/2019 1:02 PM

## 2019-11-15 NOTE — Patient Instructions (Signed)

## 2019-11-22 DIAGNOSIS — T1501XA Foreign body in cornea, right eye, initial encounter: Secondary | ICD-10-CM | POA: Diagnosis not present

## 2019-12-10 DIAGNOSIS — H353212 Exudative age-related macular degeneration, right eye, with inactive choroidal neovascularization: Secondary | ICD-10-CM | POA: Diagnosis not present

## 2020-03-03 ENCOUNTER — Other Ambulatory Visit: Payer: Self-pay | Admitting: Cardiovascular Disease

## 2020-03-03 DIAGNOSIS — E785 Hyperlipidemia, unspecified: Secondary | ICD-10-CM

## 2020-03-24 DIAGNOSIS — E038 Other specified hypothyroidism: Secondary | ICD-10-CM | POA: Diagnosis not present

## 2020-03-24 DIAGNOSIS — I10 Essential (primary) hypertension: Secondary | ICD-10-CM | POA: Diagnosis not present

## 2020-03-24 DIAGNOSIS — E7849 Other hyperlipidemia: Secondary | ICD-10-CM | POA: Diagnosis not present

## 2020-04-03 ENCOUNTER — Other Ambulatory Visit: Payer: Self-pay | Admitting: Cardiovascular Disease

## 2020-04-14 DIAGNOSIS — M79671 Pain in right foot: Secondary | ICD-10-CM | POA: Diagnosis not present

## 2020-04-14 DIAGNOSIS — L570 Actinic keratosis: Secondary | ICD-10-CM | POA: Diagnosis not present

## 2020-04-14 DIAGNOSIS — L814 Other melanin hyperpigmentation: Secondary | ICD-10-CM | POA: Diagnosis not present

## 2020-04-14 DIAGNOSIS — L578 Other skin changes due to chronic exposure to nonionizing radiation: Secondary | ICD-10-CM | POA: Diagnosis not present

## 2020-04-14 DIAGNOSIS — Z85828 Personal history of other malignant neoplasm of skin: Secondary | ICD-10-CM | POA: Diagnosis not present

## 2020-04-14 DIAGNOSIS — D485 Neoplasm of uncertain behavior of skin: Secondary | ICD-10-CM | POA: Diagnosis not present

## 2020-04-14 DIAGNOSIS — C4442 Squamous cell carcinoma of skin of scalp and neck: Secondary | ICD-10-CM | POA: Diagnosis not present

## 2020-04-14 DIAGNOSIS — L821 Other seborrheic keratosis: Secondary | ICD-10-CM | POA: Diagnosis not present

## 2020-04-14 DIAGNOSIS — L84 Corns and callosities: Secondary | ICD-10-CM | POA: Diagnosis not present

## 2020-04-14 DIAGNOSIS — D225 Melanocytic nevi of trunk: Secondary | ICD-10-CM | POA: Diagnosis not present

## 2020-04-30 DIAGNOSIS — L905 Scar conditions and fibrosis of skin: Secondary | ICD-10-CM | POA: Diagnosis not present

## 2020-04-30 DIAGNOSIS — C4442 Squamous cell carcinoma of skin of scalp and neck: Secondary | ICD-10-CM | POA: Diagnosis not present

## 2020-05-15 DIAGNOSIS — H353212 Exudative age-related macular degeneration, right eye, with inactive choroidal neovascularization: Secondary | ICD-10-CM | POA: Diagnosis not present

## 2020-06-14 DIAGNOSIS — Z23 Encounter for immunization: Secondary | ICD-10-CM | POA: Diagnosis not present

## 2020-06-30 DIAGNOSIS — Z23 Encounter for immunization: Secondary | ICD-10-CM | POA: Diagnosis not present

## 2020-08-21 DIAGNOSIS — H353212 Exudative age-related macular degeneration, right eye, with inactive choroidal neovascularization: Secondary | ICD-10-CM | POA: Diagnosis not present

## 2020-08-21 DIAGNOSIS — H2511 Age-related nuclear cataract, right eye: Secondary | ICD-10-CM | POA: Diagnosis not present

## 2020-08-25 ENCOUNTER — Other Ambulatory Visit: Payer: Self-pay | Admitting: Cardiovascular Disease

## 2020-08-25 DIAGNOSIS — E785 Hyperlipidemia, unspecified: Secondary | ICD-10-CM

## 2020-08-25 NOTE — Telephone Encounter (Signed)
Rx request sent to pharmacy.  

## 2020-08-27 ENCOUNTER — Other Ambulatory Visit: Payer: Self-pay | Admitting: Cardiovascular Disease

## 2020-08-27 DIAGNOSIS — E785 Hyperlipidemia, unspecified: Secondary | ICD-10-CM

## 2020-09-22 ENCOUNTER — Other Ambulatory Visit: Payer: Self-pay | Admitting: Cardiovascular Disease

## 2020-10-02 ENCOUNTER — Other Ambulatory Visit: Payer: Self-pay | Admitting: *Deleted

## 2020-10-02 DIAGNOSIS — I712 Thoracic aortic aneurysm, without rupture, unspecified: Secondary | ICD-10-CM

## 2020-10-29 DIAGNOSIS — R7301 Impaired fasting glucose: Secondary | ICD-10-CM | POA: Diagnosis not present

## 2020-10-29 DIAGNOSIS — Z125 Encounter for screening for malignant neoplasm of prostate: Secondary | ICD-10-CM | POA: Diagnosis not present

## 2020-10-29 DIAGNOSIS — E785 Hyperlipidemia, unspecified: Secondary | ICD-10-CM | POA: Diagnosis not present

## 2020-11-05 DIAGNOSIS — Z1331 Encounter for screening for depression: Secondary | ICD-10-CM | POA: Diagnosis not present

## 2020-11-05 DIAGNOSIS — E785 Hyperlipidemia, unspecified: Secondary | ICD-10-CM | POA: Diagnosis not present

## 2020-11-05 DIAGNOSIS — J439 Emphysema, unspecified: Secondary | ICD-10-CM | POA: Diagnosis not present

## 2020-11-05 DIAGNOSIS — N1831 Chronic kidney disease, stage 3a: Secondary | ICD-10-CM | POA: Diagnosis not present

## 2020-11-05 DIAGNOSIS — R82998 Other abnormal findings in urine: Secondary | ICD-10-CM | POA: Diagnosis not present

## 2020-11-05 DIAGNOSIS — I700 Atherosclerosis of aorta: Secondary | ICD-10-CM | POA: Diagnosis not present

## 2020-11-05 DIAGNOSIS — I480 Paroxysmal atrial fibrillation: Secondary | ICD-10-CM | POA: Diagnosis not present

## 2020-11-05 DIAGNOSIS — R7301 Impaired fasting glucose: Secondary | ICD-10-CM | POA: Diagnosis not present

## 2020-11-05 DIAGNOSIS — I714 Abdominal aortic aneurysm, without rupture: Secondary | ICD-10-CM | POA: Diagnosis not present

## 2020-11-05 DIAGNOSIS — Z0000 Encounter for general adult medical examination without abnormal findings: Secondary | ICD-10-CM | POA: Diagnosis not present

## 2020-11-05 DIAGNOSIS — I129 Hypertensive chronic kidney disease with stage 1 through stage 4 chronic kidney disease, or unspecified chronic kidney disease: Secondary | ICD-10-CM | POA: Diagnosis not present

## 2020-11-05 DIAGNOSIS — I712 Thoracic aortic aneurysm, without rupture: Secondary | ICD-10-CM | POA: Diagnosis not present

## 2020-11-05 DIAGNOSIS — Z1339 Encounter for screening examination for other mental health and behavioral disorders: Secondary | ICD-10-CM | POA: Diagnosis not present

## 2020-11-05 DIAGNOSIS — I739 Peripheral vascular disease, unspecified: Secondary | ICD-10-CM | POA: Diagnosis not present

## 2020-11-05 DIAGNOSIS — I25810 Atherosclerosis of coronary artery bypass graft(s) without angina pectoris: Secondary | ICD-10-CM | POA: Diagnosis not present

## 2020-11-20 ENCOUNTER — Other Ambulatory Visit: Payer: Self-pay

## 2020-11-20 ENCOUNTER — Ambulatory Visit (INDEPENDENT_AMBULATORY_CARE_PROVIDER_SITE_OTHER): Payer: Medicare Other | Admitting: Cardiothoracic Surgery

## 2020-11-20 ENCOUNTER — Encounter: Payer: Self-pay | Admitting: Cardiothoracic Surgery

## 2020-11-20 ENCOUNTER — Ambulatory Visit
Admission: RE | Admit: 2020-11-20 | Discharge: 2020-11-20 | Disposition: A | Discharge status: Home / Self Care | Payer: Medicare Other | Source: Ambulatory Visit | Attending: Cardiothoracic Surgery | Admitting: Cardiothoracic Surgery

## 2020-11-20 ENCOUNTER — Ambulatory Visit: Payer: Medicare Other | Admitting: Cardiothoracic Surgery

## 2020-11-20 VITALS — BP 105/73 | HR 78 | Temp 97.9°F | Resp 20 | Wt 154.0 lb

## 2020-11-20 DIAGNOSIS — J40 Bronchitis, not specified as acute or chronic: Secondary | ICD-10-CM | POA: Diagnosis not present

## 2020-11-20 DIAGNOSIS — J8489 Other specified interstitial pulmonary diseases: Secondary | ICD-10-CM | POA: Diagnosis not present

## 2020-11-20 DIAGNOSIS — I712 Thoracic aortic aneurysm, without rupture, unspecified: Secondary | ICD-10-CM

## 2020-11-20 DIAGNOSIS — J439 Emphysema, unspecified: Secondary | ICD-10-CM | POA: Diagnosis not present

## 2020-11-20 NOTE — Progress Notes (Signed)
[address]       [place] [address]             [phone]                    [facility] Medical Record #[id number] Date of Birth: [birth year]  Referring: Ginger Organ., MD Primary Care: Ginger Organ., MD Primary Cardiologist: Kathlyn Sacramento, MD  Chief Complaint:    Chief Complaint  Patient presents with  . Thoracic Aortic Aneurysm    1 year f/u with Chest CT   DATE OF PROCEDURE:  08/11/2016   OPERATIVE REPORT PREOPERATIVE DIAGNOSES:  Coronary occlusive disease with unstable angina. POSTOPERATIVE DIAGNOSIS:  Coronary occlusive disease with unstable angina. PROCEDURE PERFORMED:  Coronary artery bypass grafting x4 with left internal mammary to the left anterior descending coronary artery, sequential reverse saphenous vein graft to the first obtuse marginal and distal circumflex, reverse saphenous vein graft to the posterior descending coronary artery with endoscopic right greater saphenous vein harvesting, thigh and calf.  History of Present Illness:     Thomas Fuentes 80 y.o. male is seen in the office  today for a follow-up CT of the chest for evaluation of his ascending aorta.     Prior to surgery done in 2017  A CT scan was done on the chest, which demonstrated mild enlargement of his ascending aorta with maximum dimension of approximately 4.2 cm with evidence of a trileaflet aortic valve.   Patient had a follow-up CT scan in February 2020 that showed slight change to 4.4 cm compared to 2019 and 2017 .  March 3 the patient had an episode of epigastric pain which he thought was similar to his anginal equivalent previously he was seen in the emergency room, troponins and EKG were negative, no consideration for the size of his aorta has been an issue was entertained and he was discharged home follow-up Myoview in the cardiology office was low risk.  He has had no recurrent symptoms of epigastric pain.    Current  Activity/ Functional Status:  Patient is independent with mobility/ambulation, transfers, ADL's, IADL's.   Zubrod Score: At the time of surgery this patient's most appropriate activity status/level should be described as: [x]     0    Normal activity, no symptoms []     1    Restricted in physical strenuous activity but ambulatory, able to do out light work []     2    Ambulatory and capable of self care, unable to do work activities, up and about               >50 % of waking hours                              []     3    Only limited self care, in bed greater than 50% of waking hours []     4    Completely disabled, no self care, confined to bed or chair []     5    Moribund   Past Medical History:  Diagnosis Date  . AAA (abdominal aortic aneurysm) ([place])   . Chronic renal disease, stage III ([place])   . Coronary artery disease    a. MI 2000 s/p PCI and 2 stent placement to unknown arteries;  b. LHC 08/09/2016 o-pLAD 30%, mLAD 99%, OM2 50%, OM3  85%, pRCA 99%, mid RCA 60%, RPDA 60%, LVEDP mod elevated. CABG in 07/2016 :  LIMA to LAD, SVG to RCA , SVG to LCX; c. 10/2019 MV: No ischemia or scar. Low risk.  . Diabetes mellitus without complication ([place])   . Dyslipidemia   . Hyperlipidemia   . Hypertension   . Hypothyroidism   . Macular degeneration    bilateral  . Metabolic syndrome   . MI (myocardial infarction) ([place])   . Peripheral neuropathy   . Peripheral vascular disease [facility])     Past Surgical History:  Procedure Laterality Date  . CARDIAC CATHETERIZATION  2000   [place] x2 stents  . CARDIAC CATHETERIZATION N/A 08/09/2016   Procedure: Left Heart Cath and Coronary Angiography;  Surgeon: Wellington Hampshire, MD;  Location: [place] [facility];  Service: Cardiovascular;  Laterality: N/A;  . CORONARY ARTERY BYPASS GRAFT N/A 08/11/2016   Procedure: CORONARY ARTERY BYPASS GRAFTING on pump using left internal mammary artery to left anterior descending coronary artery , portion of right greater  saphenous vein to right coronary artery, portion of right greater saphenous vein graft to distal circumflex.;  Surgeon: [name], MD;  Location: [place];  Service: Open Heart Surgery;  Laterality: N/A;  . CORONARY STENT PLACEMENT    . ENDOVEIN HARVEST OF GREATER SAPHENOUS VEIN Right 08/11/2016   Procedure: ENDOVEIN HARVEST OF GREATER SAPHENOUS VEIN;  Surgeon: [name], MD;  Location: [place];  Service: Open Heart Surgery;  Laterality: Right;  . TEE WITHOUT CARDIOVERSION N/A 08/11/2016   Procedure: TRANSESOPHAGEAL ECHOCARDIOGRAM (TEE);  Surgeon: [name], MD;  Location: [place];  Service: Open Heart Surgery;  Laterality: N/A;    Family History  Problem Relation Age of Onset  . Hypertension Mother   . Prostate cancer Father   . Diabetes Sister      Social History   Tobacco Use  Smoking Status Former Smoker  . Years: 40.00  . Types: Cigarettes  . Quit date: 02/06/1999  . Years since quitting: 21.8  Smokeless Tobacco Never Used    Social History   Substance and Sexual Activity  Alcohol Use Yes   Comment: occasional     Allergies  Allergen Reactions  . Lidocaine Anaphylaxis    novacaine and lidocaine  . Quinolones     Patient was warned about not using Cipro and similar antibiotics. Recent studies have raised concern that fluoroquinolone antibiotics could be associated with an increased risk of aortic aneurysm Fluoroquinolones have non-antimicrobial properties that might jeopardise the integrity of the extracellular matrix of the vascular wall In a  propensity score matched cohort study in [place], there was a 66% increased rate of aortic aneurysm or dissection associated with oral fluoroquinolone use, compared wit  . Statins Nausea And Vomiting    GI symptoms on almost all statins    Current Outpatient Medications  Medication Sig Dispense Refill  . aspirin EC 81 MG EC tablet Take 1 tablet (81 mg total) by mouth daily.    . Cholecalciferol (VITAMIN D3  PO) Take 6,000 Units by mouth. Taking 1 capsule daily    . ezetimibe (ZETIA) 10 MG tablet Take 1 tablet (10 mg total) by mouth daily. 30 tablet 1  . fenofibrate 160 MG tablet Take 160 mg by mouth.     . gabapentin (NEURONTIN) 600 MG tablet Take 600 mg by mouth 3 (three) times daily.    Marland Kitchen levothyroxine (SYNTHROID) 50 MCG tablet Take 50 mcg by mouth daily.    Marland Kitchen  lisinopril (ZESTRIL) 5 MG tablet Take 1 tablet by mouth once daily 90 tablet 3  . loratadine-pseudoephedrine (CLARITIN-D 24-HOUR) 10-240 MG 24 hr tablet Take 1 tablet by mouth daily.    . Multiple Vitamins-Minerals (ZINC PO) Take by mouth. Taking 2 capsules daily    . multivitamin-lutein (OCUVITE-LUTEIN) CAPS capsule Take 1 capsule by mouth 2 (two) times daily.    . rosuvastatin (CRESTOR) 5 MG tablet Take 1 tablet by mouth once daily 90 tablet 0  . sildenafil (REVATIO) 20 MG tablet Take 60-80 mg by mouth as directed.     No current facility-administered medications for this visit.    Pertinent items are noted in HPI.     PHYSICAL EXAMINATION: BP 105/73 (BP Location: Left Arm, Patient Position: Sitting, Cuff Size: Normal)   Pulse 78   Temp 97.9 F (36.6 C) (Skin)   Resp 20   Wt 154 lb (69.9 kg)   SpO2 97% Comment: RA  BMI 24.12 kg/m  General appearance: alert, cooperative and no distress Neck: no adenopathy, no carotid bruit, no JVD, supple, symmetrical, trachea midline and thyroid not enlarged, symmetric, no tenderness/mass/nodules Lymph nodes: Cervical, supraclavicular, and axillary nodes normal. Resp: clear to auscultation bilaterally Cardio: regular rate and rhythm, S1, S2 normal, no murmur, click, rub or gallop GI: soft, non-tender; bowel sounds normal; no masses,  no organomegaly Extremities: extremities normal, atraumatic, no cyanosis or edema and Homans sign is negative, no sign of DVT Neurologic: Grossly normal  Diagnostic Studies & Laboratory data:     Recent Radiology Findings: CT CHEST WO CONTRAST  Result  Date: 11/20/2020 CLINICAL DATA:  Follow-up thoracic aortic aneurysm. EXAM: CT CHEST WITHOUT CONTRAST TECHNIQUE: Multidetector CT imaging of the chest was performed following the standard protocol without IV contrast. COMPARISON:  CT scan 11/15/2019 FINDINGS: Cardiovascular: The heart is normal in size. No pericardial effusion. There is tortuosity, ectasia and calcification of the thoracic aorta. Stable 4.2 cm fusiform aneurysmal dilatation of the ascending thoracic aorta. Stable surgical changes from coronary artery bypass surgery. Stable advanced coronary artery calcifications. Mediastinum/Nodes: Small scattered stable mediastinal and hilar lymph nodes. No mass or adenopathy. The esophagus is grossly normal. Lungs/Pleura: Stable bronchitic type interstitial lung changes and emphysematous changes. No worrisome pulmonary lesions or acute overlying pulmonary process. No pleural effusions or pleural nodules. Upper Abdomen: No significant upper abdominal findings. Musculoskeletal: No chest wall mass, supraclavicular or axillary adenopathy. The bony thorax is intact. IMPRESSION: 1. Stable 4.2 cm fusiform aneurysmal dilatation of the ascending thoracic aorta. Recommend annual imaging followup by CTA or MRA. This recommendation follows 2010 ACCF/AHA/AATS/ACR/ASA/SCA/SCAI/SIR/STS/SVM Guidelines for the Diagnosis and Management of Patients with Thoracic Aortic Disease. Circulation. 2010; 121: B341-P379. Aortic aneurysm NOS (ICD10-I71.9) 2. Stable surgical changes from coronary artery bypass surgery. 3. Stable bronchitic type interstitial lung changes and emphysematous changes. No worrisome pulmonary lesions or acute overlying pulmonary process. 4. Emphysema and aortic atherosclerosis. Aortic Atherosclerosis (ICD10-I70.0) and Emphysema (ICD10-J43.9). Electronically Signed   By: Marijo Sanes M.D.   On: 11/20/2020 10:55   I have independently reviewed the above radiology studies  and reviewed the findings with the  patient.    NM Myocar Multi W/Spect W/Wall Motion / EF  Result Date: 11/02/2019  Normal pharmacologic myocardial perfusion stress test without significant ischemia or scar.  The left ventricular ejection fraction is normal by visual estimation and Siemens calculation.  Attenuation correction CT shows post-CABG findings as well as aortic atherosclerosis and coronary artery calcification.  This is a low risk study.  CT ANGIO CHEST AORTA W/CM & OR WO/CM  Result Date: 11/15/2019 CLINICAL DATA:  Thoracic aortic aneurysm. EXAM: CT ANGIOGRAPHY CHEST WITH CONTRAST TECHNIQUE: Multidetector CT imaging of the chest was performed using the standard protocol during bolus administration of intravenous contrast. Multiplanar CT image reconstructions and MIPs were obtained to evaluate the vascular anatomy. CONTRAST:  1mL ISOVUE-370 IOPAMIDOL (ISOVUE-370) INJECTION 76% COMPARISON:  October 23, 2018. FINDINGS: Cardiovascular: Status post coronary bypass graft. Grossly stable 4.3 cm ascending thoracic aortic aneurysm is noted. Transverse aortic arch measures 2.9 cm. Proximal descending thoracic aorta measures 3.0 cm. Atherosclerosis of thoracic aorta is noted without dissection. Great vessels are widely patent. Stable cardiomegaly. No pericardial effusion. Aortic root is mildly dilated at 4.4 cm. Mediastinum/Nodes: No enlarged mediastinal, hilar, or axillary lymph nodes. Thyroid gland, trachea, and esophagus demonstrate no significant findings. Lungs/Pleura: No pneumothorax or pleural effusion is noted. Mild emphysematous disease is noted in the upper lobes. No acute pulmonary abnormality is noted. Upper Abdomen: No acute abnormality. Musculoskeletal: No chest wall abnormality. No acute or significant osseous findings. Review of the MIP images confirms the above findings. IMPRESSION: 1. Grossly stable 4.3 cm ascending thoracic aortic aneurysm. Recommend annual imaging followup by CTA or MRA. This recommendation follows  2010 ACCF/AHA/AATS/ACR/ASA/SCA/SCAI/SIR/STS/SVM Guidelines for the Diagnosis and Management of Patients with Thoracic Aortic Disease. Circulation. 2010; 121: E703-J009. Aortic aneurysm NOS (ICD10-I71.9). 2. Status post coronary bypass graft. 3. Stable cardiomegaly. Aortic Atherosclerosis (ICD10-I70.0) and Emphysema (ICD10-J43.9). Electronically Signed   By: Marijo Conception M.D.   On: 11/15/2019 12:12     I have independently reviewed the above radiology studies  and reviewed the findings with the patient.   Recent Lab Findings: Lab Results  Component Value Date   WBC 9.0 10/31/2019   HGB 13.8 10/31/2019   HCT 43.1 10/31/2019   PLT 216 10/31/2019   GLUCOSE 99 10/31/2019   CHOL 111 01/05/2017   TRIG 102 01/05/2017   HDL 38 (L) 01/05/2017   LDLCALC 53 01/05/2017   ALT 12 (L) 01/05/2017   AST 20 01/05/2017   NA 137 10/31/2019   K 4.4 10/31/2019   CL 100 10/31/2019   CREATININE 1.31 (H) 10/31/2019   BUN 19 10/31/2019   CO2 23 10/31/2019   TSH 3.274 01/05/2017   INR 1.41 08/11/2016   HGBA1C 6.0 (H) 08/10/2016   Chronic Kidney Disease   Stage I     GFR >90  Stage II    GFR 60-89  Stage IIIA GFR 45-59  Stage IIIB GFR 30-44  Stage IV   GFR 15-29  Stage V    GFR  <15  Lab Results  Component Value Date   CREATININE 1.31 (H) 10/31/2019   CrCl cannot be calculated (Patient's most recent lab result is older than the maximum 21 days allowed.).    Assessment / Plan:   1/Mild dilatation of ascending aorta with trileaflet aortic valve- stable 4.2 cm   ascending thoracic aortic aneurysm-recommend follow-up CT of the chest 1 year 2/status  post coronary artery bypass grafting 2017 with recent low risk Myoview study 3/Stage III chronic kidney disease  I have reviewed with the patient signs and symptoms of aortic dissection, have discussed with importance of good blood pressure control, avoiding strenuous lifting, avoid quinolones.   Plan to see him back in 1 year with noncontrasted CT  to evaluate the size of his aorta.  I  [name] MD      [address] [place] [address]  Office [phone]     11/20/2020 10:59 AM

## 2020-11-23 ENCOUNTER — Other Ambulatory Visit: Payer: Self-pay | Admitting: Cardiovascular Disease

## 2020-11-23 DIAGNOSIS — E785 Hyperlipidemia, unspecified: Secondary | ICD-10-CM

## 2020-11-24 NOTE — Telephone Encounter (Signed)
Please call pt to schedule a follow-up appointment for refills. Thanks!

## 2020-11-24 NOTE — Telephone Encounter (Signed)
Scheduled 4/14 Thomas Fuentes

## 2020-11-24 NOTE — Telephone Encounter (Signed)
Rx request sent to pharmacy.  

## 2020-11-27 DIAGNOSIS — H353212 Exudative age-related macular degeneration, right eye, with inactive choroidal neovascularization: Secondary | ICD-10-CM | POA: Diagnosis not present

## 2020-12-11 ENCOUNTER — Encounter: Payer: Self-pay | Admitting: Nurse Practitioner

## 2020-12-11 ENCOUNTER — Ambulatory Visit (INDEPENDENT_AMBULATORY_CARE_PROVIDER_SITE_OTHER): Payer: Medicare Other | Admitting: Nurse Practitioner

## 2020-12-11 ENCOUNTER — Other Ambulatory Visit: Payer: Self-pay

## 2020-12-11 VITALS — BP 118/68 | HR 68 | Ht 67.5 in | Wt 156.5 lb

## 2020-12-11 DIAGNOSIS — I714 Abdominal aortic aneurysm, without rupture, unspecified: Secondary | ICD-10-CM

## 2020-12-11 DIAGNOSIS — I712 Thoracic aortic aneurysm, without rupture: Secondary | ICD-10-CM | POA: Diagnosis not present

## 2020-12-11 DIAGNOSIS — I2510 Atherosclerotic heart disease of native coronary artery without angina pectoris: Secondary | ICD-10-CM | POA: Diagnosis not present

## 2020-12-11 DIAGNOSIS — I7121 Aneurysm of the ascending aorta, without rupture: Secondary | ICD-10-CM

## 2020-12-11 DIAGNOSIS — I10 Essential (primary) hypertension: Secondary | ICD-10-CM | POA: Diagnosis not present

## 2020-12-11 DIAGNOSIS — E785 Hyperlipidemia, unspecified: Secondary | ICD-10-CM | POA: Diagnosis not present

## 2020-12-11 NOTE — Patient Instructions (Signed)
Medication Instructions:  No changes  *If you need a refill on your cardiac medications before your next appointment, please call your pharmacy*   Lab Work: None  If you have labs (blood work) drawn today and your tests are completely normal, you will receive your results only by: Marland Kitchen MyChart Message (if you have MyChart) OR . A paper copy in the mail If you have any lab test that is abnormal or we need to change your treatment, we will call you to review the results.   Testing/Procedures: Your physician has requested that you have an abdominal aorta duplex. During this test, an ultrasound is used to evaluate the aorta. Allow 30 minutes for this exam. Do not eat after midnight the day before and avoid carbonated beverages    Follow-Up: At [facility], you and your health needs are our priority.  As part of our continuing mission to provide you with exceptional heart care, we have created designated Provider Care Teams.  These Care Teams include your primary Cardiologist (physician) and Advanced Practice Providers (APPs -  Physician Assistants and Nurse Practitioners) who all work together to provide you with the care you need, when you need it.  We recommend signing up for the patient portal called "MyChart".  Sign up information is provided on this After Visit Summary.  MyChart is used to connect with patients for Virtual Visits (Telemedicine).  Patients are able to view lab/test results, encounter notes, upcoming appointments, etc.  Non-urgent messages can be sent to your provider as well.   To learn more about what you can do with MyChart, go to [email or web address].    Your next appointment:   1 year(s)  The format for your next appointment:   In Person  Provider:   Kathlyn Sacramento, MD

## 2020-12-11 NOTE — Progress Notes (Signed)
Office Visit    Patient Name: Thomas Fuentes Date of Encounter: 12/11/2020  Primary Care Provider:  Ginger Organ., MD Primary Cardiologist:  Kathlyn Sacramento, MD  Chief Complaint    80 year old male with a history of CAD status post non-STEMI and CABG in December 2017, hypertension, hyperlipidemia, type 2 diabetes mellitus, ascending aortic and abdominal aneurysms, and stage III chronic kidney disease, who presents for follow-up of CAD.  Past Medical History    Past Medical History:  Diagnosis Date  . AAA (abdominal aortic aneurysm) ([place])    a. 05/2019 U/S: 3.1cm AAA  . Ascending aortic aneurysm ([place])    a. 10/2019 CTA Chest: 4.3cm asc TAA; b. 10/2020 CTA Chest: 4.2cm asc TAA.  Marland Kitchen Chronic renal disease, stage III ([place])   . Coronary artery disease    a. MI 2000 s/p PCI and 2 stent placement to unknown arteries;  b. LHC 08/09/2016 o-pLAD 30%, mLAD 99%, OM2 50%, OM3 85%, pRCA 99%, mid RCA 60%, RPDA 60%, LVEDP mod elevated. CABG in 07/2016 :  LIMA to LAD, SVG to RCA , SVG to LCX; c. 10/2019 MV: No ischemia or scar. Low risk.  . Diabetes mellitus without complication ([place])   . Hyperlipidemia   . Hypertension   . Hypothyroidism   . Macular degeneration    bilateral  . Metabolic syndrome   . MI (myocardial infarction) ([place])   . Peripheral neuropathy   . Peripheral vascular disease [facility])    Past Surgical History:  Procedure Laterality Date  . CARDIAC CATHETERIZATION  2000   [place] x2 stents  . CARDIAC CATHETERIZATION N/A 08/09/2016   Procedure: Left Heart Cath and Coronary Angiography;  Surgeon: Wellington Hampshire, MD;  Location: [place] [facility];  Service: Cardiovascular;  Laterality: N/A;  . CORONARY ARTERY BYPASS GRAFT N/A 08/11/2016   Procedure: CORONARY ARTERY BYPASS GRAFTING on pump using left internal mammary artery to left anterior descending coronary artery , portion of right greater saphenous vein to right coronary artery, portion of right greater saphenous vein graft  to distal circumflex.;  Surgeon: [name], MD;  Location: [place];  Service: Open Heart Surgery;  Laterality: N/A;  . CORONARY STENT PLACEMENT    . ENDOVEIN HARVEST OF GREATER SAPHENOUS VEIN Right 08/11/2016   Procedure: ENDOVEIN HARVEST OF GREATER SAPHENOUS VEIN;  Surgeon: [name], MD;  Location: [place];  Service: Open Heart Surgery;  Laterality: Right;  . TEE WITHOUT CARDIOVERSION N/A 08/11/2016   Procedure: TRANSESOPHAGEAL ECHOCARDIOGRAM (TEE);  Surgeon: [name], MD;  Location: [place];  Service: Open Heart Surgery;  Laterality: N/A;    Allergies  Allergies  Allergen Reactions  . Lidocaine Anaphylaxis    novacaine and lidocaine  . Quinolones     Patient was warned about not using Cipro and similar antibiotics. Recent studies have raised concern that fluoroquinolone antibiotics could be associated with an increased risk of aortic aneurysm Fluoroquinolones have non-antimicrobial properties that might jeopardise the integrity of the extracellular matrix of the vascular wall In a  propensity score matched cohort study in [place], there was a 66% increased rate of aortic aneurysm or dissection associated with oral fluoroquinolone use, compared wit  . Statins Nausea And Vomiting    GI symptoms on almost all statins    History of Present Illness    80 year old male with above past medical history including coronary artery disease, hypertension, hyperlipidemia, type 2 diabetes mellitus, ascending aortic aneurysm, abdominal aortic aneurysm, and stage III  chronic kidney disease.  Cardiac history dates back to 2000, when he suffered an MI and underwent PCI and stent placement.  Unfortunately, he suffered a non-STEMI in December 2017 and underwent diagnostic catheterization revealing severe mid LAD, OM 3, and RCA disease.  He subsequently underwent CABG x3.  Most recent stress test in March 2021 in the setting of chest pain showed no ischemia or scar and was deemed low risk.   He has been followed closely by CT surgery in the setting of ascending aortic aneurysm with most recent CTA performed in March of this year showing stable aneurysm at 4.2 cm.  Last abdominal ultrasound was performed in September 2020, at which time his AAA was measured at 3.1 cm.  He was last seen in cardiology clinic in March 2021 and has since done well.  He remains very active, walking regularly without symptoms or limitations.  He denies chest pain, palpitations, dyspnea, pnd, orthopnea, n, v, dizziness, syncope, edema, weight gain, or early satiety.   Home Medications    Prior to Admission medications   Medication Sig Start Date End Date Taking? Authorizing Provider  aspirin EC 81 MG EC tablet Take 1 tablet (81 mg total) by mouth daily. 08/16/16  Yes Gold, Wilder Glade, PA-C  Cholecalciferol (VITAMIN D3 PO) Take 6,000 Units by mouth. Taking 1 capsule daily   Yes [provider]  ezetimibe (ZETIA) 10 MG tablet Take 1 tablet (10 mg total) by mouth daily. 08/16/16  Yes Gold, Wayne E, PA-C  fenofibrate 160 MG tablet Take 160 mg by mouth.  02/02/17  Yes [provider]  gabapentin (NEURONTIN) 600 MG tablet Take 600 mg by mouth 3 (three) times daily.   Yes [provider]  levothyroxine (SYNTHROID) 50 MCG tablet Take 50 mcg by mouth daily. 10/24/19  Yes [provider]  lisinopril (ZESTRIL) 5 MG tablet Take 1 tablet by mouth once daily 09/22/20  Yes Arida, Mertie Clause, MD  loratadine-pseudoephedrine (CLARITIN-D 24-HOUR) 10-240 MG 24 hr tablet Take 1 tablet by mouth daily.   Yes [provider]  Multiple Vitamins-Minerals (ZINC PO) Take by mouth. Taking 2 capsules daily   Yes [provider]  multivitamin-lutein (OCUVITE-LUTEIN) CAPS capsule Take 1 capsule by mouth 2 (two) times daily.   Yes [provider]  rosuvastatin (CRESTOR) 5 MG tablet Take 1 tablet (5 mg total) by mouth daily. Please keep your upcoming appointment for refills. 11/24/20  Yes  Wellington Hampshire, MD  sildenafil (REVATIO) 20 MG tablet Take 60-80 mg by mouth as directed. 08/10/19  Yes [provider]    Review of Systems    He denies chest pain, palpitations, dyspnea, pnd, orthopnea, n, v, dizziness, syncope, edema, weight gain, or early satiety.  All other systems reviewed and are otherwise negative except as noted above.  Physical Exam    VS:  BP 118/68 (BP Location: Left Arm, Patient Position: Sitting, Cuff Size: Normal)   Pulse 68   Ht 5' 7.5" (1.715 m)   Wt 156 lb 8 oz (71 kg)   SpO2 96%   BMI 24.15 kg/m  , BMI Body mass index is 24.15 kg/m. GEN: Well nourished, well developed, in no acute distress. HEENT: normal. Neck: Supple, no JVD, carotid bruits, or masses. Cardiac: RRR, no murmurs, rubs, or gallops. No clubbing, cyanosis, edema.  Radials 2+/PT 1+ and equal bilaterally.  Respiratory:  Respirations regular and unlabored, clear to auscultation bilaterally. GI: Soft, nontender, nondistended, BS + x 4. MS: no deformity or  atrophy. Skin: warm and dry, no rash. Neuro:  Strength and sensation are intact. Psych: Normal affect.  Accessory Clinical Findings    ECG personally reviewed by me today - regular sinus rhythm, 68, first-degree AV block - no acute changes.  Patient reports normal lab work last month with his primary care provider.  Results not available for review.  Assessment & Plan    1.  Coronary artery disease: Status post non-STEMI in 2017 CABG x3 at that time.  Low risk Myoview in March 2021 in the setting of chest pain.  He has done well over the past year and remains very active outside in his yard.  He denies chest pain or dyspnea.  He remains on aspirin, Zetia, statin, fibrate, and ACE inhibitor therapy.  2.  Essential hypertension: Stable on lisinopril therapy.  3.  Hyperlipidemia: Remains on fenofibrate, statin, and Zetia therapy.  He has not been having any myalgias.  He says he recently had lipids drawn by primary care  and numbers "looked good."  Labs not available for review today.  4.  Ascending aortic aneurysm: Stable at 4.2 cm by recent CTA.  This is being followed by cardiothoracic surgery.  5.  Abdominal aortic aneurysm: He is overdue for abdominal ultrasound and I will arrange.  At last evaluation, he measured 3.1 cm in September 2020.  6.  Disposition: Follow-up abdominal aortic ultrasound.  Follow-up in clinic in 1 year or sooner if necessary.  Murray Hodgkins, NP 12/11/2020, 4:17 PM

## 2020-12-24 ENCOUNTER — Other Ambulatory Visit: Payer: Self-pay | Admitting: Cardiovascular Disease

## 2020-12-24 DIAGNOSIS — E785 Hyperlipidemia, unspecified: Secondary | ICD-10-CM

## 2020-12-27 DIAGNOSIS — I129 Hypertensive chronic kidney disease with stage 1 through stage 4 chronic kidney disease, or unspecified chronic kidney disease: Secondary | ICD-10-CM | POA: Diagnosis not present

## 2020-12-27 DIAGNOSIS — E785 Hyperlipidemia, unspecified: Secondary | ICD-10-CM | POA: Diagnosis not present

## 2020-12-27 DIAGNOSIS — I480 Paroxysmal atrial fibrillation: Secondary | ICD-10-CM | POA: Diagnosis not present

## 2020-12-27 DIAGNOSIS — N1831 Chronic kidney disease, stage 3a: Secondary | ICD-10-CM | POA: Diagnosis not present

## 2021-01-19 ENCOUNTER — Other Ambulatory Visit: Payer: Self-pay

## 2021-01-19 ENCOUNTER — Ambulatory Visit (INDEPENDENT_AMBULATORY_CARE_PROVIDER_SITE_OTHER): Payer: Medicare Other

## 2021-01-19 DIAGNOSIS — I714 Abdominal aortic aneurysm, without rupture, unspecified: Secondary | ICD-10-CM

## 2021-01-20 ENCOUNTER — Other Ambulatory Visit: Payer: Self-pay | Admitting: *Deleted

## 2021-01-20 DIAGNOSIS — I714 Abdominal aortic aneurysm, without rupture, unspecified: Secondary | ICD-10-CM

## 2021-02-19 DIAGNOSIS — H353212 Exudative age-related macular degeneration, right eye, with inactive choroidal neovascularization: Secondary | ICD-10-CM | POA: Diagnosis not present

## 2021-03-29 DIAGNOSIS — I129 Hypertensive chronic kidney disease with stage 1 through stage 4 chronic kidney disease, or unspecified chronic kidney disease: Secondary | ICD-10-CM | POA: Diagnosis not present

## 2021-03-29 DIAGNOSIS — E785 Hyperlipidemia, unspecified: Secondary | ICD-10-CM | POA: Diagnosis not present

## 2021-03-29 DIAGNOSIS — N1831 Chronic kidney disease, stage 3a: Secondary | ICD-10-CM | POA: Diagnosis not present

## 2021-03-29 DIAGNOSIS — I480 Paroxysmal atrial fibrillation: Secondary | ICD-10-CM | POA: Diagnosis not present

## 2021-04-15 DIAGNOSIS — L814 Other melanin hyperpigmentation: Secondary | ICD-10-CM | POA: Diagnosis not present

## 2021-04-15 DIAGNOSIS — L578 Other skin changes due to chronic exposure to nonionizing radiation: Secondary | ICD-10-CM | POA: Diagnosis not present

## 2021-04-15 DIAGNOSIS — Z85828 Personal history of other malignant neoplasm of skin: Secondary | ICD-10-CM | POA: Diagnosis not present

## 2021-04-15 DIAGNOSIS — D485 Neoplasm of uncertain behavior of skin: Secondary | ICD-10-CM | POA: Diagnosis not present

## 2021-04-15 DIAGNOSIS — C44319 Basal cell carcinoma of skin of other parts of face: Secondary | ICD-10-CM | POA: Diagnosis not present

## 2021-04-15 DIAGNOSIS — L821 Other seborrheic keratosis: Secondary | ICD-10-CM | POA: Diagnosis not present

## 2021-04-15 DIAGNOSIS — L570 Actinic keratosis: Secondary | ICD-10-CM | POA: Diagnosis not present

## 2021-04-15 DIAGNOSIS — D225 Melanocytic nevi of trunk: Secondary | ICD-10-CM | POA: Diagnosis not present

## 2021-06-01 DIAGNOSIS — H353212 Exudative age-related macular degeneration, right eye, with inactive choroidal neovascularization: Secondary | ICD-10-CM | POA: Diagnosis not present

## 2021-06-18 DIAGNOSIS — C44319 Basal cell carcinoma of skin of other parts of face: Secondary | ICD-10-CM | POA: Diagnosis not present

## 2021-07-16 DIAGNOSIS — Z23 Encounter for immunization: Secondary | ICD-10-CM | POA: Diagnosis not present

## 2021-07-22 ENCOUNTER — Other Ambulatory Visit: Payer: Self-pay | Admitting: Cardiovascular Disease

## 2021-09-14 DIAGNOSIS — H353212 Exudative age-related macular degeneration, right eye, with inactive choroidal neovascularization: Secondary | ICD-10-CM | POA: Diagnosis not present

## 2021-10-08 ENCOUNTER — Other Ambulatory Visit: Payer: Self-pay | Admitting: *Deleted

## 2021-10-08 DIAGNOSIS — I7121 Aneurysm of the ascending aorta, without rupture: Secondary | ICD-10-CM

## 2021-10-28 DIAGNOSIS — C679 Malignant neoplasm of bladder, unspecified: Secondary | ICD-10-CM

## 2021-10-28 HISTORY — DX: Malignant neoplasm of bladder, unspecified: C67.9

## 2021-11-18 DIAGNOSIS — M859 Disorder of bone density and structure, unspecified: Secondary | ICD-10-CM | POA: Diagnosis not present

## 2021-11-18 DIAGNOSIS — I10 Essential (primary) hypertension: Secondary | ICD-10-CM | POA: Diagnosis not present

## 2021-11-18 DIAGNOSIS — Z125 Encounter for screening for malignant neoplasm of prostate: Secondary | ICD-10-CM | POA: Diagnosis not present

## 2021-11-18 DIAGNOSIS — R7301 Impaired fasting glucose: Secondary | ICD-10-CM | POA: Diagnosis not present

## 2021-11-18 DIAGNOSIS — E039 Hypothyroidism, unspecified: Secondary | ICD-10-CM | POA: Diagnosis not present

## 2021-11-18 DIAGNOSIS — E785 Hyperlipidemia, unspecified: Secondary | ICD-10-CM | POA: Diagnosis not present

## 2021-11-23 ENCOUNTER — Ambulatory Visit
Admission: RE | Admit: 2021-11-23 | Discharge: 2021-11-23 | Disposition: A | Discharge status: Home / Self Care | Payer: Medicare Other | Source: Ambulatory Visit | Attending: Cardiothoracic Surgery | Admitting: Cardiothoracic Surgery

## 2021-11-23 ENCOUNTER — Encounter: Payer: Medicare Other | Admitting: Cardiothoracic Surgery

## 2021-11-23 DIAGNOSIS — I7121 Aneurysm of the ascending aorta, without rupture: Secondary | ICD-10-CM

## 2021-11-25 DIAGNOSIS — Z23 Encounter for immunization: Secondary | ICD-10-CM | POA: Diagnosis not present

## 2021-11-25 DIAGNOSIS — N1831 Chronic kidney disease, stage 3a: Secondary | ICD-10-CM | POA: Diagnosis not present

## 2021-11-25 DIAGNOSIS — I700 Atherosclerosis of aorta: Secondary | ICD-10-CM | POA: Diagnosis not present

## 2021-11-25 DIAGNOSIS — Z0000 Encounter for general adult medical examination without abnormal findings: Secondary | ICD-10-CM | POA: Diagnosis not present

## 2021-11-25 DIAGNOSIS — Z1339 Encounter for screening examination for other mental health and behavioral disorders: Secondary | ICD-10-CM | POA: Diagnosis not present

## 2021-11-25 DIAGNOSIS — I25810 Atherosclerosis of coronary artery bypass graft(s) without angina pectoris: Secondary | ICD-10-CM | POA: Diagnosis not present

## 2021-11-25 DIAGNOSIS — I712 Thoracic aortic aneurysm, without rupture, unspecified: Secondary | ICD-10-CM | POA: Diagnosis not present

## 2021-11-25 DIAGNOSIS — E8881 Metabolic syndrome: Secondary | ICD-10-CM | POA: Diagnosis not present

## 2021-11-25 DIAGNOSIS — I10 Essential (primary) hypertension: Secondary | ICD-10-CM | POA: Diagnosis not present

## 2021-11-25 DIAGNOSIS — R82998 Other abnormal findings in urine: Secondary | ICD-10-CM | POA: Diagnosis not present

## 2021-11-25 DIAGNOSIS — R7301 Impaired fasting glucose: Secondary | ICD-10-CM | POA: Diagnosis not present

## 2021-11-25 DIAGNOSIS — I739 Peripheral vascular disease, unspecified: Secondary | ICD-10-CM | POA: Diagnosis not present

## 2021-11-25 DIAGNOSIS — Z1331 Encounter for screening for depression: Secondary | ICD-10-CM | POA: Diagnosis not present

## 2021-11-25 DIAGNOSIS — J439 Emphysema, unspecified: Secondary | ICD-10-CM | POA: Diagnosis not present

## 2021-11-25 DIAGNOSIS — I714 Abdominal aortic aneurysm, without rupture, unspecified: Secondary | ICD-10-CM | POA: Diagnosis not present

## 2021-11-25 DIAGNOSIS — Z9861 Coronary angioplasty status: Secondary | ICD-10-CM | POA: Diagnosis not present

## 2021-11-26 ENCOUNTER — Ambulatory Visit (INDEPENDENT_AMBULATORY_CARE_PROVIDER_SITE_OTHER): Payer: Medicare Other | Admitting: Cardiothoracic Surgery

## 2021-11-26 ENCOUNTER — Encounter: Payer: Self-pay | Admitting: Cardiothoracic Surgery

## 2021-11-26 VITALS — BP 159/86 | HR 67 | Resp 18 | Ht 67.0 in | Wt 154.0 lb

## 2021-11-26 DIAGNOSIS — I7121 Aneurysm of the ascending aorta, without rupture: Secondary | ICD-10-CM

## 2021-11-26 DIAGNOSIS — M314 Aortic arch syndrome [Takayasu]: Secondary | ICD-10-CM

## 2021-11-26 NOTE — Progress Notes (Signed)
PCP is Ginger Organ., MD ?Referring Provider is Ginger Organ., MD ? ?Chief Complaint  ?Patient presents with  ? Thoracic Aortic Aneurysm  ?  1 year f/u with Chest CT 11/23/21  ? ? ?HPI: ?The patient returns for follow-up exam with surveillance CT scan of the chest as follow-up for an asymptomatic fusiform ascending aneurysm noted at approximately 4.4 cm since 2020.  Patient had a CABG in 2017.  At that time the aortic diameter was 4.2 cm.  He was noted to have a trileaflet aortic valve at that time.  He is hypertensive and is on medications lisinopril 5 mg daily.  He remains asymptomatic. ? ?I personally reviewed his CTA images today and the aorta remains stable at 4.4 cm.  Pulmonary windows show no at risk pulmonary nodules or mass. ? ?Patient has his blood pressure checked about once a month.  At home it usually runs 125/75 range. ? ? ?Past Medical History:  ?Diagnosis Date  ? AAA (abdominal aortic aneurysm)   ? a. 05/2019 U/S: 3.1cm AAA  ? Ascending aortic aneurysm   ? a. 10/2019 CTA Chest: 4.3cm asc TAA; b. 10/2020 CTA Chest: 4.2cm asc TAA.  ? Chronic renal disease, stage III ([place])   ? Coronary artery disease   ? a. MI 2000 s/p PCI and 2 stent placement to unknown arteries;  b. LHC 08/09/2016 o-pLAD 30%, mLAD 99%, OM2 50%, OM3 85%, pRCA 99%, mid RCA 60%, RPDA 60%, LVEDP mod elevated. CABG in 07/2016 :  LIMA to LAD, SVG to RCA , SVG to LCX; c. 10/2019 MV: No ischemia or scar. Low risk.  ? Diabetes mellitus without complication ([place])   ? Hyperlipidemia   ? Hypertension   ? Hypothyroidism   ? Macular degeneration   ? bilateral  ? Metabolic syndrome   ? MI (myocardial infarction) ([place])   ? Peripheral neuropathy   ? Peripheral vascular disease ([place])   ? ? ?Past Surgical History:  ?Procedure Laterality Date  ? CARDIAC CATHETERIZATION  2000  ? [facility] x2 stents  ? CARDIAC CATHETERIZATION N/A 08/09/2016  ? Procedure: Left Heart Cath and Coronary Angiography;  Surgeon: Wellington Hampshire, MD;  Location: [place]  [facility];  Service: Cardiovascular;  Laterality: N/A;  ? CORONARY ARTERY BYPASS GRAFT N/A 08/11/2016  ? Procedure: CORONARY ARTERY BYPASS GRAFTING on pump using left internal mammary artery to left anterior descending coronary artery , portion of right greater saphenous vein to right coronary artery, portion of right greater saphenous vein graft to distal circumflex.;  Surgeon: [name], MD;  Location: [place];  Service: Open Heart Surgery;  Laterality: N/A;  ? CORONARY STENT PLACEMENT    ? ENDOVEIN HARVEST OF GREATER SAPHENOUS VEIN Right 08/11/2016  ? Procedure: ENDOVEIN HARVEST OF GREATER SAPHENOUS VEIN;  Surgeon: [name], MD;  Location: [place];  Service: Open Heart Surgery;  Laterality: Right;  ? TEE WITHOUT CARDIOVERSION N/A 08/11/2016  ? Procedure: TRANSESOPHAGEAL ECHOCARDIOGRAM (TEE);  Surgeon: [name], MD;  Location: [place];  Service: Open Heart Surgery;  Laterality: N/A;  ? ? ?Family History  ?Problem Relation Age of Onset  ? Hypertension Mother   ? Prostate cancer Father   ? Diabetes Sister   ? ? ?Social History ?Social History  ? ?Tobacco Use  ? Smoking status: Former  ?  Years: 40.00  ?  Types: Cigarettes  ?  Quit date: 02/06/1999  ?  Years since quitting: 22.8  ? Smokeless tobacco: Never  ?  Substance Use Topics  ? Alcohol use: Yes  ?  Comment: occasional  ? Drug use: No  ? ? ?Current Outpatient Medications  ?Medication Sig Dispense Refill  ? aspirin EC 81 MG EC tablet Take 1 tablet (81 mg total) by mouth daily.    ? Cholecalciferol (VITAMIN D3 PO) Take 6,000 Units by mouth. Taking 1 capsule daily    ? ezetimibe (ZETIA) 10 MG tablet Take 1 tablet (10 mg total) by mouth daily. 30 tablet 1  ? fenofibrate 160 MG tablet Take 160 mg by mouth.     ? gabapentin (NEURONTIN) 600 MG tablet Take 600 mg by mouth 3 (three) times daily.    ? levothyroxine (SYNTHROID) 50 MCG tablet Take 50 mcg by mouth daily.    ? lisinopril (ZESTRIL) 5 MG tablet Take 1 tablet by mouth once daily 90 tablet 2  ?  loratadine-pseudoephedrine (CLARITIN-D 24-HOUR) 10-240 MG 24 hr tablet Take 1 tablet by mouth daily.    ? Multiple Vitamins-Minerals (ZINC PO) Take by mouth. Taking 2 capsules daily    ? multivitamin-lutein (OCUVITE-LUTEIN) CAPS capsule Take 1 capsule by mouth 2 (two) times daily.    ? rosuvastatin (CRESTOR) 5 MG tablet Take 1 tablet (5 mg total) by mouth daily. 30 tablet 11  ? sildenafil (REVATIO) 20 MG tablet Take 60-80 mg by mouth as directed.    ? ?No current facility-administered medications for this visit.  ? ? ?Allergies  ?Allergen Reactions  ? Lidocaine Anaphylaxis  ?  novacaine and lidocaine  ? Quinolones   ?  Patient was warned about not using Cipro and similar antibiotics. ?Recent studies have raised concern that fluoroquinolone antibiotics could be associated with an increased risk of aortic aneurysm ?Fluoroquinolones have non-antimicrobial properties that might jeopardise the integrity of the extracellular matrix of the vascular wall ?In a  propensity score matched cohort study in [place], there was a 66% increased rate of aortic aneurysm or dissection associated with oral fluoroquinolone use, compared wit  ? Statins Nausea And Vomiting  ?  GI symptoms on almost all statins  ? ? ?Review of Systems: ? ?Weight stable ?Decreased vision from macular degeneration but still functions well ?No difficulty swallowing ?No presyncope or dizziness ?No chest pain or shortness of breath ?Intermittent epigastric discomfort associated with reflux, spicy food ?No peripheral edema ?No change in bowel habits ? ?BP (!) 159/86   Pulse 67   Resp 18   Ht '5\' 7"'$  (1.702 m)   Wt 154 lb (69.9 kg)   SpO2 96% Comment: RA  BMI 24.12 kg/m?  ?Physical Exam: ?Recheck blood pressure 145/72 ?  ?  ?  General- alert and comfortable ?   Neck- no JVD, no cervical adenopathy palpable, no carotid bruit ?  Lungs- clear without rales, wheezes ?  Cor- regular rate and rhythm, no murmur , gallop.  Well-healed sternal incision. ?  Abdomen-  soft, non-tender ?  Extremities - warm, non-tender, minimal edema ?  Neuro- oriented, appropriate, no focal weakness ? ? ?Diagnostic Tests: ?CT scan performed today shows stable fusiform ascending aneurysm.  The diameter remains at 4.3/4.4 cm.  This has been stable since 2020 ? ? ?Impression: ?Small stable asymptomatic fusiform ascending aneurysm 4.4 cm. ?CAD, history of CABG by Dr. Servando Snare 20 2017 ?Hypertension controlled on medication ? ?Plan: ?Patient will continue to be followed with annual surveillance scans and exam.  He knows his blood pressure should be kept less than 751 systolic.  He is encouraged to have his blood pressure  checked more frequently and to continue to be compliant with his medication for blood pressure. ? ?Patient will be scheduled for surveillance CT scan and exam in 1 year. ? ?Dahlia Byes, MD ?[facility] ?[phone] ? ? ? ? ? ?

## 2021-12-18 ENCOUNTER — Other Ambulatory Visit: Payer: Self-pay | Admitting: Cardiovascular Disease

## 2021-12-18 DIAGNOSIS — E785 Hyperlipidemia, unspecified: Secondary | ICD-10-CM

## 2022-01-13 ENCOUNTER — Ambulatory Visit (INDEPENDENT_AMBULATORY_CARE_PROVIDER_SITE_OTHER): Payer: Medicare Other

## 2022-01-13 DIAGNOSIS — I714 Abdominal aortic aneurysm, without rupture, unspecified: Secondary | ICD-10-CM

## 2022-01-13 DIAGNOSIS — I7121 Aneurysm of the ascending aorta, without rupture: Secondary | ICD-10-CM

## 2022-01-14 ENCOUNTER — Telehealth: Payer: Self-pay | Admitting: *Deleted

## 2022-01-14 NOTE — Telephone Encounter (Signed)
No answer. Voicemail full.

## 2022-01-14 NOTE — Telephone Encounter (Signed)
-----   Message from Theora Gianotti, NP sent at 01/13/2022  5:08 PM EDT ----- Stable abdominal aortic ultrasound with largest measurement of 3.3 cm.  Follow-up in 1 year.

## 2022-01-15 ENCOUNTER — Other Ambulatory Visit: Payer: Self-pay | Admitting: Nurse Practitioner

## 2022-01-15 DIAGNOSIS — E785 Hyperlipidemia, unspecified: Secondary | ICD-10-CM

## 2022-01-18 NOTE — Telephone Encounter (Signed)
No answer/Voicemail box is full.  

## 2022-01-19 ENCOUNTER — Encounter: Payer: Self-pay | Admitting: *Deleted

## 2022-01-19 ENCOUNTER — Ambulatory Visit: Admit: 2022-01-19 | Payer: PRIVATE HEALTH INSURANCE | Admitting: Ophthalmology

## 2022-01-19 ENCOUNTER — Ambulatory Visit: Payer: Medicare Other | Admitting: Nurse Practitioner

## 2022-01-19 ENCOUNTER — Encounter: Payer: Self-pay | Admitting: Nurse Practitioner

## 2022-01-19 SURGERY — PHACOEMULSIFICATION, CATARACT, WITH IOL INSERTION
Anesthesia: Topical | Laterality: Right

## 2022-01-19 NOTE — Telephone Encounter (Signed)
Patient has appointment here in the office today.

## 2022-01-19 NOTE — Telephone Encounter (Signed)
No answer/Voicemail box is full.  

## 2022-01-19 NOTE — Progress Notes (Unsigned)
Office Visit    Patient Name: Thomas Fuentes Date of Encounter: 01/19/2022  Primary Care Provider:  Ginger Organ., MD Primary Cardiologist:  Kathlyn Sacramento, MD  Chief Complaint    81 year old male with a history of CAD status post non-STEMI and CABG in December 2017, hypertension, hyperlipidemia, type 2 diabetes mellitus, ascending aortic and abdominal aneurysms, stage III chronic kidney disease, and cataracts, who presents for follow-up of CAD.  Past Medical History    Past Medical History:  Diagnosis Date   AAA (abdominal aortic aneurysm) ([place])    a. 05/2019 U/S: 3.1cm AAA; b. 12/2021 Abd Ao U/S: 3.3 cm AAA.   Ascending aortic aneurysm ([place])    a. 10/2019 CTA Chest: 4.3cm asc TAA; b. 10/2020 CTA Chest: 4.2cm asc TAA; c. 10/2021 CTA Chest: 4.4cm TAA.   Chronic renal disease, stage III ([facility])    Coronary artery disease    a. MI 2000 s/p PCI and 2 stent placement to unknown arteries;  b. LHC 08/09/2016 o-pLAD 30%, mLAD 99%, OM2 50%, OM3 85%, pRCA 99%, mid RCA 60%, RPDA 60%, LVEDP mod elevated. CABG in 07/2016 :  LIMA to LAD, SVG to RCA , SVG to LCX; c. 10/2019 MV: No ischemia or scar. Low risk.   Diabetes mellitus without complication ([place])    Hyperlipidemia    Hypertension    Hypothyroidism    Macular degeneration    bilateral   Metabolic syndrome    MI (myocardial infarction) ([place])    Peripheral neuropathy    Peripheral vascular disease ([place])    Past Surgical History:  Procedure Laterality Date   CARDIAC CATHETERIZATION  2000   [facility] x2 stents   CARDIAC CATHETERIZATION N/A 08/09/2016   Procedure: Left Heart Cath and Coronary Angiography;  Surgeon: Wellington Hampshire, MD;  Location: [place] [facility];  Service: Cardiovascular;  Laterality: N/A;   CORONARY ARTERY BYPASS GRAFT N/A 08/11/2016   Procedure: CORONARY ARTERY BYPASS GRAFTING on pump using left internal mammary artery to left anterior descending coronary artery , portion of right greater saphenous vein to right  coronary artery, portion of right greater saphenous vein graft to distal circumflex.;  Surgeon: [name], MD;  Location: [place];  Service: Open Heart Surgery;  Laterality: N/A;   CORONARY STENT PLACEMENT     ENDOVEIN HARVEST OF GREATER SAPHENOUS VEIN Right 08/11/2016   Procedure: ENDOVEIN HARVEST OF GREATER SAPHENOUS VEIN;  Surgeon: [name], MD;  Location: [place];  Service: Open Heart Surgery;  Laterality: Right;   TEE WITHOUT CARDIOVERSION N/A 08/11/2016   Procedure: TRANSESOPHAGEAL ECHOCARDIOGRAM (TEE);  Surgeon: [name], MD;  Location: [place];  Service: Open Heart Surgery;  Laterality: N/A;    Allergies  Allergies  Allergen Reactions   Lidocaine Anaphylaxis    novacaine and lidocaine   Quinolones     Patient was warned about not using Cipro and similar antibiotics. Recent studies have raised concern that fluoroquinolone antibiotics could be associated with an increased risk of aortic aneurysm Fluoroquinolones have non-antimicrobial properties that might jeopardise the integrity of the extracellular matrix of the vascular wall In a  propensity score matched cohort study in [place], there was a 66% increased rate of aortic aneurysm or dissection associated with oral fluoroquinolone use, compared wit   Statins Nausea And Vomiting    GI symptoms on almost all statins    History of Present Illness    81 year old male with the above past medical history including CAD, hypertension,  hyperlipidemia, type 2 diabetes mellitus, ascending aortic aneurysm, abdominal aortic aneurysm, and stage III chronic kidney disease.  Cardiac history dates back to 2000, when he suffered an MI and underwent PCI and stent placement.  Unfortunately, he suffered a non-STEMI December 2017, and underwent diagnostic catheterization revealing severe mid LAD, OM 3, and RCA disease.  He subsequently underwent CABG x3.  Stress testing in March 2021 in the setting of chest pain, showed no ischemia or  scar, and was deemed low risk.  He has been followed closely by CT surgery in the setting of ascending aortic aneurysm.  CTA of the chest in March 2023 showed slight growth in the aneurysm to 4.4 cm (previously 4.3 cm).  Recent abdominal aortic ultrasound showed stable AAA at 3.3 cm.  Thomas Fuentes was last seen in cardiology clinic in April 2022, at which time he was doing well.  Home Medications    Current Outpatient Medications  Medication Sig Dispense Refill   aspirin EC 81 MG EC tablet Take 1 tablet (81 mg total) by mouth daily.     Cholecalciferol (VITAMIN D3 PO) Take 6,000 Units by mouth. Taking 1 capsule daily     ezetimibe (ZETIA) 10 MG tablet Take 1 tablet (10 mg total) by mouth daily. 30 tablet 1   fenofibrate 160 MG tablet Take 160 mg by mouth.      gabapentin (NEURONTIN) 600 MG tablet Take 600 mg by mouth 3 (three) times daily.     levothyroxine (SYNTHROID) 50 MCG tablet Take 50 mcg by mouth daily.     lisinopril (ZESTRIL) 5 MG tablet Take 1 tablet by mouth once daily 90 tablet 2   loratadine-pseudoephedrine (CLARITIN-D 24-HOUR) 10-240 MG 24 hr tablet Take 1 tablet by mouth daily.     Multiple Vitamins-Minerals (ZINC PO) Take by mouth. Taking 2 capsules daily     multivitamin-lutein (OCUVITE-LUTEIN) CAPS capsule Take 1 capsule by mouth 2 (two) times daily.     rosuvastatin (CRESTOR) 5 MG tablet Take 1 tablet by mouth once daily 30 tablet 0   sildenafil (REVATIO) 20 MG tablet Take 60-80 mg by mouth as directed.     No current facility-administered medications for this visit.     Review of Systems    ***.  All other systems reviewed and are otherwise negative except as noted above.    Physical Exam    VS:  There were no vitals taken for this visit. , BMI There is no height or weight on file to calculate BMI.     GEN: Well nourished, well developed, in no acute distress. HEENT: normal. Neck: Supple, no JVD, carotid bruits, or masses. Cardiac: RRR, no murmurs, rubs, or  gallops. No clubbing, cyanosis, edema.  Radials/DP/PT 2+ and equal bilaterally.  Respiratory:  Respirations regular and unlabored, clear to auscultation bilaterally. GI: Soft, nontender, nondistended, BS + x 4. MS: no deformity or atrophy. Skin: warm and dry, no rash. Neuro:  Strength and sensation are intact. Psych: Normal affect.  Accessory Clinical Findings    ECG personally reviewed by me today - *** - no acute changes.  Lab Results  Component Value Date   WBC 9.0 10/31/2019   HGB 13.8 10/31/2019   HCT 43.1 10/31/2019   MCV 92.7 10/31/2019   PLT 216 10/31/2019   Lab Results  Component Value Date   CREATININE 1.31 (H) 10/31/2019   BUN 19 10/31/2019   NA 137 10/31/2019   K 4.4 10/31/2019   CL 100 10/31/2019   CO2  23 10/31/2019   Lab Results  Component Value Date   ALT 12 (L) 01/05/2017   AST 20 01/05/2017   ALKPHOS 36 (L) 01/05/2017   BILITOT 0.8 01/05/2017   Lab Results  Component Value Date   CHOL 111 01/05/2017   HDL 38 (L) 01/05/2017   LDLCALC 53 01/05/2017   TRIG 102 01/05/2017   CHOLHDL 2.9 01/05/2017    Lab Results  Component Value Date   HGBA1C 6.0 (H) 08/10/2016    Assessment & Plan    1.  ***   Murray Hodgkins, NP 01/19/2022, 1:26 PM

## 2022-01-19 NOTE — Telephone Encounter (Signed)
Patient did not show for his appointment today and we have made several attempts to call for review of his results. Sending letter to patient with this information.

## 2022-02-02 ENCOUNTER — Ambulatory Visit: Admit: 2022-02-02 | Payer: PRIVATE HEALTH INSURANCE | Admitting: Ophthalmology

## 2022-02-02 SURGERY — PHACOEMULSIFICATION, CATARACT, WITH IOL INSERTION
Anesthesia: Topical | Laterality: Left

## 2022-02-11 ENCOUNTER — Other Ambulatory Visit: Payer: Self-pay | Admitting: Nurse Practitioner

## 2022-02-11 DIAGNOSIS — E785 Hyperlipidemia, unspecified: Secondary | ICD-10-CM

## 2022-02-22 ENCOUNTER — Other Ambulatory Visit: Payer: Self-pay | Admitting: Nurse Practitioner

## 2022-02-22 DIAGNOSIS — I7143 Infrarenal abdominal aortic aneurysm, without rupture: Secondary | ICD-10-CM

## 2022-03-11 DIAGNOSIS — H353212 Exudative age-related macular degeneration, right eye, with inactive choroidal neovascularization: Secondary | ICD-10-CM | POA: Diagnosis not present

## 2022-03-11 DIAGNOSIS — H2513 Age-related nuclear cataract, bilateral: Secondary | ICD-10-CM | POA: Diagnosis not present

## 2022-03-18 ENCOUNTER — Other Ambulatory Visit
Admission: RE | Admit: 2022-03-18 | Discharge: 2022-03-18 | Disposition: A | Discharge status: Home / Self Care | Payer: Medicare Other | Attending: Nurse Practitioner | Admitting: Nurse Practitioner

## 2022-03-18 ENCOUNTER — Ambulatory Visit (INDEPENDENT_AMBULATORY_CARE_PROVIDER_SITE_OTHER): Payer: Medicare Other | Admitting: Nurse Practitioner

## 2022-03-18 ENCOUNTER — Encounter: Payer: Self-pay | Admitting: Nurse Practitioner

## 2022-03-18 VITALS — BP 100/50 | HR 75 | Ht 67.5 in | Wt 151.2 lb

## 2022-03-18 DIAGNOSIS — I714 Abdominal aortic aneurysm, without rupture, unspecified: Secondary | ICD-10-CM

## 2022-03-18 DIAGNOSIS — I2510 Atherosclerotic heart disease of native coronary artery without angina pectoris: Secondary | ICD-10-CM | POA: Insufficient documentation

## 2022-03-18 DIAGNOSIS — N183 Chronic kidney disease, stage 3 unspecified: Secondary | ICD-10-CM

## 2022-03-18 DIAGNOSIS — I7121 Aneurysm of the ascending aorta, without rupture: Secondary | ICD-10-CM | POA: Diagnosis not present

## 2022-03-18 DIAGNOSIS — I4819 Other persistent atrial fibrillation: Secondary | ICD-10-CM

## 2022-03-18 DIAGNOSIS — I10 Essential (primary) hypertension: Secondary | ICD-10-CM | POA: Insufficient documentation

## 2022-03-18 DIAGNOSIS — E039 Hypothyroidism, unspecified: Secondary | ICD-10-CM | POA: Diagnosis not present

## 2022-03-18 DIAGNOSIS — E785 Hyperlipidemia, unspecified: Secondary | ICD-10-CM | POA: Insufficient documentation

## 2022-03-18 LAB — CBC
HCT: 40.1 % (ref 39.0–52.0)
Hemoglobin: 12.9 g/dL — ABNORMAL LOW (ref 13.0–17.0)
MCH: 29.6 pg (ref 26.0–34.0)
MCHC: 32.2 g/dL (ref 30.0–36.0)
MCV: 92 fL (ref 80.0–100.0)
Platelets: 227 10*3/uL (ref 150–400)
RBC: 4.36 MIL/uL (ref 4.22–5.81)
RDW: 14 % (ref 11.5–15.5)
WBC: 7 10*3/uL (ref 4.0–10.5)
nRBC: 0 % (ref 0.0–0.2)

## 2022-03-18 LAB — COMPREHENSIVE METABOLIC PANEL
ALT: 12 U/L (ref 0–44)
AST: 17 U/L (ref 15–41)
Albumin: 4.1 g/dL (ref 3.5–5.0)
Alkaline Phosphatase: 29 U/L — ABNORMAL LOW (ref 38–126)
Anion gap: 7 (ref 5–15)
BUN: 24 mg/dL — ABNORMAL HIGH (ref 8–23)
CO2: 25 mmol/L (ref 22–32)
Calcium: 9.7 mg/dL (ref 8.9–10.3)
Chloride: 107 mmol/L (ref 98–111)
Creatinine, Ser: 1.31 mg/dL — ABNORMAL HIGH (ref 0.61–1.24)
GFR, Estimated: 54 mL/min — ABNORMAL LOW (ref >60)
Glucose, Bld: 93 mg/dL (ref 70–99)
Potassium: 4.3 mmol/L (ref 3.5–5.1)
Sodium: 139 mmol/L (ref 135–145)
Total Bilirubin: 0.7 mg/dL (ref 0.3–1.2)
Total Protein: 7.6 g/dL (ref 6.5–8.1)

## 2022-03-18 LAB — LIPID PANEL
Cholesterol: 118 mg/dL (ref 0–200)
HDL: 31 mg/dL — ABNORMAL LOW (ref >40)
LDL Cholesterol: 60 mg/dL (ref 0–99)
Total CHOL/HDL Ratio: 3.8 RATIO
Triglycerides: 133 mg/dL (ref <150)
VLDL: 27 mg/dL (ref 0–40)

## 2022-03-18 LAB — TSH: TSH: 2.26 u[IU]/mL (ref 0.350–4.500)

## 2022-03-18 LAB — MAGNESIUM: Magnesium: 2.1 mg/dL (ref 1.7–2.4)

## 2022-03-18 LAB — LDL CHOLESTEROL, DIRECT: Direct LDL: 70 mg/dL (ref 0–99)

## 2022-03-18 MED ORDER — APIXABAN 5 MG PO TABS: 5.0000 mg | ORAL_TABLET | Freq: Two times a day (BID) | ORAL | 6 refills | Status: DC

## 2022-03-18 NOTE — Patient Instructions (Addendum)
Medication Instructions:  - Your physician has recommended you make the following change in your medication:   1) START Eliquis 5 mg: - take 1 tablet by mouth TWICE daily (or every 12 hours)  2) STOP Aspirin  Samples Given: Eliquis 5 mg Lot: [id number] Exp: 11/2023 # 3 boxes    *If you need a refill on your cardiac medications before your next appointment, please call your pharmacy*   Lab Work: - Your physician recommends that you have lab work today:  CBC/ CMET/ Lipid/ Direct LDL/ Magnesium/ TSH  Nature conservation officer at [facility] 1st desk on the right to check in (REGISTRATION)  Lab hours: Monday- Friday (7:30 am- 5:30 pm)   If you have labs (blood work) drawn today and your tests are completely normal, you will receive your results only by: MyChart Message (if you have MyChart) OR A paper copy in the mail If you have any lab test that is abnormal or we need to change your treatment, we will call you to review the results.   Testing/Procedures:  1) Echocardiogram: - Your physician has requested that you have an echocardiogram. Echocardiography is a painless test that uses sound waves to create images of your heart. It provides your doctor with information about the size and shape of your heart and how well your heart's chambers and valves are working. This procedure takes approximately one hour. There are no restrictions for this procedure. There is a possibility that an IV may need to be started during your test to inject an image enhancing agent. This is done to obtain more optimal pictures of your heart. Therefore we ask that you do at least drink some water prior to coming in to hydrate your veins.     Follow-Up: At [facility], you and your health needs are our priority.  As part of our continuing mission to provide you with exceptional heart care, we have created designated Provider Care Teams.  These Care Teams include your primary Cardiologist (physician) and Advanced  Practice Providers (APPs -  Physician Assistants and Nurse Practitioners) who all work together to provide you with the care you need, when you need it.  We recommend signing up for the patient portal called "MyChart".  Sign up information is provided on this After Visit Summary.  MyChart is used to connect with patients for Virtual Visits (Telemedicine).  Patients are able to view lab/test results, encounter notes, upcoming appointments, etc.  Non-urgent messages can be sent to your provider as well.   To learn more about what you can do with MyChart, go to [email or web address].    Your next appointment:   6 week(s)  The format for your next appointment:   In Person  Provider:   You may see Kathlyn Sacramento, MD or one of the following Advanced Practice Providers on your designated Care Team:   Murray Hodgkins, NP     Other Instructions  Apixaban Tablets What is this medication? APIXABAN (a PIX a ban) prevents or treats blood clots. It is also used to lower the risk of stroke in people with AFib (atrial fibrillation). It belongs to a group of medications called blood thinners. This medicine may be used for other purposes; ask your health care provider or pharmacist if you have questions. COMMON BRAND NAME(S): Eliquis What should I tell my care team before I take this medication? They need to know if you have any of these conditions: Antiphospholipid antibody syndrome Bleeding disorder History of bleeding in the brain  History of blood clots History of stomach bleeding Kidney disease Liver disease Mechanical heart valve Spinal surgery An unusual or allergic reaction to apixaban, other medications, foods, dyes, or preservatives Pregnant or trying to get pregnant Breast-feeding How should I use this medication? Take this medication by mouth. For your therapy to work as well as possible, take each dose exactly as prescribed on the prescription label. Do not skip doses. Skipping  doses or stopping this medication can increase your risk of a blood clot or stroke. Keep taking this medication unless your care team tells you to stop. Take it as directed on the prescription label at the same time every day. You can take it with or without food. If it upsets your stomach, take it with food. A special MedGuide will be given to you by the pharmacist with each prescription and refill. Be sure to read this information carefully each time. Talk to your care team about the use of this medication in children. Special care may be needed. Overdosage: If you think you have taken too much of this medicine contact a poison control center or emergency room at once. NOTE: This medicine is only for you. Do not share this medicine with others. What if I miss a dose? If you miss a dose, take it as soon as you can. If it is almost time for your next dose, take only that dose. Do not take double or extra doses. What may interact with this medication? This medication may interact with the following: Aspirin and aspirin-like medications Certain medications for fungal infections like itraconazole and ketoconazole Certain medications for seizures like carbamazepine and phenytoin Certain medications for blood clots like enoxaparin, dalteparin, heparin, and warfarin Clarithromycin NSAIDs, medications for pain and inflammation, like ibuprofen or naproxen Rifampin Ritonavir St. John's wort This list may not describe all possible interactions. Give your health care provider a list of all the medicines, herbs, non-prescription drugs, or dietary supplements you use. Also tell them if you smoke, drink alcohol, or use illegal drugs. Some items may interact with your medicine. What should I watch for while using this medication? Visit your healthcare professional for regular checks on your progress. You may need blood work done while you are taking this medication. Your condition will be monitored carefully  while you are receiving this medication. It is important not to miss any appointments. Avoid sports and activities that might cause injury while you are using this medication. Severe falls or injuries can cause unseen bleeding. Be careful when using sharp tools or knives. Consider using an Copy. Take special care brushing or flossing your teeth. Report any injuries, bruising, or red spots on the skin to your healthcare professional. If you are going to need surgery or other procedure, tell your healthcare professional that you are taking this medication. Wear a medical ID bracelet or chain. Carry a card that describes your disease and details of your medication and dosage times. What side effects may I notice from receiving this medication? Side effects that you should report to your care team as soon as possible: Allergic reactions--skin rash, itching, hives, swelling of the face, lips, tongue, or throat Bleeding--bloody or black, tar-like stools, vomiting blood or brown material that looks like coffee grounds, red or dark brown urine, small red or purple spots on the skin, unusual bruising or bleeding Bleeding in the brain--severe headache, stiff neck, confusion, dizziness, change in vision, numbness or weakness of the face, arm, or leg, trouble speaking, trouble walking,  vomiting Heavy periods This list may not describe all possible side effects. Call your doctor for medical advice about side effects. You may report side effects to FDA at 1-800-FDA-1088. Where should I keep my medication? Keep out of the reach of children and pets. Store at room temperature between 20 and 25 degrees C (68 and 77 degrees F). Get rid of any unused medication after the expiration date. To get rid of medications that are no longer needed or expired: Take the medication to a medication take-back program. Check with your pharmacy or law enforcement to find a location. If you cannot return the medication, check  the label or package insert to see if the medication should be thrown out in the garbage or flushed down the toilet. If you are not sure, ask your care team. If it is safe to put in the trash, empty the medication out of the container. Mix the medication with cat litter, dirt, coffee grounds, or other unwanted substance. Seal the mixture in a bag or container. Put it in the trash. NOTE: This sheet is a summary. It may not cover all possible information. If you have questions about this medicine, talk to your doctor, pharmacist, or health care provider.  2023 Elsevier/Gold Standard (2020-09-12 00:00:00)    Echocardiogram An echocardiogram is a test that uses sound waves (ultrasound) to produce images of the heart. Images from an echocardiogram can provide important information about: Heart size and shape. The size and thickness and movement of your heart's walls. Heart muscle function and strength. Heart valve function or if you have stenosis. Stenosis is when the heart valves are too narrow. If blood is flowing backward through the heart valves (regurgitation). A tumor or infectious growth around the heart valves. Areas of heart muscle that are not working well because of poor blood flow or injury from a heart attack. Aneurysm detection. An aneurysm is a weak or damaged part of an artery wall. The wall bulges out from the normal force of blood pumping through the body. Tell a health care provider about: Any allergies you have. All medicines you are taking, including vitamins, herbs, eye drops, creams, and over-the-counter medicines. Any blood disorders you have. Any surgeries you have had. Any medical conditions you have. Whether you are pregnant or may be pregnant. What are the risks? Generally, this is a safe test. However, problems may occur, including an allergic reaction to dye (contrast) that may be used during the test. What happens before the test? No specific preparation is  needed. You may eat and drink normally. What happens during the test?  You will take off your clothes from the waist up and put on a hospital gown. Electrodes or electrocardiogram (ECG)patches may be placed on your chest. The electrodes or patches are then connected to a device that monitors your heart rate and rhythm. You will lie down on a table for an ultrasound exam. A gel will be applied to your chest to help sound waves pass through your skin. A handheld device, called a transducer, will be pressed against your chest and moved over your heart. The transducer produces sound waves that travel to your heart and bounce back (or "echo" back) to the transducer. These sound waves will be captured in real-time and changed into images of your heart that can be viewed on a video monitor. The images will be recorded on a computer and reviewed by your health care provider. You may be asked to change positions or hold  your breath for a short time. This makes it easier to get different views or better views of your heart. In some cases, you may receive contrast through an IV in one of your veins. This can improve the quality of the pictures from your heart. The procedure may vary among health care providers and hospitals. What can I expect after the test? You may return to your normal, everyday life, including diet, activities, and medicines, unless your health care provider tells you not to do that. Follow these instructions at home: It is up to you to get the results of your test. Ask your health care provider, or the department that is doing the test, when your results will be ready. Keep all follow-up visits. This is important. Summary An echocardiogram is a test that uses sound waves (ultrasound) to produce images of the heart. Images from an echocardiogram can provide important information about the size and shape of your heart, heart muscle function, heart valve function, and other possible heart  problems. You do not need to do anything to prepare before this test. You may eat and drink normally. After the echocardiogram is completed, you may return to your normal, everyday life, unless your health care provider tells you not to do that. This information is not intended to replace advice given to you by your health care provider. Make sure you discuss any questions you have with your health care provider. Document Revised: 04/29/2021 Document Reviewed: 04/08/2020 Elsevier Patient Education  [place]

## 2022-03-18 NOTE — Progress Notes (Signed)
Office Visit    Patient Name: Thomas Fuentes Date of Encounter: 03/18/2022  Primary Care Provider:  Ginger Organ., MD Primary Cardiologist:  Kathlyn Sacramento, MD  Chief Complaint    81 year old male with history of CAD status post non-STEMI and CABG in December 2017, hypertension, hyperlipidemia, ascending aortic and abdominal aneurysms, stage III chronic kidney disease, and cataracts, who presents for follow-up of CAD.  Past Medical History    Past Medical History:  Diagnosis Date   AAA (abdominal aortic aneurysm) ([place])    a. 05/2019 U/S: 3.1cm AAA; b. 12/2021 Abd Ao U/S: 3.3 cm AAA.   Ascending aortic aneurysm ([place])    a. 10/2019 CTA Chest: 4.3cm asc TAA; b. 10/2020 CTA Chest: 4.2cm asc TAA; c. 10/2021 CTA Chest: 4.4cm TAA.   Chronic renal disease, stage III (HCC)    Coronary artery disease    a. MI 2000 s/p PCI and 2 stent placement to unknown arteries;  b. LHC 08/09/2016 o-pLAD 30%, mLAD 99%, OM2 50%, OM3 85%, pRCA 99%, mid RCA 60%, RPDA 60%, LVEDP mod elevated. CABG in 07/2016 :  LIMA to LAD, SVG to RCA , SVG to LCX; c. 10/2019 MV: No ischemia or scar. Low risk.   Hyperlipidemia    Hypertension    Hypothyroidism    Macular degeneration    bilateral   Metabolic syndrome    MI (myocardial infarction) ([place])    Peripheral neuropathy    Peripheral vascular disease ([place])    Past Surgical History:  Procedure Laterality Date   CARDIAC CATHETERIZATION  2000   [facility] x2 stents   CARDIAC CATHETERIZATION N/A 08/09/2016   Procedure: Left Heart Cath and Coronary Angiography;  Surgeon: Wellington Hampshire, MD;  Location: [place] [facility];  Service: Cardiovascular;  Laterality: N/A;   CORONARY ARTERY BYPASS GRAFT N/A 08/11/2016   Procedure: CORONARY ARTERY BYPASS GRAFTING on pump using left internal mammary artery to left anterior descending coronary artery , portion of right greater saphenous vein to right coronary artery, portion of right greater saphenous vein graft to distal  circumflex.;  Surgeon: [name], MD;  Location: [place];  Service: Open Heart Surgery;  Laterality: N/A;   CORONARY STENT PLACEMENT     ENDOVEIN HARVEST OF GREATER SAPHENOUS VEIN Right 08/11/2016   Procedure: ENDOVEIN HARVEST OF GREATER SAPHENOUS VEIN;  Surgeon: [name], MD;  Location: [place];  Service: Open Heart Surgery;  Laterality: Right;   TEE WITHOUT CARDIOVERSION N/A 08/11/2016   Procedure: TRANSESOPHAGEAL ECHOCARDIOGRAM (TEE);  Surgeon: [name], MD;  Location: [place];  Service: Open Heart Surgery;  Laterality: N/A;    Allergies  Allergies  Allergen Reactions   Lidocaine Anaphylaxis    novacaine and lidocaine   Meloxicam Other (See Comments)   Quinolones     Patient was warned about not using Cipro and similar antibiotics. Recent studies have raised concern that fluoroquinolone antibiotics could be associated with an increased risk of aortic aneurysm Fluoroquinolones have non-antimicrobial properties that might jeopardise the integrity of the extracellular matrix of the vascular wall In a  propensity score matched cohort study in [place], there was a 66% increased rate of aortic aneurysm or dissection associated with oral fluoroquinolone use, compared wit   Statins Nausea And Vomiting    GI symptoms on almost all statins Other reaction(s): GI intolerance (Lipitor, Zocor)    History of Present Illness    81 year old male with above complex past medical history including CAD, hypertension, hyperlipidemia,  ascending aortic aneurysm, abdominal aortic aneurysm, and stage III chronic kidney disease.  Cardiac history dates back to 2000, when he suffered an MI and underwent PCI and stent placement.  Unfortunately, he suffered a non-STEMI in December 2017, and underwent diagnostic catheterization revealing severe mid LAD, OM 3, and RCA disease.  He subsequently underwent CABG x3.  Stress testing in March 2021, in the setting of chest pain, showed no ischemia or scar,  and was deemed low risk.  He has been followed closely by CT surgery in the setting of ascending aortic aneurysm.  CTA of the chest in March 2023 showed slight growth and aneurysm to 4.4 cm (previously 4.3 cm).  Abdominal aortic ultrasound in May 2023 showed a stable AAA at 3.3 cm.  Mr. Bollig was last seen in cardiology clinic in April 2022, at which time he was doing well.  Over the past year, Mr. Bodey notes that he has cont to feel exceptionally well.  He is very active and enjoys working outside for long hours.  He denies chest pain, dyspnea, palpitations, PND, orthopnea, dizziness, syncope, edema, or early satiety.  Today, he is in rate controlled atrial fibrillation at 75 bpm.  Upon further questioning, he says that a few months ago, when checking his blood pressure, he noted that his resting heart rate was around 80.  Though he was asymptomatic, he noted that was higher than what he is accustomed to seeing.  He checked it for a few days and continues to note resting rates in the 80s and then just stop checking.  He is completely asymptomatic.  Home Medications    Current Outpatient Medications  Medication Sig Dispense Refill   Cholecalciferol (VITAMIN D3 PO) Take 6,000 Units by mouth. Taking 1 capsule daily     ezetimibe (ZETIA) 10 MG tablet Take 1 tablet (10 mg total) by mouth daily. 30 tablet 1   fenofibrate 160 MG tablet Take 160 mg by mouth.      gabapentin (NEURONTIN) 600 MG tablet Take 600 mg by mouth 3 (three) times daily.     levothyroxine (SYNTHROID) 50 MCG tablet Take 50 mcg by mouth daily.     lisinopril (ZESTRIL) 5 MG tablet Take 1 tablet by mouth once daily 90 tablet 2   loratadine-pseudoephedrine (CLARITIN-D 24-HOUR) 10-240 MG 24 hr tablet Take 1 tablet by mouth daily.     Multiple Vitamins-Minerals (ZINC PO) Take by mouth. Taking 2 capsules daily     multivitamin-lutein (OCUVITE-LUTEIN) CAPS capsule Take 1 capsule by mouth 2 (two) times daily.     rosuvastatin (CRESTOR) 5  MG tablet Take 1 tablet by mouth once daily 30 tablet 0   sildenafil (REVATIO) 20 MG tablet Take 60-80 mg by mouth as directed.     apixaban (ELIQUIS) 5 MG TABS tablet Take 1 tablet (5 mg total) by mouth 2 (two) times daily. 60 tablet 6   No current facility-administered medications for this visit.     Review of Systems    Remains active and says he feels stronger than ever.  He denies chest pain, palpitations, dyspnea, pnd, orthopnea, n, v, dizziness, syncope, edema, weight gain, or early satiety.  All other systems reviewed and are otherwise negative except as noted above.    Physical Exam    VS:  BP (!) 100/50 (BP Location: Left Arm, Patient Position: Sitting, Cuff Size: Normal)   Pulse 75   Ht 5' 7.5" (1.715 m)   Wt 151 lb 4 oz (68.6 kg)  SpO2 98%   BMI 23.34 kg/m  , BMI Body mass index is 23.34 kg/m.     GEN: Well nourished, well developed, in no acute distress. HEENT: normal. Neck: Supple, no JVD, carotid bruits, or masses. Cardiac: IR, IR, no murmurs, rubs, or gallops. No clubbing, cyanosis, edema.  Radials/PT 2+ and equal bilaterally.  Respiratory:  Respirations regular and unlabored, clear to auscultation bilaterally. GI: Soft, nontender, nondistended, BS + x 4. MS: no deformity or atrophy. Skin: warm and dry, no rash. Neuro:  Strength and sensation are intact. Psych: Normal affect.  Accessory Clinical Findings    ECG personally reviewed by me today -atrial fibrillation, 75- no acute changes.  Lab Results  Component Value Date   WBC 9.0 10/31/2019   HGB 13.8 10/31/2019   HCT 43.1 10/31/2019   MCV 92.7 10/31/2019   PLT 216 10/31/2019   Lab Results  Component Value Date   CREATININE 1.31 (H) 10/31/2019   BUN 19 10/31/2019   NA 137 10/31/2019   K 4.4 10/31/2019   CL 100 10/31/2019   CO2 23 10/31/2019   Lab Results  Component Value Date   ALT 12 (L) 01/05/2017   AST 20 01/05/2017   ALKPHOS 36 (L) 01/05/2017   BILITOT 0.8 01/05/2017   Lab Results   Component Value Date   CHOL 111 01/05/2017   HDL 38 (L) 01/05/2017   LDLCALC 53 01/05/2017   TRIG 102 01/05/2017   CHOLHDL 2.9 01/05/2017    Lab Results  Component Value Date   HGBA1C 6.0 (H) 08/10/2016    Assessment & Plan    1.  Persistent atrial fibrillation: Prior history of paroxysmal atrial fibrillation following bypass surgery in 2007 but this had otherwise been quiescent.  Patient notes that several months ago, he noted a slight increase in his resting heart rate to around 80, though he has never noted any symptoms.  He remains very active without chest pain, dyspnea, palpitations, presyncope, or syncope.  He is well rate controlled at 75 bpm in the absence of an AV nodal blocking agent.  CHA2DS2-VASc equals 4.  I will stop his aspirin and start Eliquis 5 mg twice daily.  Follow-up CBC, metabolic panel, and TSH today.  We will arrange for an echo.  Given excellent rate control and absence of symptoms, we agreed to pursue a conservative rate control approach.  2.  Coronary artery disease: Status post CABG in 2017.  Low risk Myoview in 2021.  He has remained active without chest pain or dyspnea.  He is on statin, Zetia, and ACE inhibitor therapy.  Discontinuing aspirin in the setting of need for Eliquis.  3.  Essential hypertension: Blood pressure soft at 100/50 on lisinopril therapy.  Asymptomatic.  No changes to medications today.  4.  Hyperlipidemia: On statin and Zetia therapy.  Follow-up lipids and LFTs.  5.  Abdominal aortic aneurysm: Stable by abdominal ultrasound in May 2023 at 3.3 cm.  6.  Ascending aortic aneurysm: Relatively stable at 4.4 cm in March.  Followed by CT surgery.  7.  Stage III chronic kidney disease: Follow-up basic metabolic panel today in the setting of starting Eliquis.  8.  Hypothyroidism: Follow-up TSH.  Patient is on Synthroid.  9.  Disposition: Follow-up CBC, complete metabolic panel, magnesium, TSH, fasting lipids, and echocardiogram.  Follow-up  in clinic in approximately 6 weeks or sooner if necessary.   Murray Hodgkins, NP 03/18/2022, 10:07 AM

## 2022-03-19 ENCOUNTER — Other Ambulatory Visit: Payer: Self-pay | Admitting: Nurse Practitioner

## 2022-03-19 ENCOUNTER — Ambulatory Visit (INDEPENDENT_AMBULATORY_CARE_PROVIDER_SITE_OTHER): Payer: Medicare Other

## 2022-03-19 DIAGNOSIS — E785 Hyperlipidemia, unspecified: Secondary | ICD-10-CM

## 2022-03-19 DIAGNOSIS — I4819 Other persistent atrial fibrillation: Secondary | ICD-10-CM

## 2022-03-19 LAB — ECHOCARDIOGRAM COMPLETE
AR max vel: 3.66 cm2
AV Area VTI: 4.17 cm2
AV Area mean vel: 3.67 cm2
AV Mean grad: 2 mmHg
AV Peak grad: 4.3 mmHg
Ao pk vel: 1.04 m/s
Area-P 1/2: 4.1 cm2
S' Lateral: 3.3 cm
Single Plane A4C EF: 50.9 %

## 2022-04-02 IMAGING — CT CT CHEST W/O CM
2 of 5 series · 15 of 36 positions shown, 18 images · non-contrast
Comparison: Chest CT 11/20/2020

CLINICAL DATA: Aortic aneurysm, known or suspected



[Series 4: chest 2.00 br40 s3 · coronal · 0.68mm/px · 3 of 153 slices shown]
[im 31/153  lung]
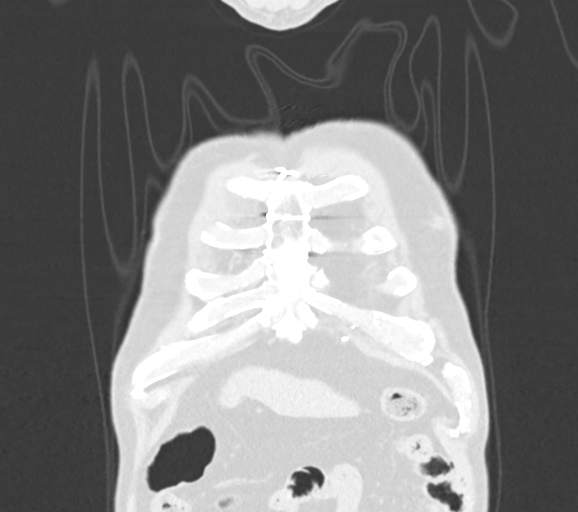
[im 61/153  lung]
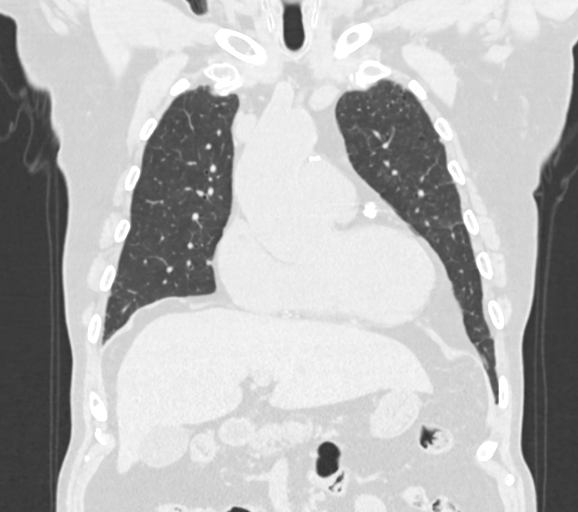
[im 92/153  lung]
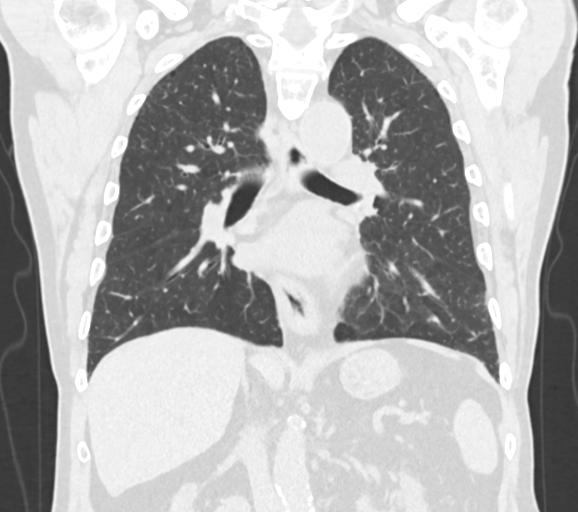

[Series 10: chest 1.00 br40 s3 super d · axial · 0.71mm/px · z∈[+1526,+1827]mm · 12 of 434 slices shown, 15 images]
[im 29/434  mediastinal]
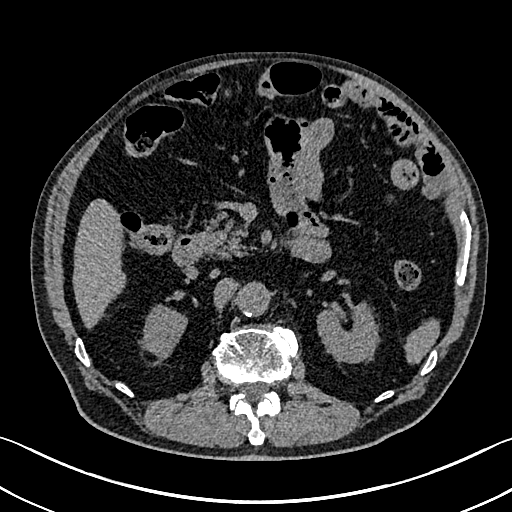
[im 29/434  lung]
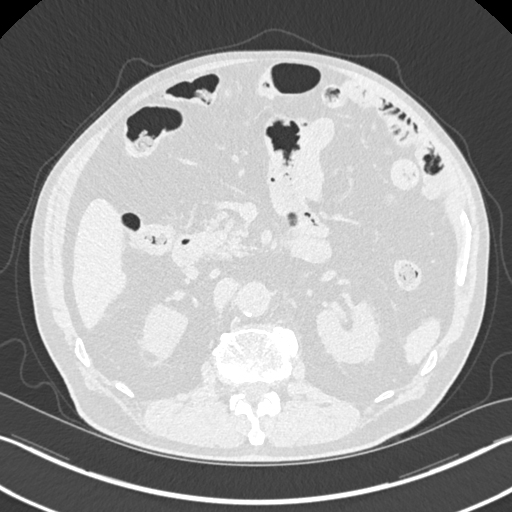
[im 58/434  lung]
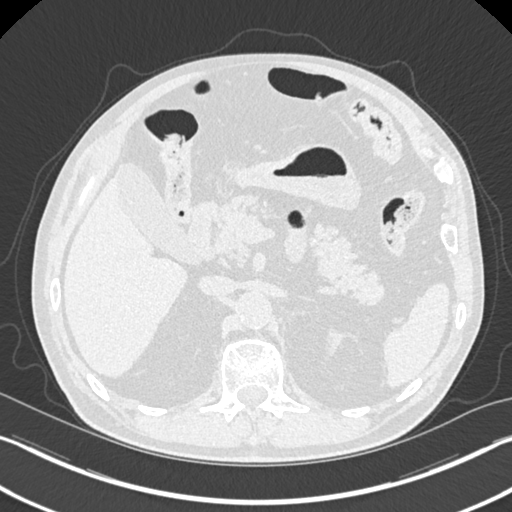
[im 87/434  lung]
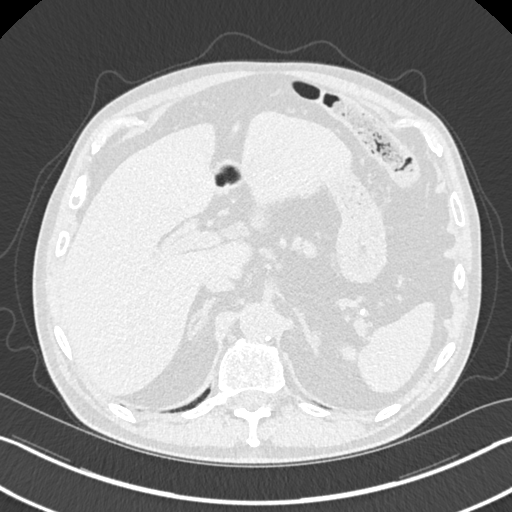
[im 145/434  lung]
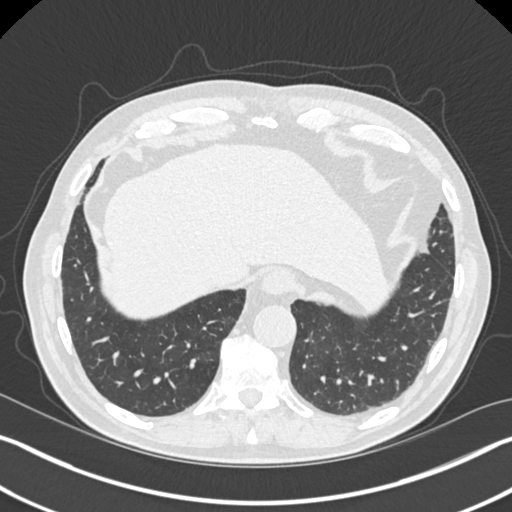
[im 174/434  mediastinal]
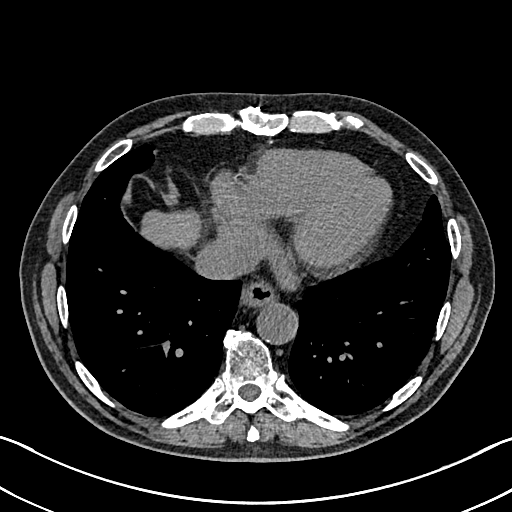
[im 174/434  lung]
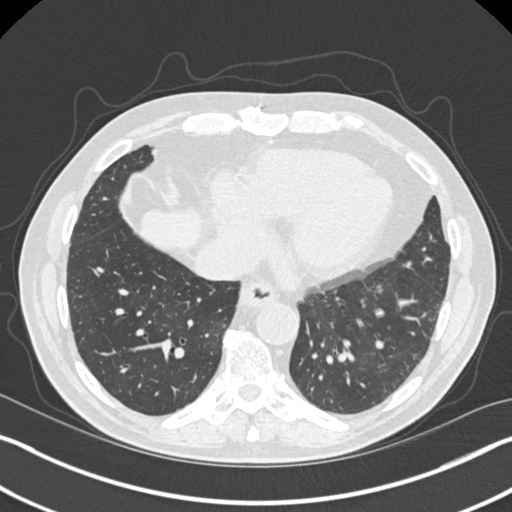
[im 203/434  lung]
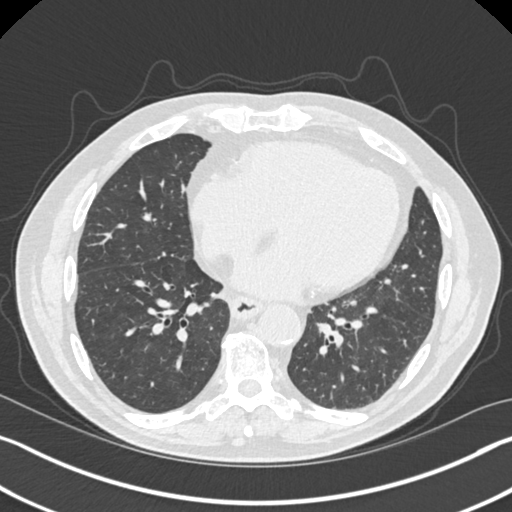
[im 231/434  lung]
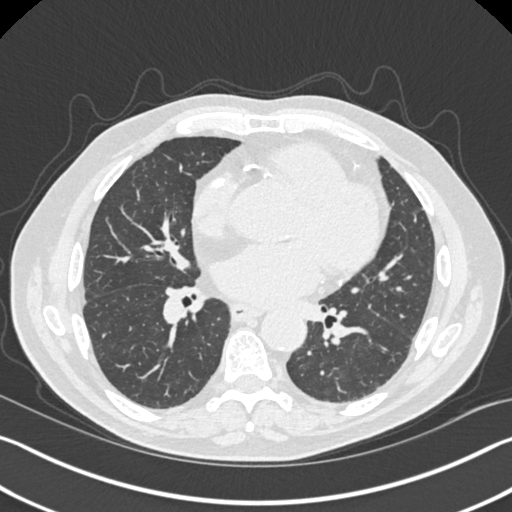
[im 260/434  lung]
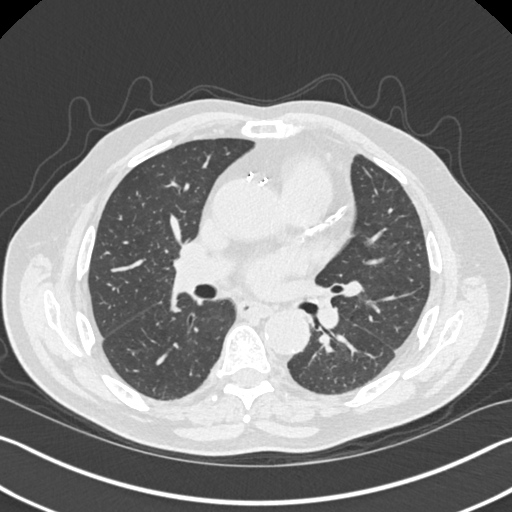
[im 289/434  mediastinal]
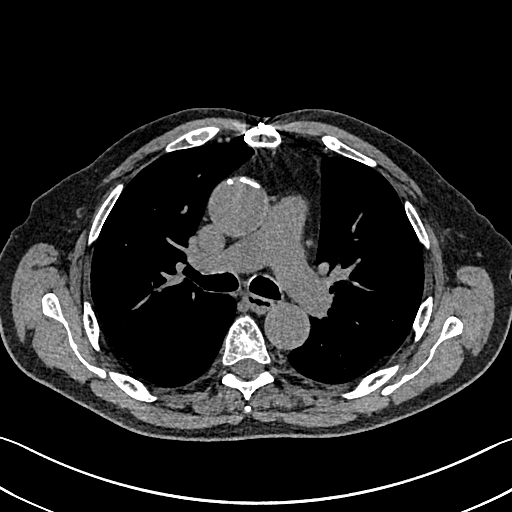
[im 289/434  lung]
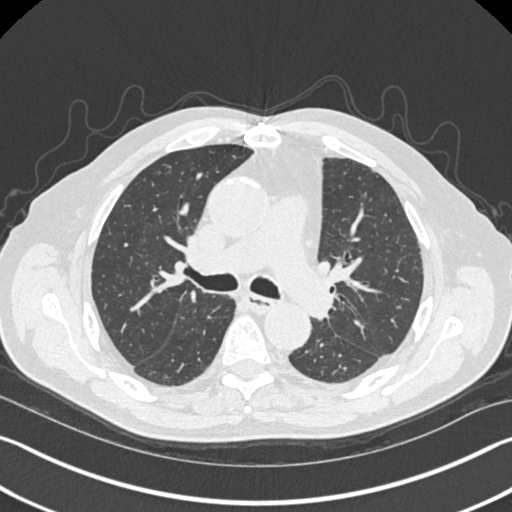
[im 347/434  lung]
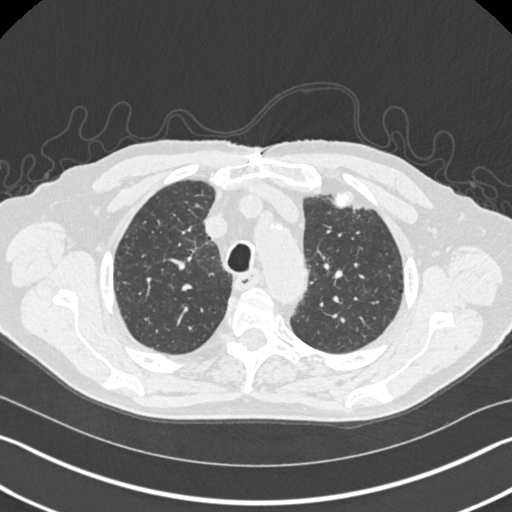
[im 376/434  lung]
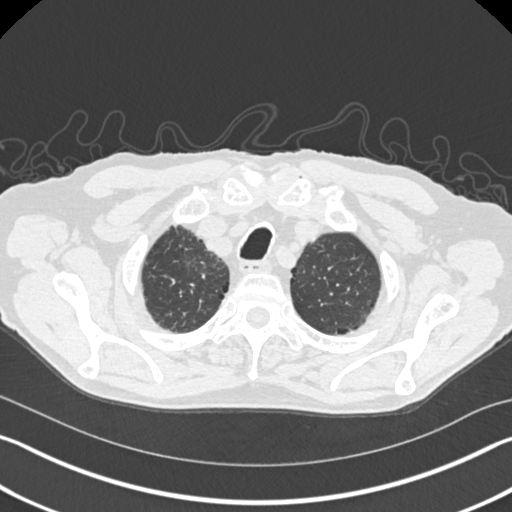
[im 405/434  lung]
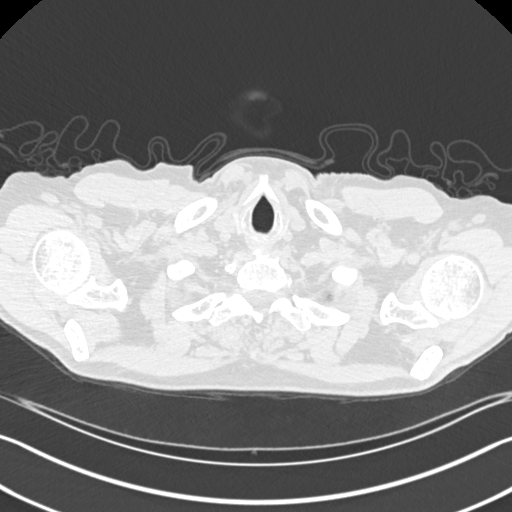

[15 of 36 positions shown; findings below may reference images not displayed]

FINDINGS: Cardiovascular: The ascending aorta measures up to 4.4 cm,
previously 4.3 cm (series 2, image 64). Prior CABG. Native coronary
artery calcification. Normal cardiac size. No pericardial disease.
Normal size main and branch pulmonary arteries.

Mediastinum/Nodes: No lymphadenopathy. Thyroid is unremarkable. The
trachea is unremarkable.

Lungs/Pleura: There is no focal airspace consolidation. Mild
paraseptal emphysema. Pulmonary parenchymal changes suggestive of
chronic smoking related lung disease. Mild bronchial wall
thickening. No suspicious pulmonary nodules or masses.

Upper Abdomen: No acute abnormality.

Musculoskeletal: No chest wall mass or suspicious bone lesions
identified.
IMPRESSION: Ascending aortic aneurysm measures up to 4.4 cm, previously 4.3 cm.
Recommend annual imaging followup by CTA or MRA. This recommendation
follows 2040 ACCF/AHA/AATS/ACR/ASA/SCA/SHAMIR/CALISTUS/JIM/RANTONA Guidelines
for the Diagnosis and Management of Patients with Thoracic Aortic
Disease. Circulation. 2040; 121: [id number]. Aortic aneurysm NOS
(1G258-409.X).

Stable findings of chronic smoking related lung disease.

Aortic Atherosclerosis (1G258-QR2.2) and Emphysema (1G258-1LX.H).

## 2022-04-19 DIAGNOSIS — Z85828 Personal history of other malignant neoplasm of skin: Secondary | ICD-10-CM | POA: Diagnosis not present

## 2022-04-19 DIAGNOSIS — D485 Neoplasm of uncertain behavior of skin: Secondary | ICD-10-CM | POA: Diagnosis not present

## 2022-04-19 DIAGNOSIS — D225 Melanocytic nevi of trunk: Secondary | ICD-10-CM | POA: Diagnosis not present

## 2022-04-19 DIAGNOSIS — L821 Other seborrheic keratosis: Secondary | ICD-10-CM | POA: Diagnosis not present

## 2022-04-19 DIAGNOSIS — L814 Other melanin hyperpigmentation: Secondary | ICD-10-CM | POA: Diagnosis not present

## 2022-04-19 DIAGNOSIS — L570 Actinic keratosis: Secondary | ICD-10-CM | POA: Diagnosis not present

## 2022-04-19 DIAGNOSIS — L578 Other skin changes due to chronic exposure to nonionizing radiation: Secondary | ICD-10-CM | POA: Diagnosis not present

## 2022-04-19 DIAGNOSIS — C44319 Basal cell carcinoma of skin of other parts of face: Secondary | ICD-10-CM | POA: Diagnosis not present

## 2022-04-22 ENCOUNTER — Other Ambulatory Visit: Payer: Self-pay | Admitting: Nurse Practitioner

## 2022-04-22 DIAGNOSIS — E785 Hyperlipidemia, unspecified: Secondary | ICD-10-CM

## 2022-05-04 ENCOUNTER — Encounter: Payer: Self-pay | Admitting: Nurse Practitioner

## 2022-05-04 ENCOUNTER — Ambulatory Visit: Payer: Medicare Other | Attending: Nurse Practitioner | Admitting: Nurse Practitioner

## 2022-05-04 VITALS — BP 112/68 | HR 76 | Ht 67.5 in | Wt 149.8 lb

## 2022-05-04 DIAGNOSIS — I2510 Atherosclerotic heart disease of native coronary artery without angina pectoris: Secondary | ICD-10-CM | POA: Insufficient documentation

## 2022-05-04 DIAGNOSIS — I4819 Other persistent atrial fibrillation: Secondary | ICD-10-CM | POA: Diagnosis not present

## 2022-05-04 DIAGNOSIS — I714 Abdominal aortic aneurysm, without rupture, unspecified: Secondary | ICD-10-CM | POA: Insufficient documentation

## 2022-05-04 DIAGNOSIS — Z79899 Other long term (current) drug therapy: Secondary | ICD-10-CM | POA: Diagnosis not present

## 2022-05-04 DIAGNOSIS — I7121 Aneurysm of the ascending aorta, without rupture: Secondary | ICD-10-CM | POA: Insufficient documentation

## 2022-05-04 DIAGNOSIS — E785 Hyperlipidemia, unspecified: Secondary | ICD-10-CM | POA: Diagnosis not present

## 2022-05-04 DIAGNOSIS — I10 Essential (primary) hypertension: Secondary | ICD-10-CM | POA: Insufficient documentation

## 2022-05-04 MED ORDER — WARFARIN SODIUM 5 MG PO TABS
5.0000 mg | ORAL_TABLET | Freq: Every day | ORAL | 1 refills | Status: DC
Start: 2022-05-04 — End: 2022-10-20

## 2022-05-04 NOTE — Patient Instructions (Addendum)
Medication Instructions:   - Your physician has recommended you make the following change in your medication:   1) STOP Eliquis after taking your last tablet  2) The next day, START Coumadin (warfarin) 5 mg: - take 1 tablet by mouth once daily at night  *If you need a refill on your cardiac medications before your next appointment, please call your pharmacy*   Lab Work: - Your physician recommends that you have lab work today:   BMP/ [place] Entrance at [facility] 1st desk on the right to check in (REGISTRATION)  Lab hours: Monday- Friday (7:30 am- 5:30 pm)  If you have labs (blood work) drawn today and your tests are completely normal, you will receive your results only by: MyChart Message (if you have MyChart) OR A paper copy in the mail If you have any lab test that is abnormal or we need to change your treatment, we will call you to review the results.   Testing/Procedures: - none ordered   Follow-Up: At [facility], you and your health needs are our priority.  As part of our continuing mission to provide you with exceptional heart care, we have created designated Provider Care Teams.  These Care Teams include your primary Cardiologist (physician) and Advanced Practice Providers (APPs -  Physician Assistants and Nurse Practitioners) who all work together to provide you with the care you need, when you need it.  We recommend signing up for the patient portal called "MyChart".  Sign up information is provided on this After Visit Summary.  MyChart is used to connect with patients for Virtual Visits (Telemedicine).  Patients are able to view lab/test results, encounter notes, upcoming appointments, etc.  Non-urgent messages can be sent to your provider as well.   To learn more about what you can do with MyChart, go to [email or web address].    Your next appointment:    1) New Coumadin Appointment- Wednesday 05/19/22  2) 2-3 months with Dr. Fletcher Anon (only)  The  format for your next appointment:   In Person  Provider:   As above  Other Instructions N/a  Important Information About Sugar

## 2022-05-04 NOTE — Progress Notes (Signed)
Office Visit    Patient Name: Thomas Fuentes Date of Encounter: 05/04/2022  Primary Care Provider:  Ginger Organ., MD Primary Cardiologist:  Kathlyn Sacramento, MD  Chief Complaint    81 year old male with history of CAD status post non-STEMI and CABG in December 2017, hypertension, hyperlipidemia, ascending aortic and abdominal aneurysms, stage III chronic kidney disease, and cataracts, who presents for follow-up of CAD.  Past Medical History    Past Medical History:  Diagnosis Date   AAA (abdominal aortic aneurysm) ([place])    a. 05/2019 U/S: 3.1cm AAA; b. 12/2021 Abd Ao U/S: 3.3 cm AAA.   Ascending aortic aneurysm ([place])    a. 10/2019 CTA Chest: 4.3cm asc TAA; b. 10/2020 CTA Chest: 4.2cm asc TAA; c. 10/2021 CTA Chest: 4.4cm TAA.   Chronic renal disease, stage III (HCC)    Coronary artery disease    a. MI 2000 s/p PCI and 2 stent placement to unknown arteries;  b. LHC 08/09/2016 o-pLAD 30%, mLAD 99%, OM2 50%, OM3 85%, pRCA 99%, mid RCA 60%, RPDA 60%, LVEDP mod elevated. CABG in 07/2016 :  LIMA to LAD, SVG to RCA , SVG to LCX; c. 10/2019 MV: No ischemia or scar. Low risk.   History of echocardiogram    a. 02/2022 Echo: EF 50-55%, no rwma, nl RV fxn, mod dil LA, mild MR. Ao sclerosis. Ao root 63m. Asc Ao 396m   Hyperlipidemia    Hypertension    Hypothyroidism    Macular degeneration    bilateral   Metabolic syndrome    [place]   Peripheral neuropathy    [place]ipheral vascular disease ([place]   Past Surgical History:  Procedure Laterality Date   CARDIAC CATHETERIZATION  2000   [facility] x2 stents   CARDIAC CATHETERIZATION N/A 08/09/2016   Procedure: Left Heart Cath and Coronary Angiography;  Surgeon: MuWellington HampshireMD;  [facility];  Service: Cardiovascular;  Laterality: N/A;   CORONARY ARTERY BYPASS GRAFT N/A 08/11/2016   Procedure: CORONARY ARTERY BYPASS GRAFTING on pump using left internal mammary artery to left anterior descending  coronary artery , portion of right greater saphenous vein to right coronary artery, portion of right greater saphenous vein graft to distal circumflex.;  Surgeon: EdGrace IsaacMD;  [facility]: [facility];  Laterality: N/A;   CORONARY STENT PLACEMENT     ENDOVEIN HARVEST OF GREATER SAPHENOUS VEIN Right 08/11/2016   Procedure: ENDOVEIN HARVEST OF GREATER SAPHENOUS VEIN;  Surgeon: EdGrace IsaacMD;  [facility]: [facility];  Laterality: Right;   TEE WITHOUT CARDIOVERSION N/A 08/11/2016   Procedure: TRANSESOPHAGEAL ECHOCARDIOGRAM (TEE);  Surgeon: EdGrace IsaacMD;  [place] [facility] Service: Open [facility] Surgery;  Laterality: N/A;    Allergies  Allergies  Allergen Reactions   Lidocaine Anaphylaxis    novacaine and lidocaine   Meloxicam Other (See Comments)   Quinolones     Patient was warned about not using Cipro and similar antibiotics. Recent studies have raised concern that fluoroquinolone antibiotics could be associated with an increased risk of aortic aneurysm Fluoroquinolones have non-antimicrobial properties that might jeopardise the integrity of the extracellular matrix of the vascular wall In a  propensity score matched [place] study in [place] a 66% increased rate of aortic aneurysm or dissection associated with oral fluoroquinolone use, compared wit   Statins Nausea And Vomiting    GI symptoms on  almost all statins Other reaction(s): GI intolerance (Lipitor, Zocor)    History of Present Illness    81 year old male with the above complex past medical history including CAD, hypertension, hyperlipidemia, ascending aortic aneurysm, abdominal aortic aneurysm, and stage III chronic kidney disease.  Cardiac history dates back to 2000, when he suffered an MI and underwent PCI and stent placement.  Unfortunately, he suffered a non-STEMI in December 2017, and underwent diagnostic catheterization revealing severe mid LAD, OM  3, and RCA disease.  He subsequently underwent CABG x3.  Stress testing in March 2021, in the setting of chest pain, showed no ischemia or scar, and was deemed low risk.  He has been followed closely by CT surgery in the setting of ascending aortic aneurysm.  CTA of the chest in March 2023 showed slight growth of the aneurysm to 4.4 cm (previously 4.3 cm).  Abdominal aortic ultrasound in May 2023 showed a stable abdominal aortic aneurysm at 3.3 cm.  Thomas Fuentes was last seen in cardiology clinic and July 2023 at which time he felt well but was noted to be in rate controlled atrial fibrillation at 75 bpm.  This was a new diagnosis for him.  Heart rates at home had been trending a little higher than what he had been accustomed to seeing.  In the setting of excellent rate control and absence of symptoms, we opted to pursue a conservative rate control strategy.  Aspirin was discontinued in favor of Eliquis 5 mg twice daily.  Lab work showed stable H&H and renal function with normal TSH.  This was followed by an echocardiogram that showed an EF of 50 to 55% with moderately dilated left atrium, and mild MR. Since his last visit, Thomas Fuentes has done well.  He does not experience chest pain, dyspnea, or palpitations.  He has tolerated Eliquis and checks his stool regularly and has not noted any bright red blood or melena.  He does not think he will be able to afford Eliquis going forward instead wishes to change to warfarin.  We had a prolonged discussion about INR monitoring today.  He denies PND, orthopnea, dizziness, syncope, edema, or early satiety.  Home Medications    Current Outpatient Medications  Medication Sig Dispense Refill   Cholecalciferol (VITAMIN D3 PO) Take 6,000 Units by mouth. Taking 1 capsule daily     ezetimibe (ZETIA) 10 MG tablet Take 1 tablet (10 mg total) by mouth daily. 30 tablet 1   fenofibrate 160 MG tablet Take 160 mg by mouth.      gabapentin (NEURONTIN) 600 MG tablet Take 600 mg by  mouth 3 (three) times daily.     levothyroxine (SYNTHROID) 50 MCG tablet Take 50 mcg by mouth daily.     lisinopril (ZESTRIL) 5 MG tablet Take 1 tablet by mouth once daily 90 tablet 2   loratadine-pseudoephedrine (CLARITIN-D 24-HOUR) 10-240 MG 24 hr tablet Take 1 tablet by mouth daily.     multivitamin-lutein (OCUVITE-LUTEIN) CAPS capsule Take 1 capsule by mouth 2 (two) times daily.     rosuvastatin (CRESTOR) 5 MG tablet Take 1 tablet by mouth once daily 30 tablet 0   sildenafil (REVATIO) 20 MG tablet Take 60-80 mg by mouth as directed.     VITAMIN E PO Take 1 tablet by mouth daily.     warfarin (COUMADIN) 5 MG tablet Take 1 tablet (5 mg total) by mouth daily. 30 tablet 1   Multiple Vitamins-Minerals (ZINC PO) Take by mouth. Taking 2 capsules daily (Patient  not taking: Reported on 05/04/2022)     No current facility-administered medications for this visit.     Review of Systems    He denies chest pain, palpitations, dyspnea, pnd, orthopnea, n, v, dizziness, syncope, edema, weight gain, or early satiety.  All other systems reviewed and are otherwise negative except as noted above.    Physical Exam    VS:  BP 112/68 (BP Location: Left Arm, Patient Position: Sitting, Cuff Size: Normal)   Pulse 76   Ht 5' 7.5" (1.715 m)   Wt 149 lb 12.8 oz (67.9 kg)   SpO2 94%   BMI 23.12 kg/m  , BMI Body mass index is 23.12 kg/m.     GEN: Well nourished, well developed, in no acute distress. HEENT: normal. Neck: Supple, no JVD, carotid bruits, or masses. Cardiac: Irregularly irregular, no murmurs, rubs, or gallops. No clubbing, cyanosis, edema.  Radials/PT 2+ and equal bilaterally.  Respiratory:  Respirations regular and unlabored, clear to auscultation bilaterally. GI: Soft, nontender, nondistended, BS + x 4. MS: no deformity or atrophy. Skin: warm and dry, no rash. Neuro:  Strength and sensation are intact. Psych: Normal affect.  Accessory Clinical Findings    ECG personally reviewed by me  today -atrial fibrillation, 76- no acute changes.  Lab Results  Component Value Date   WBC 7.0 03/18/2022   HGB 12.9 (L) 03/18/2022   HCT 40.1 03/18/2022   MCV 92.0 03/18/2022   PLT 227 03/18/2022   Lab Results  Component Value Date   CREATININE 1.31 (H) 03/18/2022   BUN 24 (H) 03/18/2022   NA 139 03/18/2022   K 4.3 03/18/2022   CL 107 03/18/2022   CO2 25 03/18/2022   Lab Results  Component Value Date   ALT 12 03/18/2022   AST 17 03/18/2022   ALKPHOS 29 (L) 03/18/2022   BILITOT 0.7 03/18/2022   Lab Results  Component Value Date   CHOL 118 03/18/2022   HDL 31 (L) 03/18/2022   LDLCALC 60 03/18/2022   LDLDIRECT 70 03/18/2022   TRIG 133 03/18/2022   CHOLHDL 3.8 03/18/2022    Lab Results  Component Value Date   HGBA1C 6.0 (H) 08/10/2016   Lab Results  Component Value Date   TSH 2.260 03/18/2022    Assessment & Plan    1.  Persistent atrial fibrillation: Prior history of paroxysmal atrial fibrillation following bypass surgery in 2007 but this had otherwise been quiescent until it was noted at office follow-up on July 20.  He has been rate controlled in the absence of AV nodal blocking agent and is asymptomatic and therefore, we will continue with rate control and anticoagulation.  He has been on Eliquis for the past month and does not believe he will be able to afford this going forward.  He was requested to switch to warfarin.  He has 9 days left on his current Eliquis prescription and when this completes, he will switch ove as its been over to warfarin 5 mg daily with plan for INR follow-up in our Coumadin clinic the subsequent week.  Follow-up CBC today.  Of note, lab work was unremarkable in July with normal TSH.  Echo showed low normal EF at 50-55% with moderately dilated left atrium and mild MR.  2.  Coronary artery disease: Status post CABG in 2017.  Low risk Myoview in 2021.  He remains active without chest pain.  Aspirin was discontinued in the setting of initiation  of oral anticoagulation.  He otherwise remains on  statin, Zetia, and ACE inhibitor therapy.  3.  Essential hypertension: Blood pressure stable on lisinopril therapy.  No changes today.  4.  Hyperlipidemia: On statin and Zetia with an LDL of 70 in July.  5.  Abdominal aortic aneurysm: Stable by abdominal ultrasound in May 2013 at 3.3 cm.  He is followed by vascular surgery.  6.  Ascending aortic aneurysm: Stable at 4.4 cm in March 2023.  Followed by CT surgery.  7.  Stage III chronic kidney disease: Follow-up basic metabolic panel today.  8.  Hypothyroidism: TSH was normal in July.  He remains on levothyroxine therapy.  9.  Disposition: Follow-up CBC and basic metabolic panel.  We will arrange for INR follow-up as he is now switching to warfarin.  Follow-up in clinic in 2 to 3 months or sooner if necessary.  Murray Hodgkins, NP 05/04/2022, 5:42 PM

## 2022-05-05 ENCOUNTER — Other Ambulatory Visit
Admission: RE | Admit: 2022-05-05 | Discharge: 2022-05-05 | Disposition: A | Discharge status: Home / Self Care | Payer: Medicare Other | Attending: Nurse Practitioner | Admitting: Nurse Practitioner

## 2022-05-05 DIAGNOSIS — I4819 Other persistent atrial fibrillation: Secondary | ICD-10-CM | POA: Diagnosis not present

## 2022-05-05 DIAGNOSIS — Z79899 Other long term (current) drug therapy: Secondary | ICD-10-CM | POA: Diagnosis not present

## 2022-05-05 LAB — BASIC METABOLIC PANEL
Anion gap: 8 (ref 5–15)
BUN: 25 mg/dL — ABNORMAL HIGH (ref 8–23)
CO2: 24 mmol/L (ref 22–32)
Calcium: 9.3 mg/dL (ref 8.9–10.3)
Chloride: 103 mmol/L (ref 98–111)
Creatinine, Ser: 1.13 mg/dL (ref 0.61–1.24)
GFR, Estimated: >60 mL/min (ref >60)
Glucose, Bld: 180 mg/dL — ABNORMAL HIGH (ref 70–99)
Potassium: 4.1 mmol/L (ref 3.5–5.1)
Sodium: 135 mmol/L (ref 135–145)

## 2022-05-05 LAB — CBC
HCT: 39.1 % (ref 39.0–52.0)
Hemoglobin: 12.6 g/dL — ABNORMAL LOW (ref 13.0–17.0)
MCH: 29.9 pg (ref 26.0–34.0)
MCHC: 32.2 g/dL (ref 30.0–36.0)
MCV: 92.9 fL (ref 80.0–100.0)
Platelets: 234 10*3/uL (ref 150–400)
RBC: 4.21 MIL/uL — ABNORMAL LOW (ref 4.22–5.81)
RDW: 13.9 % (ref 11.5–15.5)
WBC: 8.9 10*3/uL (ref 4.0–10.5)
nRBC: 0 % (ref 0.0–0.2)

## 2022-05-06 ENCOUNTER — Telehealth: Payer: Self-pay | Admitting: *Deleted

## 2022-05-06 DIAGNOSIS — C44319 Basal cell carcinoma of skin of other parts of face: Secondary | ICD-10-CM | POA: Diagnosis not present

## 2022-05-06 NOTE — Telephone Encounter (Signed)
No answer/Voicemail box is full.  

## 2022-05-06 NOTE — Telephone Encounter (Signed)
-----   Message from Theora Gianotti, NP sent at 05/05/2022  6:46 PM EDT ----- Kidney function and electrolytes are stable.  Blood counts are stable.

## 2022-05-12 ENCOUNTER — Other Ambulatory Visit: Payer: Self-pay

## 2022-05-12 ENCOUNTER — Ambulatory Visit: Payer: Medicare Other | Attending: Internal Medicine

## 2022-05-12 DIAGNOSIS — I4891 Unspecified atrial fibrillation: Secondary | ICD-10-CM | POA: Insufficient documentation

## 2022-05-12 DIAGNOSIS — Z5181 Encounter for therapeutic drug level monitoring: Secondary | ICD-10-CM | POA: Insufficient documentation

## 2022-05-12 DIAGNOSIS — Z7901 Long term (current) use of anticoagulants: Secondary | ICD-10-CM | POA: Insufficient documentation

## 2022-05-12 DIAGNOSIS — I4819 Other persistent atrial fibrillation: Secondary | ICD-10-CM

## 2022-05-12 LAB — POCT INR: INR: 2.9 (ref 2.0–3.0)

## 2022-05-12 NOTE — Patient Instructions (Signed)
Take 0.5 tablet Daily, Except 1 Tablet on Monday and Friday.  INR in 1 week.  A full discussion of the nature of anticoagulants has been carried out.  A benefit risk analysis has been presented to the patient, so that they understand the justification for choosing anticoagulation at this time. The need for frequent and regular monitoring, precise dosage adjustment and compliance is stressed.  Side effects of potential bleeding are discussed.  The patient should avoid any OTC items containing aspirin or ibuprofen, and should avoid great swings in general diet.  Avoid alcohol consumption.  Call if any signs of abnormal bleeding.  [phone]

## 2022-05-13 ENCOUNTER — Other Ambulatory Visit: Payer: Self-pay | Admitting: Nurse Practitioner

## 2022-05-13 DIAGNOSIS — E785 Hyperlipidemia, unspecified: Secondary | ICD-10-CM

## 2022-05-14 NOTE — Telephone Encounter (Signed)
Reviewed results with patient and he verbalized understanding with no further questions at this time. 

## 2022-05-19 ENCOUNTER — Ambulatory Visit: Payer: Medicare Other | Attending: Cardiovascular Disease

## 2022-05-19 DIAGNOSIS — I4819 Other persistent atrial fibrillation: Secondary | ICD-10-CM

## 2022-05-19 DIAGNOSIS — Z7901 Long term (current) use of anticoagulants: Secondary | ICD-10-CM

## 2022-05-19 LAB — POCT INR: INR: 4.8 — AB (ref 2.0–3.0)

## 2022-05-19 NOTE — Patient Instructions (Signed)
HOLD TODAY, THURSDAY, FRIDAY and SATURDAY and then DECREASE TO 0.5 tablet Daily.   INR in 1 week.  [phone]

## 2022-05-26 ENCOUNTER — Ambulatory Visit: Payer: Medicare Other | Attending: Cardiovascular Disease

## 2022-05-26 DIAGNOSIS — I4819 Other persistent atrial fibrillation: Secondary | ICD-10-CM | POA: Insufficient documentation

## 2022-05-26 DIAGNOSIS — Z7901 Long term (current) use of anticoagulants: Secondary | ICD-10-CM | POA: Insufficient documentation

## 2022-05-26 LAB — POCT INR: INR: 2 (ref 2.0–3.0)

## 2022-05-26 NOTE — Patient Instructions (Signed)
Continue 0.5 tablet Daily.   INR in 1 week.  [phone]

## 2022-06-02 ENCOUNTER — Ambulatory Visit: Payer: Medicare Other | Attending: Cardiovascular Disease

## 2022-06-02 DIAGNOSIS — I4819 Other persistent atrial fibrillation: Secondary | ICD-10-CM

## 2022-06-02 DIAGNOSIS — Z5181 Encounter for therapeutic drug level monitoring: Secondary | ICD-10-CM

## 2022-06-02 LAB — POCT INR: INR: 4.9 — AB (ref 2.0–3.0)

## 2022-06-02 NOTE — Patient Instructions (Signed)
HOLD TODAY and THURSDAY ONLY then DECREASE TO 0.5 tablet Daily, EXCEPT NONE ON MONDAY and FRIDAY.   INR in 2 weeks.  [phone]

## 2022-06-03 DIAGNOSIS — H353212 Exudative age-related macular degeneration, right eye, with inactive choroidal neovascularization: Secondary | ICD-10-CM | POA: Diagnosis not present

## 2022-06-16 ENCOUNTER — Ambulatory Visit: Payer: Medicare Other | Attending: Cardiovascular Disease

## 2022-06-16 DIAGNOSIS — I4819 Other persistent atrial fibrillation: Secondary | ICD-10-CM

## 2022-06-16 DIAGNOSIS — Z7901 Long term (current) use of anticoagulants: Secondary | ICD-10-CM

## 2022-06-16 LAB — POCT INR: INR: 2.9 (ref 2.0–3.0)

## 2022-06-16 NOTE — Patient Instructions (Signed)
CONTINUE  0.5 tablet Daily, EXCEPT NONE ON MONDAY and FRIDAY.   INR in 4 weeks.  [phone]

## 2022-06-25 ENCOUNTER — Other Ambulatory Visit: Payer: Self-pay | Admitting: Cardiovascular Disease

## 2022-07-13 ENCOUNTER — Encounter: Payer: Self-pay | Admitting: Cardiovascular Disease

## 2022-07-13 ENCOUNTER — Ambulatory Visit: Payer: Medicare Other | Attending: Cardiovascular Disease | Admitting: Cardiovascular Disease

## 2022-07-13 VITALS — BP 120/70 | HR 71 | Ht 67.5 in | Wt 151.5 lb

## 2022-07-13 DIAGNOSIS — Z7901 Long term (current) use of anticoagulants: Secondary | ICD-10-CM | POA: Insufficient documentation

## 2022-07-13 DIAGNOSIS — I482 Chronic atrial fibrillation, unspecified: Secondary | ICD-10-CM | POA: Insufficient documentation

## 2022-07-13 DIAGNOSIS — I2510 Atherosclerotic heart disease of native coronary artery without angina pectoris: Secondary | ICD-10-CM | POA: Diagnosis not present

## 2022-07-13 DIAGNOSIS — I4819 Other persistent atrial fibrillation: Secondary | ICD-10-CM | POA: Diagnosis not present

## 2022-07-13 DIAGNOSIS — E785 Hyperlipidemia, unspecified: Secondary | ICD-10-CM | POA: Insufficient documentation

## 2022-07-13 DIAGNOSIS — I714 Abdominal aortic aneurysm, without rupture, unspecified: Secondary | ICD-10-CM | POA: Insufficient documentation

## 2022-07-13 DIAGNOSIS — I10 Essential (primary) hypertension: Secondary | ICD-10-CM | POA: Insufficient documentation

## 2022-07-13 NOTE — Patient Instructions (Signed)
Medication Instructions:  No changes *If you need a refill on your cardiac medications before your next appointment, please call your pharmacy*   Lab Work: None ordered If you have labs (blood work) drawn today and your tests are completely normal, you will receive your results only by: [place] (if you have MyChart) OR A paper copy in the mail If you have any lab test that is abnormal or we need to change your treatment, we will call you to review the results.   Testing/Procedures: Your physician has requested that you have an abdominal aorta duplex in May 2024. During this test, an ultrasound is used to evaluate the aorta. Allow 30 minutes for this exam. Do not eat after midnight the day before and avoid carbonated beverages.   Follow-Up: At [facility], you and your health needs are our priority.  As part of our continuing mission to provide you with exceptional heart care, we have created designated Provider Care Teams.  These Care Teams include your primary Cardiologist (physician) and Advanced Practice Providers (APPs -  Physician Assistants and Nurse Practitioners) who all work together to provide you with the care you need, when you need it.  We recommend signing up for the patient portal called "MyChart".  Sign up information is provided on this After Visit Summary.  MyChart is used to connect with patients for Virtual Visits (Telemedicine).  Patients are able to view lab/test results, encounter notes, upcoming appointments, etc.  Non-urgent messages can be sent to your provider as well.   To learn more about what you can do with MyChart, go to [email or web address].    Your next appointment:   Follow up in May after the AAA duplex  Important Information About Sugar

## 2022-07-13 NOTE — Progress Notes (Signed)
Cardiology Office Note   Date:  07/13/2022   ID:  Thomas Fuentes, DOB 06/05/1941, MRN [id number]  PCP:  Ginger Organ., MD  Cardiologist:   Kathlyn Sacramento, MD   Chief Complaint  Patient presents with   Other    2-3 month f/u no complaints today. Meds reviewed verbally with pt.      History of Present Illness: Thomas Fuentes is a 81 y.o. male who presents for a follow-up visit regarding coronary artery disease s/p CABG in 07/2016 after NSTEMI.   He has chronic medical conditions that include, hypertension, hyperlipidemia (intolerant to statins), type 2 diabetes, small ascending aortic aneurysm, small abdominal aortic aneurysm and chronic kidney disease.   He was noted to be in atrial fibrillation in July 2023 but he was asymptomatic.  He was started on anticoagulation with Eliquis.  Echocardiogram showed an EF of 50 to 55% with moderately dilated left atrium and mild mitral regurgitation.  He had financial difficulty affording Eliquis and thus he was switched to warfarin during last visit.  He has been doing well and denies chest pain, shortness of breath or palpitations.  He is very active with no exertional symptoms.  Past Medical History:  Diagnosis Date   AAA (abdominal aortic aneurysm) ([place])    a. 05/2019 U/S: 3.1cm AAA; b. 12/2021 Abd Ao U/S: 3.3 cm AAA.   Ascending aortic aneurysm ([place])    a. 10/2019 CTA Chest: 4.3cm asc TAA; b. 10/2020 CTA Chest: 4.2cm asc TAA; c. 10/2021 CTA Chest: 4.4cm TAA.   Chronic renal disease, stage III (HCC)    Coronary artery disease    a. MI 2000 s/p PCI and 2 stent placement to unknown arteries;  b. LHC 08/09/2016 o-pLAD 30%, mLAD 99%, OM2 50%, OM3 85%, pRCA 99%, mid RCA 60%, RPDA 60%, LVEDP mod elevated. CABG in 07/2016 :  LIMA to LAD, SVG to RCA , SVG to LCX; c. 10/2019 MV: No ischemia or scar. Low risk.   History of echocardiogram    a. 02/2022 Echo: EF 50-55%, no rwma, nl RV fxn, mod dil LA, mild MR. Ao sclerosis. Ao root 48m. Asc Ao 342m    Hyperlipidemia    Hypertension    Hypothyroidism    Macular degeneration    bilateral   Metabolic syndrome    [place]) ([place]   Peripheral neuropathy    [place]ipheral vascular disease ([place]    Past Surgical History:  Procedure Laterality Date   CARDIAC CATHETERIZATION  2000   [facility] x2 stents   CARDIAC CATHETERIZATION N/A 08/09/2016   Procedure: Left Heart Cath and Coronary Angiography;  Surgeon: MuWellington HampshireMD;  [place];  Service: Cardiovascular;  Laterality: N/A;   CORONARY ARTERY BYPASS GRAFT N/A 08/11/2016   Procedure: CORONARY ARTERY BYPASS GRAFTING on pump using left internal mammary artery to left anterior descending coronary artery , portion of right greater saphenous vein to right coronary artery, portion of right greater saphenous vein graft to distal circumflex.;  Surgeon: EdGrace IsaacMD;  [place]: Open [place] Surgery;  Laterality: N/A;   CORONARY STENT PLACEMENT     ENDOVEIN HARVEST OF GREATER SAPHENOUS VEIN Right 08/11/2016   Procedure: ENDOVEIN HARVEST OF GREATER SAPHENOUS VEIN;  Surgeon: EdGrace IsaacMD;  [place] [facility]: [facility];  Laterality: Right;   TEE WITHOUT CARDIOVERSION N/A 08/11/2016   Procedure: TRANSESOPHAGEAL ECHOCARDIOGRAM (TEE);  Surgeon: EdGrace Isaac  MD;  [facility];  Service: Open Heart Surgery;  Laterality: N/A;     Current Outpatient Medications  Medication Sig Dispense Refill   Cholecalciferol (VITAMIN D3 PO) Take 6,000 Units by mouth. Taking 1 capsule daily     ezetimibe (ZETIA) 10 MG tablet Take 1 tablet (10 mg total) by mouth daily. 30 tablet 1   fenofibrate 160 MG tablet Take 160 mg by mouth.      gabapentin (NEURONTIN) 600 MG tablet Take 600 mg by mouth 3 (three) times daily.     levothyroxine (SYNTHROID) 50 MCG tablet Take 50 mcg by mouth daily.     lisinopril (ZESTRIL) 5 MG tablet Take 1 tablet by mouth once daily 90 tablet 0    Multiple Vitamins-Minerals (ICAPS AREDS 2 PO) Take by mouth 2 times daily at 12 noon and 4 pm.     Multiple Vitamins-Minerals (ZINC PO) Take by mouth. Taking 2 capsules daily     rosuvastatin (CRESTOR) 5 MG tablet Take 1 tablet by mouth once daily 30 tablet 3   sildenafil (REVATIO) 20 MG tablet Take 60-80 mg by mouth as directed.     VITAMIN E PO Take 1 tablet by mouth daily.     warfarin (COUMADIN) 5 MG tablet Take 1 tablet (5 mg total) by mouth daily. 30 tablet 1   No current facility-administered medications for this visit.    Allergies:   Lidocaine, Meloxicam, Quinolones, and Statins    Social History:  The patient  reports that he quit smoking about 23 years ago. His smoking use included cigarettes. He has never used smokeless tobacco. He reports current alcohol use. He reports that he does not use drugs.   Family History:  The patient's family history includes Diabetes in his sister; Hypertension in his mother; Prostate cancer in his father.    ROS:  Please see the history of present illness.   Otherwise, review of systems are positive for none.   All other systems are reviewed and negative.    PHYSICAL EXAM: VS:  BP 120/70 (BP Location: Left Arm, Patient Position: Sitting, Cuff Size: Normal)   Pulse 71   Ht 5' 7.5" (1.715 m)   Wt 151 lb 8 oz (68.7 kg)   SpO2 98%   BMI 23.38 kg/m  , BMI Body mass index is 23.38 kg/m. GEN: Well nourished, well developed, in no acute distress  HEENT: normal  Neck: no JVD, carotid bruits, or masses Cardiac: Irregularly irregular; no rubs, or gallops,no edema .  1/6 systolic murmur in the aortic area. Respiratory:  clear to auscultation bilaterally, normal work of breathing GI: soft, nontender, nondistended, + BS MS: no deformity or atrophy  Skin: warm and dry, no rash Neuro:  Strength and sensation are intact Psych: euthymic mood, full affect   EKG:  EKG is  ordered today. EKG showed atrial fibrillation with ventricular rate of 71  bpm.   Recent Labs: 03/18/2022: ALT 12; Magnesium 2.1; TSH 2.260 05/05/2022: BUN 25; Creatinine, Ser 1.13; Hemoglobin 12.6; Platelets 234; Potassium 4.1; Sodium 135    Lipid Panel    Component Value Date/Time   CHOL 118 03/18/2022 1105   CHOL 192 11/19/2016 0841   TRIG 133 03/18/2022 1105   HDL 31 (L) 03/18/2022 1105   HDL 36 (L) 11/19/2016 0841   CHOLHDL 3.8 03/18/2022 1105   VLDL 27 03/18/2022 1105   LDLCALC 60 03/18/2022 1105   LDLCALC 116 (H) 11/19/2016 0841   LDLDIRECT 70 03/18/2022 1105  Wt Readings from Last 3 Encounters:  07/13/22 151 lb 8 oz (68.7 kg)  05/04/22 149 lb 12.8 oz (67.9 kg)  03/18/22 151 lb 4 oz (68.6 kg)        ASSESSMENT AND PLAN:  1.  Coronary artery disease involving native coronary arteries without angina:  He is doing well with no anginal symptoms. Continue medical therapy .  No aspirin given that he is on anticoagulation.  2.  Permanent atrial fibrillation: Ventricular rate is controlled without medications.  He is tolerating anticoagulation with warfarin.  3. Abdominal aortic aneurysm: This was stable on most recent ultrasound at 3.3 cm.  I requested a follow-up study to be done in May 2024.  4. Hyperlipidemia: He is tolerating small dose rosuvastatin with Zetia and fenofibrate.  Most recent lipid profile in July showed an LDL of 60 and triglyceride of 133.  5. Essential hypertension: Blood pressure is well controlled on small dose lisinopril.  6. Mildly dilated aortic root at 4.5 cm.   This has been stable and followed by cardiothoracic surgery.   Disposition:   FU with me in 6 months  Signed,  Kathlyn Sacramento, MD  07/13/2022 4:52 PM    [facility]

## 2022-07-14 ENCOUNTER — Ambulatory Visit (INDEPENDENT_AMBULATORY_CARE_PROVIDER_SITE_OTHER): Payer: Medicare Other

## 2022-07-14 DIAGNOSIS — Z7901 Long term (current) use of anticoagulants: Secondary | ICD-10-CM | POA: Diagnosis not present

## 2022-07-14 DIAGNOSIS — I4819 Other persistent atrial fibrillation: Secondary | ICD-10-CM | POA: Diagnosis not present

## 2022-07-14 LAB — POCT INR: INR: 2.7 (ref 2.0–3.0)

## 2022-07-14 NOTE — Patient Instructions (Signed)
CONTINUE  0.5 tablet Daily, EXCEPT NONE ON MONDAY and FRIDAY.   INR in 5 weeks.  [phone]

## 2022-08-05 DIAGNOSIS — Z85828 Personal history of other malignant neoplasm of skin: Secondary | ICD-10-CM | POA: Diagnosis not present

## 2022-08-05 DIAGNOSIS — Z5189 Encounter for other specified aftercare: Secondary | ICD-10-CM | POA: Diagnosis not present

## 2022-08-11 DIAGNOSIS — H2512 Age-related nuclear cataract, left eye: Secondary | ICD-10-CM | POA: Diagnosis not present

## 2022-08-18 ENCOUNTER — Ambulatory Visit: Payer: Medicare Other | Attending: Cardiovascular Disease

## 2022-08-18 DIAGNOSIS — I4819 Other persistent atrial fibrillation: Secondary | ICD-10-CM | POA: Diagnosis not present

## 2022-08-18 DIAGNOSIS — Z5181 Encounter for therapeutic drug level monitoring: Secondary | ICD-10-CM

## 2022-08-18 DIAGNOSIS — Z7901 Long term (current) use of anticoagulants: Secondary | ICD-10-CM

## 2022-08-18 LAB — POCT INR: INR: 2.4 (ref 2.0–3.0)

## 2022-08-18 NOTE — Patient Instructions (Signed)
CONTINUE  0.5 tablet Daily, EXCEPT NONE ON MONDAY and FRIDAY.   INR in 6 weeks.  [phone]

## 2022-08-19 ENCOUNTER — Other Ambulatory Visit: Payer: Self-pay

## 2022-08-19 DIAGNOSIS — E785 Hyperlipidemia, unspecified: Secondary | ICD-10-CM

## 2022-08-19 MED ORDER — ROSUVASTATIN CALCIUM 5 MG PO TABS: 5.0000 mg | ORAL_TABLET | Freq: Every day | ORAL | 3 refills | Status: DC

## 2022-08-25 ENCOUNTER — Encounter: Payer: Self-pay | Admitting: Ophthalmology

## 2022-08-25 DIAGNOSIS — H353211 Exudative age-related macular degeneration, right eye, with active choroidal neovascularization: Secondary | ICD-10-CM | POA: Diagnosis not present

## 2022-09-06 NOTE — Discharge Instructions (Signed)

## 2022-09-07 ENCOUNTER — Ambulatory Visit
Admission: RE | Admit: 2022-09-07 | Discharge: 2022-09-07 | Disposition: A | Discharge status: Home / Self Care | Payer: Medicare Other | Attending: Ophthalmology | Admitting: Ophthalmology

## 2022-09-07 ENCOUNTER — Encounter
Admission: RE | Disposition: A | Discharge status: Home / Self Care | Payer: Self-pay | Source: Home / Self Care | Attending: Ophthalmology

## 2022-09-07 ENCOUNTER — Ambulatory Visit: Payer: Medicare Other | Admitting: General Practice

## 2022-09-07 ENCOUNTER — Encounter: Payer: Self-pay | Admitting: Ophthalmology

## 2022-09-07 ENCOUNTER — Other Ambulatory Visit: Payer: Self-pay

## 2022-09-07 DIAGNOSIS — I4891 Unspecified atrial fibrillation: Secondary | ICD-10-CM | POA: Insufficient documentation

## 2022-09-07 DIAGNOSIS — Z8249 Family history of ischemic heart disease and other diseases of the circulatory system: Secondary | ICD-10-CM | POA: Diagnosis not present

## 2022-09-07 DIAGNOSIS — I2510 Atherosclerotic heart disease of native coronary artery without angina pectoris: Secondary | ICD-10-CM | POA: Diagnosis not present

## 2022-09-07 DIAGNOSIS — Z833 Family history of diabetes mellitus: Secondary | ICD-10-CM | POA: Diagnosis not present

## 2022-09-07 DIAGNOSIS — N183 Chronic kidney disease, stage 3 unspecified: Secondary | ICD-10-CM | POA: Insufficient documentation

## 2022-09-07 DIAGNOSIS — I129 Hypertensive chronic kidney disease with stage 1 through stage 4 chronic kidney disease, or unspecified chronic kidney disease: Secondary | ICD-10-CM | POA: Insufficient documentation

## 2022-09-07 DIAGNOSIS — I252 Old myocardial infarction: Secondary | ICD-10-CM | POA: Insufficient documentation

## 2022-09-07 DIAGNOSIS — E1122 Type 2 diabetes mellitus with diabetic chronic kidney disease: Secondary | ICD-10-CM | POA: Insufficient documentation

## 2022-09-07 DIAGNOSIS — H2512 Age-related nuclear cataract, left eye: Secondary | ICD-10-CM | POA: Insufficient documentation

## 2022-09-07 DIAGNOSIS — E039 Hypothyroidism, unspecified: Secondary | ICD-10-CM | POA: Insufficient documentation

## 2022-09-07 DIAGNOSIS — Z7901 Long term (current) use of anticoagulants: Secondary | ICD-10-CM | POA: Diagnosis not present

## 2022-09-07 DIAGNOSIS — Z87891 Personal history of nicotine dependence: Secondary | ICD-10-CM | POA: Diagnosis not present

## 2022-09-07 HISTORY — PX: CATARACT EXTRACTION W/PHACO: SHX586

## 2022-09-07 SURGERY — PHACOEMULSIFICATION, CATARACT, WITH IOL INSERTION
Anesthesia: Monitor Anesthesia Care | Site: Eye | Laterality: Left

## 2022-09-07 MED ORDER — MIDAZOLAM HCL 2 MG/2ML IJ SOLN
INTRAMUSCULAR | Status: DC | PRN
  Administered 2022-09-07 (×2): .5 mg via INTRAVENOUS

## 2022-09-07 MED ORDER — SIGHTPATH DOSE#1 NA CHONDROIT SULF-NA HYALURON 40-17 MG/ML IO SOLN
INTRAOCULAR | Status: DC | PRN
  Administered 2022-09-07: 1 mL via INTRAOCULAR

## 2022-09-07 MED ORDER — SIGHTPATH DOSE#1 BSS IO SOLN
INTRAOCULAR | Status: DC | PRN
  Administered 2022-09-07: 61 mL via OPHTHALMIC

## 2022-09-07 MED ORDER — FENTANYL CITRATE (PF) 100 MCG/2ML IJ SOLN
INTRAMUSCULAR | Status: DC | PRN
  Administered 2022-09-07: 50 ug via INTRAVENOUS

## 2022-09-07 MED ORDER — SIGHTPATH DOSE#1 BSS IO SOLN
INTRAOCULAR | Status: DC | PRN
  Administered 2022-09-07: 15 mL via INTRAOCULAR

## 2022-09-07 MED ORDER — ARMC OPHTHALMIC DILATING DROPS
1.0000 | OPHTHALMIC | Status: DC | PRN
  Administered 2022-09-07 (×3): 1 via OPHTHALMIC

## 2022-09-07 MED ORDER — MOXIFLOXACIN HCL 0.5 % OP SOLN
OPHTHALMIC | Status: DC | PRN
  Administered 2022-09-07: .2 mL via OPHTHALMIC

## 2022-09-07 MED ORDER — TETRACAINE HCL 0.5 % OP SOLN
1.0000 [drp] | OPHTHALMIC | Status: DC | PRN
  Administered 2022-09-07 (×3): 1 [drp] via OPHTHALMIC

## 2022-09-07 MED ORDER — FENTANYL CITRATE PF 50 MCG/ML IJ SOSY: 25.0000 ug | PREFILLED_SYRINGE | INTRAMUSCULAR | Status: DC | PRN

## 2022-09-07 MED ORDER — SIGHTPATH DOSE#1 BSS IO SOLN
INTRAOCULAR | Status: DC | PRN
  Administered 2022-09-07: 2 mL

## 2022-09-07 MED ORDER — BRIMONIDINE TARTRATE-TIMOLOL 0.2-0.5 % OP SOLN
OPHTHALMIC | Status: DC | PRN
  Administered 2022-09-07: 1 [drp] via OPHTHALMIC

## 2022-09-07 MED ORDER — LACTATED RINGERS IV SOLN: INTRAVENOUS | Status: DC

## 2022-09-07 MED ORDER — ONDANSETRON HCL 4 MG/2ML IJ SOLN: 4.0000 mg | Freq: Once | INTRAMUSCULAR | Status: DC | PRN

## 2022-09-07 SURGICAL SUPPLY — 11 items
CANNULA ANT/CHMB 27G (MISCELLANEOUS) IMPLANT
CANNULA ANT/CHMB 27GA (MISCELLANEOUS) IMPLANT
CATARACT SUITE SIGHTPATH (MISCELLANEOUS) ×1 IMPLANT
FEE CATARACT SUITE SIGHTPATH (MISCELLANEOUS) ×1 IMPLANT
GLOVE SURG ENC TEXT LTX SZ8 (GLOVE) ×1 IMPLANT
GLOVE SURG TRIUMPH 8.0 PF LTX (GLOVE) ×1 IMPLANT
LENS IOL TECNIS EYHANCE 19.5 (Intraocular Lens) IMPLANT
NDL FILTER BLUNT 18X1 1/2 (NEEDLE) ×1 IMPLANT
NEEDLE FILTER BLUNT 18X1 1/2 (NEEDLE) ×1 IMPLANT
SYR 3ML LL SCALE MARK (SYRINGE) ×1 IMPLANT
WATER STERILE IRR 250ML POUR (IV SOLUTION) ×1 IMPLANT

## 2022-09-07 NOTE — Anesthesia Postprocedure Evaluation (Signed)
Anesthesia Post Note  Patient: Thomas Fuentes  Procedure(s) Performed: CATARACT EXTRACTION PHACO AND INTRAOCULAR LENS PLACEMENT (IOC) LEFT 9.87 00:56.1 (Left: Eye)  Patient location during evaluation: PACU Anesthesia Type: MAC Level of consciousness: awake and alert Pain management: pain level controlled Vital Signs Assessment: post-procedure vital signs reviewed and stable Respiratory status: spontaneous breathing, nonlabored ventilation, respiratory function stable and patient connected to nasal cannula oxygen Cardiovascular status: stable and blood pressure returned to baseline Postop Assessment: no apparent nausea or vomiting Anesthetic complications: no   No notable events documented.   Last Vitals:  Vitals:   09/07/22 1034 09/07/22 1039  BP: 114/76 110/77  Pulse: 74 73  Resp: 15 19  Temp: 36.5 C 36.5 C  SpO2: 95% 94%    Last Pain:  Vitals:   09/07/22 1039  TempSrc:   PainSc: 0-No pain                 Molli Barrows

## 2022-09-07 NOTE — Op Note (Signed)
PREOPERATIVE DIAGNOSIS:  Nuclear sclerotic cataract of the left eye.   POSTOPERATIVE DIAGNOSIS:  Nuclear sclerotic cataract of the left eye.   OPERATIVE PROCEDURE:ORPROCALL@   SURGEON:  Birder Robson, MD.   ANESTHESIA:  Anesthesiologist: Molli Barrows, MD CRNA: Aline Brochure, CRNA; Gigi Gin, CRNA  1.      Managed anesthesia care. 2.     0.36m of Shugarcaine was instilled following the paracentesis   COMPLICATIONS:  None.   TECHNIQUE:   Stop and chop   DESCRIPTION OF PROCEDURE:  The patient was examined and consented in the preoperative holding area where the aforementioned topical anesthesia was applied to the left eye and then brought back to the Operating Room where the left eye was prepped and draped in the usual sterile ophthalmic fashion and a lid speculum was placed. A paracentesis was created with the side port blade and the anterior chamber was filled with viscoelastic. A near clear corneal incision was performed with the steel keratome. A continuous curvilinear capsulorrhexis was performed with a cystotome followed by the capsulorrhexis forceps. Hydrodissection and hydrodelineation were carried out with BSS on a blunt cannula. The lens was removed in a stop and chop  technique and the remaining cortical material was removed with the irrigation-aspiration handpiece. The capsular bag was inflated with viscoelastic and the Technis ZCB00 lens was placed in the capsular bag without complication. The remaining viscoelastic was removed from the eye with the irrigation-aspiration handpiece. The wounds were hydrated. The anterior chamber was flushed with BSS and the eye was inflated to physiologic pressure. 0.18mVigamox was placed in the anterior chamber. The wounds were found to be water tight. The eye was dressed with Combigan. The patient was given protective glasses to wear throughout the day and a shield with which to sleep tonight. The patient was also given drops with which  to begin a drop regimen today and will follow-up with me in one day. Implant Name Type Inv. Item Serial No. Manufacturer Lot No. LRB No. Used Action  LENS IOL TECNIS EYHANCE 19.5 - [id number] Lens LENS IOL TECNIS EYHANCE 19.5 [id number]  Left 1 Implanted    Procedure(s): CATARACT EXTRACTION PHACO AND INTRAOCULAR LENS PLACEMENT (IOC) LEFT 9.87 00:56.1 (Left)  Electronically signed: WiBirder Robson/05/2023 10:32 AM

## 2022-09-07 NOTE — H&P (Signed)
[facility]   Primary Care Physician:  Ginger Organ., MD Ophthalmologist: Dr. George Ina  Pre-Procedure History & Physical: HPI:  Thomas Fuentes is a 82 y.o. male here for cataract surgery.   Past Medical History:  Diagnosis Date   AAA (abdominal aortic aneurysm) ([place])    a. 05/2019 U/S: 3.1cm AAA; b. 12/2021 Abd Ao U/S: 3.3 cm AAA.   Ascending aortic aneurysm ([place])    a. 10/2019 CTA Chest: 4.3cm asc TAA; b. 10/2020 CTA Chest: 4.2cm asc TAA; c. 10/2021 CTA Chest: 4.4cm TAA.   Chronic renal disease, stage III (HCC)    Coronary artery disease    a. MI 2000 s/p PCI and 2 stent placement to unknown arteries;  b. LHC 08/09/2016 o-pLAD 30%, mLAD 99%, OM2 50%, OM3 85%, pRCA 99%, mid RCA 60%, RPDA 60%, LVEDP mod elevated. CABG in 07/2016 :  LIMA to LAD, SVG to RCA , SVG to LCX; c. 10/2019 MV: No ischemia or scar. Low risk.   History of echocardiogram    a. 02/2022 Echo: EF 50-55%, no rwma, nl RV fxn, mod dil LA, mild MR. Ao sclerosis. Ao root 59m. Asc Ao 356m   Hyperlipidemia    Hypertension    Hypothyroidism    Macular degeneration    bilateral   Metabolic syndrome    [place]   Peripheral neuropathy    [place] vascular [place]    Past Surgical History:  Procedure Laterality Date   CARDIAC CATHETERIZATION  2000   [facility] x2 stents   CARDIAC CATHETERIZATION N/A 08/09/2016   Procedure: Left Heart Cath and Coronary Angiography;  Surgeon: MuWellington HampshireMD;  [place];  Service: Cardiovascular;  Laterality: N/A;   CORONARY ARTERY BYPASS GRAFT N/A 08/11/2016   Procedure: CORONARY ARTERY BYPASS GRAFTING on pump using left internal mammary artery to left anterior descending coronary artery , portion of right greater saphenous vein to right coronary artery, portion of right greater saphenous vein graft to distal circumflex.;  Surgeon: EdGrace IsaacMD;  [place] [facility] Service: Open [facility] Surgery;  Laterality: N/A;    CORONARY STENT PLACEMENT     ENDOVEIN HARVEST OF GREATER SAPHENOUS VEIN Right 08/11/2016   Procedure: ENDOVEIN HARVEST OF GREATER SAPHENOUS VEIN;  Surgeon: EdGrace IsaacMD;  [place] [facility] [place] Service: [place];  Laterality: Right;   TEE WITHOUT CARDIOVERSION N/A 08/11/2016   Procedure: TRANSESOPHAGEAL ECHOCARDIOGRAM (TEE);  Surgeon: EdGrace IsaacMD;  [place]: [place];  Laterality: N/A;    Prior to Admission medications   Medication Sig Start Date End Date Taking? Authorizing Provider  Cholecalciferol (VITAMIN D3 PO) Take 6,000 Units by mouth. Taking 1 capsule daily   Yes [provider]  ezetimibe (ZETIA) 10 MG tablet Take 1 tablet (10 mg total) by mouth daily. 08/16/16  Yes Gold, Wayne E, PA-C  fenofibrate 160 MG tablet Take 160 mg by mouth.  02/02/17  Yes [provider]  gabapentin (NEURONTIN) 600 MG tablet Take 600 mg by mouth 3 (three) times daily.   Yes [provider]  levothyroxine (SYNTHROID) 50 MCG tablet Take 50 mcg by mouth daily. 10/24/19  Yes [provider]  lisinopril (ZESTRIL) 5 MG tablet Take 1 tablet by mouth once daily 06/25/22  Yes Arida, MuMertie ClauseMD  Multiple Vitamins-Minerals (ICAPS AREDS 2 PO) Take by mouth 2 times daily at 12 noon and 4 pm.  Yes [provider]  Multiple Vitamins-Minerals (ZINC PO) Take by mouth. Taking 2 capsules daily   Yes [provider]  rosuvastatin (CRESTOR) 5 MG tablet Take 1 tablet (5 mg total) by mouth daily. 08/19/22  Yes Wellington Hampshire, MD  sildenafil (REVATIO) 20 MG tablet Take 60-80 mg by mouth as directed. 08/10/19  Yes [provider]  VITAMIN E PO Take 1 tablet by mouth daily.   Yes [provider]  warfarin (COUMADIN) 5 MG tablet Take 1 tablet (5 mg total) by mouth daily. 05/04/22  Yes Theora Gianotti, NP    Allergies as of 06/11/2022 - Review Complete 05/04/2022  Allergen Reaction Noted   Lidocaine  Anaphylaxis 07/15/2013   Meloxicam Other (See Comments) 11/19/2010   Quinolones  12/05/2018   Statins Nausea And Vomiting 11/19/2010    Family History  Problem Relation Age of Onset   Hypertension Mother    Prostate cancer Father    Diabetes Sister     Social History   Socioeconomic History   Marital status: Widowed    Spouse name: Not on file   Number of children: Not on file   Years of education: Not on file   Highest education level: Not on file  Occupational History   Not on file  Tobacco Use   Smoking status: Former    Years: 40.00    Types: Cigarettes    Quit date: 02/06/1999    Years since quitting: 23.6   Smokeless tobacco: Never  Substance and Sexual Activity   Alcohol use: Yes    Comment: rare   Drug use: No   Sexual activity: Not on file  Other Topics Concern   Not on file  Social History Narrative   Not on file   Social Determinants of Health   Financial Resource Strain: Not on file  Food Insecurity: Not on file  Transportation Needs: Not on file  Physical Activity: Not on file  Stress: Not on file  Social Connections: Not on file  Intimate Partner Violence: Not on file    Review of Systems: See HPI, otherwise negative ROS  Physical Exam: BP (!) 155/104   Pulse 75   Temp (!) 97.2 F (36.2 C) (Temporal)   Resp 18   Ht 5' 7.5" (1.715 m)   Wt 69.4 kg   SpO2 97%   BMI 23.61 kg/m  General:   Alert, cooperative in NAD Head:  Normocephalic and atraumatic. Respiratory:  Normal work of breathing. Cardiovascular:  RRR  Impression/Plan: Thomas Fuentes is here for cataract surgery.  Risks, benefits, limitations, and alternatives regarding cataract surgery have been reviewed with the patient.  Questions have been answered.  All parties agreeable.   Birder Robson, MD  09/07/2022, 10:06 AM

## 2022-09-07 NOTE — Anesthesia Preprocedure Evaluation (Signed)
Anesthesia Evaluation  Patient identified by MRN, date of birth, ID band Patient awake    Reviewed: Allergy & Precautions, H&P , NPO status , Patient's Chart, lab work & pertinent test results, reviewed documented beta blocker date and time   Airway Mallampati: II  TM Distance: >3 FB Neck ROM: full    Dental no notable dental hx. (+) Teeth Intact   Pulmonary neg pulmonary ROS, former smoker   Pulmonary exam normal breath sounds clear to auscultation       Cardiovascular Exercise Tolerance: Poor hypertension, On Medications + CAD and + Past MI  Atrial Fibrillation  Rhythm:regular Rate:Normal     Neuro/Psych  Neuromuscular disease  negative psych ROS   GI/Hepatic negative GI ROS, Neg liver ROS,,,  Endo/Other  diabetes, Well ControlledHypothyroidism    Renal/GU Renal disease     Musculoskeletal   Abdominal   Peds  Hematology negative hematology ROS (+)   Anesthesia Other Findings   Reproductive/Obstetrics negative OB ROS                             Anesthesia Physical Anesthesia Plan  ASA: 3  Anesthesia Plan: MAC   Post-op Pain Management:    Induction:   PONV Risk Score and Plan: 1  Airway Management Planned:   Additional Equipment:   Intra-op Plan:   Post-operative Plan:   Informed Consent: I have reviewed the patients History and Physical, chart, labs and discussed the procedure including the risks, benefits and alternatives for the proposed anesthesia with the patient or authorized representative who has indicated his/her understanding and acceptance.       Plan Discussed with: CRNA  Anesthesia Plan Comments:        Anesthesia Quick Evaluation

## 2022-09-07 NOTE — Transfer of Care (Signed)
Immediate Anesthesia Transfer of Care Note  Patient: Thomas Fuentes Dial  Procedure(s) Performed: CATARACT EXTRACTION PHACO AND INTRAOCULAR LENS PLACEMENT (IOC) LEFT 9.87 00:56.1 (Left: Eye)  Patient Location: PACU  Anesthesia Type:MAC  Level of Consciousness: awake, alert , and oriented  Airway & Oxygen Therapy: Patient Spontanous Breathing  Post-op Assessment: Report given to RN and Post -op Vital signs reviewed and stable  Post vital signs: Reviewed and stable  Last Vitals:  Vitals Value Taken Time  BP 114/76 09/07/22 1034  Temp 36.5 C 09/07/22 1034  Pulse 69 09/07/22 1035  Resp 19 09/07/22 1035  SpO2 94 % 09/07/22 1035  Vitals shown include unvalidated device data.  Last Pain:  Vitals:   09/07/22 1034  TempSrc:   PainSc: 0-No pain         Complications: No notable events documented.

## 2022-09-08 ENCOUNTER — Encounter: Payer: Self-pay | Admitting: Ophthalmology

## 2022-09-09 ENCOUNTER — Ambulatory Visit: Payer: PRIVATE HEALTH INSURANCE | Admitting: Cardiovascular Disease

## 2022-09-13 DIAGNOSIS — H2511 Age-related nuclear cataract, right eye: Secondary | ICD-10-CM | POA: Diagnosis not present

## 2022-09-20 NOTE — Discharge Instructions (Signed)
   Cataract Surgery, Care After ? ?This sheet gives you information about how to care for yourself after your surgery.  Your ophthalmologist may also give you more specific instructions.  If you have problems or questions, contact your doctor at [facility], [phone]. ? ?What can I expect after the surgery? ?It is common to have: ?Itching ?Foreign body sensation (feels like a grain of sand in the eye) ?Watery discharge (excess tearing) ?Sensitivity to light and touch ?Bruising in or around the eye ?Mild blurred vision ? ?Follow these instructions at home: ?Do not touch or rub your eyes. ?You may be told to wear a protective shield or sunglasses to protect your eyes. ?Do not put a contact lens in the operative eye unless your doctor approves. ?Keep the lids and face clean and dry. ?Do not allow water to hit you directly in the face while showering. ?Keep soap and shampoo out of your eyes. ?Do not use eye makeup for 1 week. ? ?Check your eye every day for signs of infection.  Watch for: ?Redness, swelling, or pain. ?Fluid, blood or pus. ?Worsening vision. ?Worsening sensitivity to light or touch. ? ?Activity: ?During the first day, avoid bending over and reading.  You may resume reading and bending the next day. ?Do not drive or use heavy machinery for at least 24 hours. ?Avoid strenuous activities for 1 week.  Activities such as walking, treadmill, exercise bike, and climbing stairs are okay. ?Do not lift heavy (>20 pound) objects for 1 week. ?Do not do yardwork, gardening, or dirty housework (mopping, cleaning bathrooms, vacuuming, etc.) for 1 week. ?Do not swim or use a hot tub for 2 weeks. ?Ask your doctor when you can return to work. ? ?General Instructions: ?Take or apply prescription and over-the-counter medicines as directed by your doctor, including eyedrops and ointments. ?Resume medications discontinued prior to surgery, unless told otherwise by your doctor. ?Keep all follow up appointments as  scheduled. ? ?Contact a health care provider if: ?You have increased bruising around your eye. ?You have pain that is not helped with medication. ?You have a fever. ?You have fluid, pus, or blood coming from your eye or incision. ?Your sensitivity to light gets worse. ?You have spots (floaters) of flashing lights in your vision. ?You have nausea or vomiting. ? ?Go to the nearest emergency room or call 911 if: ?You have sudden loss of vision. ?You have severe, worsening eye pain. ? ?

## 2022-09-21 ENCOUNTER — Other Ambulatory Visit: Payer: Self-pay

## 2022-09-21 ENCOUNTER — Encounter: Payer: Self-pay | Admitting: Ophthalmology

## 2022-09-21 ENCOUNTER — Ambulatory Visit: Payer: Medicare Other | Admitting: Anesthesiology

## 2022-09-21 ENCOUNTER — Ambulatory Visit
Admission: RE | Admit: 2022-09-21 | Discharge: 2022-09-21 | Disposition: A | Discharge status: Home / Self Care | Payer: Medicare Other | Attending: Ophthalmology | Admitting: Ophthalmology

## 2022-09-21 ENCOUNTER — Encounter
Admission: RE | Disposition: A | Discharge status: Home / Self Care | Payer: Self-pay | Source: Home / Self Care | Attending: Ophthalmology

## 2022-09-21 DIAGNOSIS — I129 Hypertensive chronic kidney disease with stage 1 through stage 4 chronic kidney disease, or unspecified chronic kidney disease: Secondary | ICD-10-CM | POA: Insufficient documentation

## 2022-09-21 DIAGNOSIS — I10 Essential (primary) hypertension: Secondary | ICD-10-CM | POA: Insufficient documentation

## 2022-09-21 DIAGNOSIS — Z951 Presence of aortocoronary bypass graft: Secondary | ICD-10-CM | POA: Insufficient documentation

## 2022-09-21 DIAGNOSIS — T7840XA Allergy, unspecified, initial encounter: Secondary | ICD-10-CM | POA: Diagnosis not present

## 2022-09-21 DIAGNOSIS — N183 Chronic kidney disease, stage 3 unspecified: Secondary | ICD-10-CM | POA: Diagnosis not present

## 2022-09-21 DIAGNOSIS — Z7901 Long term (current) use of anticoagulants: Secondary | ICD-10-CM | POA: Diagnosis not present

## 2022-09-21 DIAGNOSIS — I252 Old myocardial infarction: Secondary | ICD-10-CM | POA: Insufficient documentation

## 2022-09-21 DIAGNOSIS — H3530 Unspecified macular degeneration: Secondary | ICD-10-CM | POA: Insufficient documentation

## 2022-09-21 DIAGNOSIS — E119 Type 2 diabetes mellitus without complications: Secondary | ICD-10-CM

## 2022-09-21 DIAGNOSIS — Z955 Presence of coronary angioplasty implant and graft: Secondary | ICD-10-CM | POA: Insufficient documentation

## 2022-09-21 DIAGNOSIS — I4891 Unspecified atrial fibrillation: Secondary | ICD-10-CM | POA: Diagnosis not present

## 2022-09-21 DIAGNOSIS — I2510 Atherosclerotic heart disease of native coronary artery without angina pectoris: Secondary | ICD-10-CM | POA: Diagnosis not present

## 2022-09-21 DIAGNOSIS — I739 Peripheral vascular disease, unspecified: Secondary | ICD-10-CM | POA: Insufficient documentation

## 2022-09-21 DIAGNOSIS — Z87891 Personal history of nicotine dependence: Secondary | ICD-10-CM | POA: Diagnosis not present

## 2022-09-21 DIAGNOSIS — H2511 Age-related nuclear cataract, right eye: Secondary | ICD-10-CM | POA: Insufficient documentation

## 2022-09-21 HISTORY — PX: CATARACT EXTRACTION W/PHACO: SHX586

## 2022-09-21 SURGERY — PHACOEMULSIFICATION, CATARACT, WITH IOL INSERTION
Anesthesia: Monitor Anesthesia Care | Site: Eye | Laterality: Right

## 2022-09-21 MED ORDER — FENTANYL CITRATE (PF) 100 MCG/2ML IJ SOLN
INTRAMUSCULAR | Status: DC | PRN
  Administered 2022-09-21: 12.5 ug via INTRAVENOUS

## 2022-09-21 MED ORDER — SIGHTPATH DOSE#1 BSS IO SOLN
INTRAOCULAR | Status: DC | PRN
  Administered 2022-09-21: 2 mL

## 2022-09-21 MED ORDER — MOXIFLOXACIN HCL 0.5 % OP SOLN
OPHTHALMIC | Status: DC | PRN
  Administered 2022-09-21: .2 mL via OPHTHALMIC

## 2022-09-21 MED ORDER — SIGHTPATH DOSE#1 BSS IO SOLN
INTRAOCULAR | Status: DC | PRN
  Administered 2022-09-21: 58 mL via OPHTHALMIC

## 2022-09-21 MED ORDER — ONDANSETRON HCL 4 MG/2ML IJ SOLN: 4.0000 mg | Freq: Once | INTRAMUSCULAR | Status: DC | PRN

## 2022-09-21 MED ORDER — SIGHTPATH DOSE#1 NA CHONDROIT SULF-NA HYALURON 40-17 MG/ML IO SOLN
INTRAOCULAR | Status: DC | PRN
  Administered 2022-09-21: 1 mL via INTRAOCULAR

## 2022-09-21 MED ORDER — BRIMONIDINE TARTRATE-TIMOLOL 0.2-0.5 % OP SOLN
OPHTHALMIC | Status: DC | PRN
  Administered 2022-09-21: 1 [drp] via OPHTHALMIC

## 2022-09-21 MED ORDER — ACETAMINOPHEN 160 MG/5ML PO SOLN: 325.0000 mg | ORAL | Status: DC | PRN

## 2022-09-21 MED ORDER — ARMC OPHTHALMIC DILATING DROPS
1.0000 | OPHTHALMIC | Status: DC | PRN
  Administered 2022-09-21 (×3): 1 via OPHTHALMIC

## 2022-09-21 MED ORDER — SIGHTPATH DOSE#1 BSS IO SOLN
INTRAOCULAR | Status: DC | PRN
  Administered 2022-09-21: 15 mL via INTRAOCULAR

## 2022-09-21 MED ORDER — MIDAZOLAM HCL 2 MG/2ML IJ SOLN
INTRAMUSCULAR | Status: DC | PRN
  Administered 2022-09-21: 1 mg via INTRAVENOUS

## 2022-09-21 MED ORDER — TETRACAINE HCL 0.5 % OP SOLN
1.0000 [drp] | OPHTHALMIC | Status: DC | PRN
  Administered 2022-09-21 (×3): 1 [drp] via OPHTHALMIC

## 2022-09-21 MED ORDER — LACTATED RINGERS IV SOLN: INTRAVENOUS | Status: DC

## 2022-09-21 MED ORDER — ACETAMINOPHEN 325 MG PO TABS: 650.0000 mg | ORAL_TABLET | Freq: Once | ORAL | Status: DC | PRN

## 2022-09-21 SURGICAL SUPPLY — 15 items
CANNULA ANT/CHMB 27G (MISCELLANEOUS) IMPLANT
CANNULA ANT/CHMB 27GA (MISCELLANEOUS) IMPLANT
CATARACT SUITE SIGHTPATH (MISCELLANEOUS) ×1 IMPLANT
FEE CATARACT SUITE SIGHTPATH (MISCELLANEOUS) ×1 IMPLANT
GLOVE BIOGEL PI IND STRL 8 (GLOVE) ×1 IMPLANT
GLOVE SURG ENC TEXT LTX SZ8 (GLOVE) ×1 IMPLANT
LENS IOL TECNIS EYHANCE 20.0 (Intraocular Lens) IMPLANT
NDL FILTER BLUNT 18X1 1/2 (NEEDLE) ×1 IMPLANT
NEEDLE FILTER BLUNT 18X1 1/2 (NEEDLE) ×1 IMPLANT
PACK VIT ANT 23G (MISCELLANEOUS) IMPLANT
RING MALYGIN (MISCELLANEOUS) IMPLANT
SUT ETHILON 10-0 CS-B-6CS-B-6 (SUTURE)
SUTURE EHLN 10-0 CS-B-6CS-B-6 (SUTURE) IMPLANT
SYR 3ML LL SCALE MARK (SYRINGE) ×1 IMPLANT
WATER STERILE IRR 250ML POUR (IV SOLUTION) ×1 IMPLANT

## 2022-09-21 NOTE — H&P (Signed)
[facility]   Primary Care Physician:  Ginger Organ., MD Ophthalmologist: Dr.Poriflio  Pre-Procedure History & Physical: HPI:  Thomas Fuentes is a 82 y.o. male here for cataract surgery.   Past Medical History:  Diagnosis Date   AAA (abdominal aortic aneurysm) ([place])    a. 05/2019 U/S: 3.1cm AAA; b. 12/2021 Abd Ao U/S: 3.3 cm AAA.   Ascending aortic aneurysm ([place])    a. 10/2019 CTA Chest: 4.3cm asc TAA; b. 10/2020 CTA Chest: 4.2cm asc TAA; c. 10/2021 CTA Chest: 4.4cm TAA.   Chronic renal disease, stage III (HCC)    Coronary artery disease    a. MI 2000 s/p PCI and 2 stent placement to unknown arteries;  b. LHC 08/09/2016 o-pLAD 30%, mLAD 99%, OM2 50%, OM3 85%, pRCA 99%, mid RCA 60%, RPDA 60%, LVEDP mod elevated. CABG in 07/2016 :  LIMA to LAD, SVG to RCA , SVG to LCX; c. 10/2019 MV: No ischemia or scar. Low risk.   History of echocardiogram    a. 02/2022 Echo: EF 50-55%, no rwma, nl RV fxn, mod dil LA, mild MR. Ao sclerosis. Ao root 54m. Asc Ao 310m   Hyperlipidemia    Hypertension    Hypothyroidism    Macular degeneration    bilateral   Metabolic syndrome    [place]) [place]   Peripheral neuropathy    [place]ipheral vascular disease ([place]    Past Surgical History:  Procedure Laterality Date   CARDIAC CATHETERIZATION  2000   [facility] x2 stents   CARDIAC CATHETERIZATION N/A 08/09/2016   Procedure: Left Heart Cath and Coronary Angiography;  Surgeon: MuWellington HampshireMD;  [facility];  Service: Cardiovascular;  Laterality: N/A;   CATARACT EXTRACTION W/PHACO Left 09/07/2022   Procedure: CATARACT EXTRACTION PHACO AND INTRAOCULAR LENS PLACEMENT (IOC) LEFT 9.87 00:56.1;  Surgeon: PoBirder RobsonMD;  [place] Service: Ophthalmology;  Laterality: Left;   CORONARY ARTERY BYPASS GRAFT N/A 08/11/2016   Procedure: CORONARY ARTERY BYPASS GRAFTING on pump using left internal mammary artery to left anterior descending coronary  artery , portion of right greater saphenous vein to right coronary artery, portion of right greater saphenous vein graft to distal circumflex.;  Surgeon: EdGrace IsaacMD;  [place] [facility] [facility]: Open Heart Surgery;  Laterality: N/A;   CORONARY STENT PLACEMENT     ENDOVEIN HARVEST OF GREATER SAPHENOUS VEIN Right 08/11/2016   Procedure: ENDOVEIN HARVEST OF GREATER SAPHENOUS VEIN;  Surgeon: EdGrace IsaacMD;  [place] [facility]: Open [facility] Surgery;  Laterality: Right;   TEE WITHOUT CARDIOVERSION N/A 08/11/2016   Procedure: TRANSESOPHAGEAL ECHOCARDIOGRAM (TEE);  Surgeon: EdGrace IsaacMD;  [place] [facility] [facility]: Open [facility] Surgery;  Laterality: N/A;    Prior to Admission medications   Medication Sig Start Date End Date Taking? Authorizing Provider  Cholecalciferol (VITAMIN D3 PO) Take 6,000 Units by mouth. Taking 1 capsule daily   Yes [provider]  ezetimibe (ZETIA) 10 MG tablet Take 1 tablet (10 mg total) by mouth daily. 08/16/16  Yes Gold, Wayne E, PA-C  fenofibrate 160 MG tablet Take 160 mg by mouth.  02/02/17  Yes [provider]  gabapentin (NEURONTIN) 600 MG tablet Take 600 mg by mouth 3 (three) times daily.   Yes [provider]  levothyroxine (SYNTHROID) 50 MCG tablet Take 50 mcg by mouth daily. 10/24/19  Yes [provider]  lisinopril (ZESTRIL) 5 MG  tablet Take 1 tablet by mouth once daily 06/25/22  Yes Arida, Mertie Clause, MD  Multiple Vitamins-Minerals (ICAPS AREDS 2 PO) Take by mouth 2 times daily at 12 noon and 4 pm.   Yes [provider]  Multiple Vitamins-Minerals (ZINC PO) Take by mouth. Taking 2 capsules daily   Yes [provider]  rosuvastatin (CRESTOR) 5 MG tablet Take 1 tablet (5 mg total) by mouth daily. 08/19/22  Yes Wellington Hampshire, MD  VITAMIN E PO Take 1 tablet by mouth daily.   Yes [provider]  warfarin (COUMADIN) 5 MG tablet Take 1 tablet (5 mg total) by mouth daily. 05/04/22   Yes Theora Gianotti, NP  sildenafil (REVATIO) 20 MG tablet Take 60-80 mg by mouth as directed. 08/10/19   [provider]    Allergies as of 06/11/2022 - Review Complete 05/04/2022  Allergen Reaction Noted   Lidocaine Anaphylaxis 07/15/2013   Meloxicam Other (See Comments) 11/19/2010   Quinolones  12/05/2018   Statins Nausea And Vomiting 11/19/2010    Family History  Problem Relation Age of Onset   Hypertension Mother    Prostate cancer Father    Diabetes Sister     Social History   Socioeconomic History   Marital status: Widowed    Spouse name: Not on file   Number of children: Not on file   Years of education: Not on file   Highest education level: Not on file  Occupational History   Not on file  Tobacco Use   Smoking status: Former    Years: 40.00    Types: Cigarettes    Quit date: 02/06/1999    Years since quitting: 23.6   Smokeless tobacco: Never  Substance and Sexual Activity   Alcohol use: Yes    Comment: rare   Drug use: No   Sexual activity: Not on file  Other Topics Concern   Not on file  Social History Narrative   Not on file   Social Determinants of Health   Financial Resource Strain: Not on file  Food Insecurity: Not on file  Transportation Needs: Not on file  Physical Activity: Not on file  Stress: Not on file  Social Connections: Not on file  Intimate Partner Violence: Not on file    Review of Systems: See HPI, otherwise negative ROS  Physical Exam: BP (!) 129/103   Pulse 93   Temp 97.6 F (36.4 C) (Temporal)   Ht 5' 7.5" (1.715 m)   Wt 69.4 kg   SpO2 96%   BMI 23.61 kg/m  General:   Alert, cooperative in NAD Head:  Normocephalic and atraumatic. Respiratory:  Normal work of breathing. Cardiovascular:  RRR  Impression/Plan: Thomas Fuentes is here for cataract surgery.  Risks, benefits, limitations, and alternatives regarding cataract surgery have been reviewed with the patient.  Questions have been answered.   All parties agreeable.   Birder Robson, MD  09/21/2022, 9:41 AM

## 2022-09-21 NOTE — Anesthesia Postprocedure Evaluation (Signed)
Anesthesia Post Note  Patient: Thomas Fuentes  Procedure(s) Performed: CATARACT EXTRACTION PHACO AND INTRAOCULAR LENS PLACEMENT (IOC) RIGHT 7.31 00:48.6 (Right: Eye)  Patient location during evaluation: PACU Anesthesia Type: MAC Level of consciousness: awake and alert, oriented and patient cooperative Pain management: pain level controlled Vital Signs Assessment: post-procedure vital signs reviewed and stable Respiratory status: spontaneous breathing, nonlabored ventilation and respiratory function stable Cardiovascular status: blood pressure returned to baseline and stable Postop Assessment: adequate PO intake Anesthetic complications: no   No notable events documented.   Last Vitals:  Vitals:   09/21/22 1006 09/21/22 1011  BP: 100/84 118/71  Pulse: 76 75  Resp: 17 19  Temp: (!) 36.3 C (!) 36.3 C  SpO2: 96% 95%    Last Pain:  Vitals:   09/21/22 1011  TempSrc:   PainSc: 0-No pain                 Darrin Nipper

## 2022-09-21 NOTE — Op Note (Signed)
PREOPERATIVE DIAGNOSIS:  Nuclear sclerotic cataract of the right eye.   POSTOPERATIVE DIAGNOSIS:  Cataract   OPERATIVE PROCEDURE:ORPROCALL@   SURGEON:  Birder Robson, MD.   ANESTHESIA:  Anesthesiologist: Darrin Nipper, MD  1.      Managed anesthesia care. 2.      0.67m of Shugarcaine was instilled in the eye following the paracentesis.   COMPLICATIONS:  None.   TECHNIQUE:   Stop and chop   DESCRIPTION OF PROCEDURE:  The patient was examined and consented in the preoperative holding area where the aforementioned topical anesthesia was applied to the right eye and then brought back to the Operating Room where the right eye was prepped and draped in the usual sterile ophthalmic fashion and a lid speculum was placed. A paracentesis was created with the side port blade and the anterior chamber was filled with viscoelastic. A near clear corneal incision was performed with the steel keratome. A continuous curvilinear capsulorrhexis was performed with a cystotome followed by the capsulorrhexis forceps. Hydrodissection and hydrodelineation were carried out with BSS on a blunt cannula. The lens was removed in a stop and chop  technique and the remaining cortical material was removed with the irrigation-aspiration handpiece. The capsular bag was inflated with viscoelastic and the Technis ZCB00  lens was placed in the capsular bag without complication. The remaining viscoelastic was removed from the eye with the irrigation-aspiration handpiece. The wounds were hydrated. The anterior chamber was flushed with BSS and the eye was inflated to physiologic pressure. 0.182mof Vigamox was placed in the anterior chamber. The wounds were found to be water tight. The eye was dressed with Combigan. The patient was given protective glasses to wear throughout the day and a shield with which to sleep tonight. The patient was also given drops with which to begin a drop regimen today and will follow-up with me in one  day. Implant Name Type Inv. Item Serial No. Manufacturer Lot No. LRB No. Used Action  LENS IOL TECNIS EYHANCE 20.0 - [id number] Lens LENS IOL TECNIS EYHANCE 20.0 [id number]  Right 1 Implanted   Procedure(s): CATARACT EXTRACTION PHACO AND INTRAOCULAR LENS PLACEMENT (IOC) RIGHT 7.31 00:48.6 (Right)  Electronically signed: WiBirder Robson/23/2024 10:05 AM

## 2022-09-21 NOTE — Transfer of Care (Signed)
Immediate Anesthesia Transfer of Care Note  Patient: Thomas Fuentes  Procedure(s) Performed: CATARACT EXTRACTION PHACO AND INTRAOCULAR LENS PLACEMENT (IOC) RIGHT 7.31 00:48.6 (Right: Eye)  Patient Location: PACU  Anesthesia Type:MAC  Level of Consciousness: awake and alert   Airway & Oxygen Therapy: Stable on RA  Post-op Assessment: Report given to RN  Post vital signs: Reviewed  Last Vitals:  Vitals Value Taken Time  BP 100/84 09/21/22 1006  Temp 36.3 C 09/21/22 1006  Pulse 56 09/21/22 1007  Resp 15 09/21/22 1007  SpO2 95 % 09/21/22 1007  Vitals shown include unvalidated device data.  Last Pain:  Vitals:   09/21/22 1006  TempSrc:   PainSc: 0-No pain         Complications: No notable events documented.

## 2022-09-21 NOTE — Anesthesia Preprocedure Evaluation (Addendum)
Anesthesia Evaluation  Patient identified by MRN, date of birth, ID band Patient awake    Reviewed: Allergy & Precautions, NPO status , Patient's Chart, lab work & pertinent test results  History of Anesthesia Complications Negative for: history of anesthetic complications  Airway Mallampati: III   Neck ROM: Full    Dental  (+) Missing   Pulmonary former smoker (quit 2000)   Pulmonary exam normal breath sounds clear to auscultation       Cardiovascular hypertension, + CAD (s/p MI, CABG, stents) and + Peripheral Vascular Disease (TAA 4.4 cm; AAA 3.3 cm)  Normal cardiovascular exam+ dysrhythmias (a fib on warfarin)  Rhythm:Regular Rate:Normal     Neuro/Psych  Neuromuscular disease (peripheral neuropathy)    GI/Hepatic negative GI ROS,,,  Endo/Other  diabetes, Type 2Hypothyroidism    Renal/GU Renal disease (stage III CKD)     Musculoskeletal   Abdominal   Peds  Hematology negative hematology ROS (+)   Anesthesia Other Findings   Reproductive/Obstetrics                             Anesthesia Physical Anesthesia Plan  ASA: 3  Anesthesia Plan: MAC   Post-op Pain Management:    Induction: Intravenous  PONV Risk Score and Plan: 1 and Treatment may vary due to age or medical condition, Midazolam and TIVA  Airway Management Planned: Natural Airway and Nasal Cannula  Additional Equipment:   Intra-op Plan:   Post-operative Plan:   Informed Consent: I have reviewed the patients History and Physical, chart, labs and discussed the procedure including the risks, benefits and alternatives for the proposed anesthesia with the patient or authorized representative who has indicated his/her understanding and acceptance.     Dental advisory given  Plan Discussed with: CRNA  Anesthesia Plan Comments: (LMA/GETA backup discussed.  Patient consented for risks of anesthesia including but not  limited to:  - adverse reactions to medications - damage to eyes, teeth, lips or other oral mucosa - nerve damage due to positioning  - sore throat or hoarseness - damage to heart, brain, nerves, lungs, other parts of body or loss of life  Informed patient about role of CRNA in peri- and intra-operative care.  Patient voiced understanding.)       Anesthesia Quick Evaluation

## 2022-09-23 ENCOUNTER — Encounter: Payer: Self-pay | Admitting: Ophthalmology

## 2022-09-25 ENCOUNTER — Other Ambulatory Visit: Payer: Self-pay | Admitting: Cardiovascular Disease

## 2022-09-29 ENCOUNTER — Ambulatory Visit: Payer: Medicare Other | Attending: Cardiovascular Disease

## 2022-09-29 DIAGNOSIS — H353212 Exudative age-related macular degeneration, right eye, with inactive choroidal neovascularization: Secondary | ICD-10-CM | POA: Diagnosis not present

## 2022-09-29 DIAGNOSIS — Z5181 Encounter for therapeutic drug level monitoring: Secondary | ICD-10-CM | POA: Diagnosis not present

## 2022-09-29 DIAGNOSIS — I4819 Other persistent atrial fibrillation: Secondary | ICD-10-CM

## 2022-09-29 DIAGNOSIS — Z7901 Long term (current) use of anticoagulants: Secondary | ICD-10-CM | POA: Diagnosis not present

## 2022-09-29 LAB — POCT INR: INR: 2.1 (ref 2.0–3.0)

## 2022-09-29 NOTE — Patient Instructions (Signed)
CONTINUE  0.5 tablet Daily, EXCEPT NONE ON MONDAY and FRIDAY.   INR in 6 weeks.  [phone]

## 2022-09-30 ENCOUNTER — Other Ambulatory Visit: Payer: Self-pay | Admitting: Cardiothoracic Surgery

## 2022-09-30 DIAGNOSIS — I7121 Aneurysm of the ascending aorta, without rupture: Secondary | ICD-10-CM

## 2022-10-20 ENCOUNTER — Telehealth: Payer: Self-pay | Admitting: Cardiovascular Disease

## 2022-10-20 DIAGNOSIS — I4819 Other persistent atrial fibrillation: Secondary | ICD-10-CM

## 2022-10-20 MED ORDER — WARFARIN SODIUM 2.5 MG PO TABS: ORAL_TABLET | ORAL | 2 refills | Status: DC

## 2022-10-20 NOTE — Telephone Encounter (Signed)
*  STAT* If patient is at the pharmacy, call can be transferred to refill team.   1. Which medications need to be refilled? (please list name of each medication and dose if known)   warfarin (COUMADIN) 5 MG tablet   2. Which pharmacy/location (including street and city if local pharmacy) is medication to be sent to?  [place], [place]   3. Do they need a 30 day or 90 day supply?   30 day  Patient stated that he would need the refill for 2.5 mg as the 5 mg was too strong.  Patient stated he has 5 tablets left.

## 2022-10-20 NOTE — Telephone Encounter (Signed)
Prescription refill request received for warfarin Lov: 07/13/22 Fletcher Anon) Next INR check: 11/10/22 Warfarin tablet strength: 2.56m  Per pt request, pt wishes to switch to 2.579mtablet to prevent him from having to split his pills everyday. Per Anticoagulation track, pt takes 2.63m563maily except nothing on Mondays and Fridays. Made pt aware that when he starts the new 2.63mg33mblet, he will no longer need to split the tablet. Also advised pt to discuss adjusting Warfarin regimen at his next visit so he takes a small amount of Warfarin daily instead of skipping Warfarin on Mondays and Fridays. Pt verbalized understanding. Note placed on upcoming coumadin clinic appt to make [place].

## 2022-11-04 DIAGNOSIS — E11319 Type 2 diabetes mellitus with unspecified diabetic retinopathy without macular edema: Secondary | ICD-10-CM | POA: Diagnosis not present

## 2022-11-04 DIAGNOSIS — H353212 Exudative age-related macular degeneration, right eye, with inactive choroidal neovascularization: Secondary | ICD-10-CM | POA: Diagnosis not present

## 2022-11-04 DIAGNOSIS — H353222 Exudative age-related macular degeneration, left eye, with inactive choroidal neovascularization: Secondary | ICD-10-CM | POA: Diagnosis not present

## 2022-11-04 DIAGNOSIS — H26491 Other secondary cataract, right eye: Secondary | ICD-10-CM | POA: Diagnosis not present

## 2022-11-06 ENCOUNTER — Other Ambulatory Visit: Payer: Self-pay | Admitting: Nurse Practitioner

## 2022-11-06 ENCOUNTER — Other Ambulatory Visit: Payer: Self-pay | Admitting: Cardiovascular Disease

## 2022-11-06 DIAGNOSIS — E785 Hyperlipidemia, unspecified: Secondary | ICD-10-CM

## 2022-11-10 ENCOUNTER — Ambulatory Visit: Payer: Medicare Other | Attending: Cardiovascular Disease

## 2022-11-10 DIAGNOSIS — Z7901 Long term (current) use of anticoagulants: Secondary | ICD-10-CM | POA: Diagnosis not present

## 2022-11-10 DIAGNOSIS — I4819 Other persistent atrial fibrillation: Secondary | ICD-10-CM | POA: Insufficient documentation

## 2022-11-10 LAB — POCT INR: INR: 3.2 — AB (ref 2.0–3.0)

## 2022-11-10 NOTE — Patient Instructions (Signed)
Description   Skip today's dosage of Warfarin, then start taking 1/2 tablet (1.'25mg'$ ) daily except 1 tablet (2.'5mg'$ ) on Sundays, Tuesdays, and Thursdays.  Recheck INR in 4 weeks.  [phone]

## 2022-11-15 ENCOUNTER — Ambulatory Visit (INDEPENDENT_AMBULATORY_CARE_PROVIDER_SITE_OTHER): Payer: Medicare Other | Admitting: Cardiothoracic Surgery

## 2022-11-15 ENCOUNTER — Ambulatory Visit
Admission: RE | Admit: 2022-11-15 | Discharge: 2022-11-15 | Disposition: A | Discharge status: Home / Self Care | Payer: Medicare Other | Source: Ambulatory Visit | Attending: Cardiothoracic Surgery | Admitting: Cardiothoracic Surgery

## 2022-11-15 ENCOUNTER — Encounter: Payer: Self-pay | Admitting: Cardiothoracic Surgery

## 2022-11-15 ENCOUNTER — Other Ambulatory Visit: Payer: PRIVATE HEALTH INSURANCE

## 2022-11-15 VITALS — BP 116/78 | HR 78 | Resp 20 | Ht 67.0 in | Wt 155.0 lb

## 2022-11-15 DIAGNOSIS — I7121 Aneurysm of the ascending aorta, without rupture: Secondary | ICD-10-CM

## 2022-11-15 DIAGNOSIS — I700 Atherosclerosis of aorta: Secondary | ICD-10-CM | POA: Diagnosis not present

## 2022-11-15 DIAGNOSIS — L905 Scar conditions and fibrosis of skin: Secondary | ICD-10-CM | POA: Diagnosis not present

## 2022-11-15 DIAGNOSIS — Z9889 Other specified postprocedural states: Secondary | ICD-10-CM | POA: Diagnosis not present

## 2022-11-15 DIAGNOSIS — I712 Thoracic aortic aneurysm, without rupture, unspecified: Secondary | ICD-10-CM | POA: Diagnosis not present

## 2022-11-15 DIAGNOSIS — J439 Emphysema, unspecified: Secondary | ICD-10-CM | POA: Diagnosis not present

## 2022-11-15 NOTE — Progress Notes (Signed)
HPI: The patient returns for scheduled annual visit with surveillance CT scan of chest without contrast for a asymptomatic stable 4.4 cm fusiform ascending aneurysm.  This has been stable throughout the past 5 years.  The patient is status post multivessel CABG by Dr. Servando Snare in 2017.  He is followed by Dr. Fletcher Anon.  He is on chronic Coumadin for persistent atrial fibrillation.  He denies any symptoms of angina or CHF.  He has had some intermittent episodes of chest tightness and decreased energy probably associated with rapid atrial fibrillation.  Within the past 2 months he has had bilateral cataract operations and has done well.  I reviewed the images of his CT scan today which shows a stable 4.45 fusiform ascending aneurysm.  Mild calcification of the arch.  No evidence of hematoma or mural ulceration.  Lungs are clear.  Current Outpatient Medications  Medication Sig Dispense Refill   Cholecalciferol (VITAMIN D3 PO) Take 6,000 Units by mouth. Taking 1 capsule daily     ezetimibe (ZETIA) 10 MG tablet Take 1 tablet (10 mg total) by mouth daily. 30 tablet 1   fenofibrate 160 MG tablet Take 160 mg by mouth.      gabapentin (NEURONTIN) 600 MG tablet Take 600 mg by mouth 3 (three) times daily.     levothyroxine (SYNTHROID) 50 MCG tablet Take 50 mcg by mouth daily.     lisinopril (ZESTRIL) 5 MG tablet Take 1 tablet by mouth once daily 90 tablet 0   Multiple Vitamins-Minerals (ICAPS AREDS 2 PO) Take by mouth 2 times daily at 12 noon and 4 pm.     Multiple Vitamins-Minerals (ZINC PO) Take by mouth. Taking 2 capsules daily     rosuvastatin (CRESTOR) 5 MG tablet Take 1 tablet by mouth once daily 90 tablet 0   sildenafil (REVATIO) 20 MG tablet Take 60-80 mg by mouth as directed.     VITAMIN E PO Take 1 tablet by mouth daily.     warfarin (COUMADIN) 2.5 MG tablet TAKE 1 TABLET DAILY OR AS DIRECTED BY COUMADIN CLINIC 30 tablet 2   No current facility-administered medications for this visit.     Physical  Exam: Blood pressure 116/78, pulse 78, resp. rate 20, height 5\' 7"  (1.702 m), weight 155 lb (70.3 kg), SpO2 94 %.    \     Exam    General- alert and comfortable    Neck- no JVD, no cervical adenopathy palpable, no carotid bruit   Lungs- clear without rales, wheezes.  Well-healed sternal incision.   Cor- irregular rate and rhythm, no murmur , gallop   Abdomen- soft, non-tender   Extremities - warm, non-tender, minimal edema   Neuro- oriented, appropriate, no focal weakness  Diagnostic Tests: CT scan performed today, images personally reviewed.  Results show a stable 4.45 cm fusiform ascending aneurysm..  Impression: No change in a mild fusiform ascending aneurysm. He understands importance of continuing his Zestril for good blood pressure control.  Plan annual surveillance CT scan without contrast.  Plan: Return March 2025 for CT of chest without contrast to follow-up ascending fusiform TAA. Continue to follow heart healthy diet and heart healthy lifestyle  Dahlia Byes, MD [facility] [phone]

## 2022-12-02 DIAGNOSIS — E11319 Type 2 diabetes mellitus with unspecified diabetic retinopathy without macular edema: Secondary | ICD-10-CM | POA: Diagnosis not present

## 2022-12-02 DIAGNOSIS — H26491 Other secondary cataract, right eye: Secondary | ICD-10-CM | POA: Diagnosis not present

## 2022-12-02 DIAGNOSIS — H353212 Exudative age-related macular degeneration, right eye, with inactive choroidal neovascularization: Secondary | ICD-10-CM | POA: Diagnosis not present

## 2022-12-02 DIAGNOSIS — H353222 Exudative age-related macular degeneration, left eye, with inactive choroidal neovascularization: Secondary | ICD-10-CM | POA: Diagnosis not present

## 2022-12-07 DIAGNOSIS — E039 Hypothyroidism, unspecified: Secondary | ICD-10-CM | POA: Diagnosis not present

## 2022-12-07 DIAGNOSIS — N529 Male erectile dysfunction, unspecified: Secondary | ICD-10-CM | POA: Diagnosis not present

## 2022-12-07 DIAGNOSIS — R7301 Impaired fasting glucose: Secondary | ICD-10-CM | POA: Diagnosis not present

## 2022-12-07 DIAGNOSIS — N1831 Chronic kidney disease, stage 3a: Secondary | ICD-10-CM | POA: Diagnosis not present

## 2022-12-07 DIAGNOSIS — R7989 Other specified abnormal findings of blood chemistry: Secondary | ICD-10-CM | POA: Diagnosis not present

## 2022-12-07 DIAGNOSIS — Z125 Encounter for screening for malignant neoplasm of prostate: Secondary | ICD-10-CM | POA: Diagnosis not present

## 2022-12-07 DIAGNOSIS — E785 Hyperlipidemia, unspecified: Secondary | ICD-10-CM | POA: Diagnosis not present

## 2022-12-07 DIAGNOSIS — I10 Essential (primary) hypertension: Secondary | ICD-10-CM | POA: Diagnosis not present

## 2022-12-08 ENCOUNTER — Ambulatory Visit: Payer: Medicare Other | Attending: Cardiovascular Disease

## 2022-12-08 DIAGNOSIS — Z7901 Long term (current) use of anticoagulants: Secondary | ICD-10-CM

## 2022-12-08 DIAGNOSIS — I4819 Other persistent atrial fibrillation: Secondary | ICD-10-CM | POA: Diagnosis not present

## 2022-12-08 LAB — POCT INR: INR: 3.5 — AB (ref 2.0–3.0)

## 2022-12-08 NOTE — Patient Instructions (Addendum)
Description   Skip today's dosage of Warfarin, then start taking 1/2 tablet (1.25mg ) daily except 1 tablet (2.5mg ) on Sundays and Thursdays.  Recheck INR in 3-4 weeks.  [phone]

## 2022-12-14 DIAGNOSIS — Z1331 Encounter for screening for depression: Secondary | ICD-10-CM | POA: Diagnosis not present

## 2022-12-14 DIAGNOSIS — I480 Paroxysmal atrial fibrillation: Secondary | ICD-10-CM | POA: Diagnosis not present

## 2022-12-14 DIAGNOSIS — I700 Atherosclerosis of aorta: Secondary | ICD-10-CM | POA: Diagnosis not present

## 2022-12-14 DIAGNOSIS — I10 Essential (primary) hypertension: Secondary | ICD-10-CM | POA: Diagnosis not present

## 2022-12-14 DIAGNOSIS — E785 Hyperlipidemia, unspecified: Secondary | ICD-10-CM | POA: Diagnosis not present

## 2022-12-14 DIAGNOSIS — D6869 Other thrombophilia: Secondary | ICD-10-CM | POA: Diagnosis not present

## 2022-12-14 DIAGNOSIS — Z1339 Encounter for screening examination for other mental health and behavioral disorders: Secondary | ICD-10-CM | POA: Diagnosis not present

## 2022-12-14 DIAGNOSIS — R7301 Impaired fasting glucose: Secondary | ICD-10-CM | POA: Diagnosis not present

## 2022-12-14 DIAGNOSIS — Z0000 Encounter for general adult medical examination without abnormal findings: Secondary | ICD-10-CM | POA: Diagnosis not present

## 2022-12-14 DIAGNOSIS — I129 Hypertensive chronic kidney disease with stage 1 through stage 4 chronic kidney disease, or unspecified chronic kidney disease: Secondary | ICD-10-CM | POA: Diagnosis not present

## 2022-12-14 DIAGNOSIS — N1831 Chronic kidney disease, stage 3a: Secondary | ICD-10-CM | POA: Diagnosis not present

## 2022-12-14 DIAGNOSIS — E8881 Metabolic syndrome: Secondary | ICD-10-CM | POA: Diagnosis not present

## 2022-12-14 DIAGNOSIS — Z23 Encounter for immunization: Secondary | ICD-10-CM | POA: Diagnosis not present

## 2022-12-17 DIAGNOSIS — H353222 Exudative age-related macular degeneration, left eye, with inactive choroidal neovascularization: Secondary | ICD-10-CM | POA: Diagnosis not present

## 2022-12-17 DIAGNOSIS — H353212 Exudative age-related macular degeneration, right eye, with inactive choroidal neovascularization: Secondary | ICD-10-CM | POA: Diagnosis not present

## 2022-12-17 DIAGNOSIS — H26491 Other secondary cataract, right eye: Secondary | ICD-10-CM | POA: Diagnosis not present

## 2022-12-17 DIAGNOSIS — E11319 Type 2 diabetes mellitus with unspecified diabetic retinopathy without macular edema: Secondary | ICD-10-CM | POA: Diagnosis not present

## 2022-12-30 DIAGNOSIS — M8589 Other specified disorders of bone density and structure, multiple sites: Secondary | ICD-10-CM | POA: Diagnosis not present

## 2023-01-05 ENCOUNTER — Ambulatory Visit: Payer: Medicare Other | Attending: Cardiovascular Disease | Admitting: *Deleted

## 2023-01-05 DIAGNOSIS — I4819 Other persistent atrial fibrillation: Secondary | ICD-10-CM | POA: Diagnosis not present

## 2023-01-05 DIAGNOSIS — Z5181 Encounter for therapeutic drug level monitoring: Secondary | ICD-10-CM | POA: Diagnosis not present

## 2023-01-05 LAB — POCT INR: INR: 2.7 (ref 2.0–3.0)

## 2023-01-05 NOTE — Patient Instructions (Signed)
Continue warfarin 1/2 tablet (1.25mg ) daily except 1 tablet (2.5mg ) on Sundays and Thursdays.  Recheck INR in 4 weeks.  [phone]

## 2023-01-06 DIAGNOSIS — H353212 Exudative age-related macular degeneration, right eye, with inactive choroidal neovascularization: Secondary | ICD-10-CM | POA: Diagnosis not present

## 2023-01-23 ENCOUNTER — Other Ambulatory Visit: Payer: Self-pay | Admitting: Cardiovascular Disease

## 2023-01-23 ENCOUNTER — Other Ambulatory Visit: Payer: Self-pay | Admitting: Nurse Practitioner

## 2023-01-23 DIAGNOSIS — E785 Hyperlipidemia, unspecified: Secondary | ICD-10-CM

## 2023-01-25 ENCOUNTER — Other Ambulatory Visit: Payer: Self-pay | Admitting: *Deleted

## 2023-01-25 DIAGNOSIS — I714 Abdominal aortic aneurysm, without rupture, unspecified: Secondary | ICD-10-CM

## 2023-01-25 NOTE — Telephone Encounter (Signed)
Last visit 07/13/22 with plan for Follow up in May after the AAA duplex.    Could you please schedule [place] and f/u as advised at last visit?  Thanks!

## 2023-01-25 NOTE — Telephone Encounter (Signed)
Patient scheduled ultrasound and follow up

## 2023-01-25 NOTE — Telephone Encounter (Signed)
Thank you :)

## 2023-02-02 ENCOUNTER — Ambulatory Visit: Payer: Medicare Other | Attending: Cardiovascular Disease

## 2023-02-02 DIAGNOSIS — Z7901 Long term (current) use of anticoagulants: Secondary | ICD-10-CM | POA: Insufficient documentation

## 2023-02-02 DIAGNOSIS — I4819 Other persistent atrial fibrillation: Secondary | ICD-10-CM

## 2023-02-02 LAB — POCT INR: INR: 5.7 — AB (ref 2.0–3.0)

## 2023-02-02 NOTE — Patient Instructions (Signed)
HOLD TODAY, THURSDAY and FRIDAY then Continue warfarin 1/2 tablet (1.25mg ) daily except 1 tablet (2.5mg ) on Sundays and Thursdays.  Recheck INR in 1 week.  [phone]

## 2023-02-09 ENCOUNTER — Ambulatory Visit: Payer: Medicare Other | Attending: Cardiovascular Disease

## 2023-02-09 DIAGNOSIS — I4819 Other persistent atrial fibrillation: Secondary | ICD-10-CM | POA: Insufficient documentation

## 2023-02-09 DIAGNOSIS — Z7901 Long term (current) use of anticoagulants: Secondary | ICD-10-CM | POA: Diagnosis not present

## 2023-02-09 LAB — POCT INR: INR: 2.1 (ref 2.0–3.0)

## 2023-02-09 NOTE — Patient Instructions (Signed)
Description   Continue on Warfarin 1/2 tablet (1.25mg ) daily except 1 tablet (2.5mg ) on Thursdays.  Recheck INR in 2 weeks.  [phone]

## 2023-02-17 DIAGNOSIS — E11319 Type 2 diabetes mellitus with unspecified diabetic retinopathy without macular edema: Secondary | ICD-10-CM | POA: Diagnosis not present

## 2023-02-17 DIAGNOSIS — H353212 Exudative age-related macular degeneration, right eye, with inactive choroidal neovascularization: Secondary | ICD-10-CM | POA: Diagnosis not present

## 2023-02-17 DIAGNOSIS — H26491 Other secondary cataract, right eye: Secondary | ICD-10-CM | POA: Diagnosis not present

## 2023-02-17 DIAGNOSIS — H353222 Exudative age-related macular degeneration, left eye, with inactive choroidal neovascularization: Secondary | ICD-10-CM | POA: Diagnosis not present

## 2023-02-23 ENCOUNTER — Ambulatory Visit: Payer: Medicare Other | Attending: Cardiovascular Disease

## 2023-02-23 ENCOUNTER — Other Ambulatory Visit: Payer: Self-pay | Admitting: Cardiovascular Disease

## 2023-02-23 DIAGNOSIS — I4819 Other persistent atrial fibrillation: Secondary | ICD-10-CM

## 2023-02-23 DIAGNOSIS — Z7901 Long term (current) use of anticoagulants: Secondary | ICD-10-CM | POA: Diagnosis not present

## 2023-02-23 LAB — POCT INR: INR: 2.6 (ref 2.0–3.0)

## 2023-02-23 NOTE — Patient Instructions (Signed)
Continue on Warfarin 1/2 tablet (1.25mg ) daily except 1 tablet (2.5mg ) on Thursdays.  Recheck INR in 4 weeks.  [phone]

## 2023-02-23 NOTE — Telephone Encounter (Signed)
Refill request

## 2023-02-23 NOTE — Telephone Encounter (Signed)
Refill request for warfarin:  Last INR was 2.6 on 02/23/23 Next INR due 03/23/23 LOV was 07/13/22  Refill approved.

## 2023-03-17 ENCOUNTER — Ambulatory Visit: Payer: Medicare Other | Attending: Cardiovascular Disease

## 2023-03-17 ENCOUNTER — Other Ambulatory Visit: Payer: Self-pay | Admitting: *Deleted

## 2023-03-17 DIAGNOSIS — I714 Abdominal aortic aneurysm, without rupture, unspecified: Secondary | ICD-10-CM | POA: Insufficient documentation

## 2023-03-17 DIAGNOSIS — I7143 Infrarenal abdominal aortic aneurysm, without rupture: Secondary | ICD-10-CM | POA: Insufficient documentation

## 2023-03-23 ENCOUNTER — Ambulatory Visit: Payer: Medicare Other | Attending: Cardiovascular Disease

## 2023-03-23 DIAGNOSIS — Z7901 Long term (current) use of anticoagulants: Secondary | ICD-10-CM | POA: Diagnosis not present

## 2023-03-23 DIAGNOSIS — I4819 Other persistent atrial fibrillation: Secondary | ICD-10-CM | POA: Diagnosis not present

## 2023-03-23 LAB — POCT INR: INR: 2.3 (ref 2.0–3.0)

## 2023-03-23 NOTE — Patient Instructions (Signed)
Continue on Warfarin 1/2 tablet (1.25mg ) daily except 1 tablet (2.5mg ) on Thursdays.  Recheck INR in 6 weeks.  [phone]

## 2023-03-28 DIAGNOSIS — H353212 Exudative age-related macular degeneration, right eye, with inactive choroidal neovascularization: Secondary | ICD-10-CM | POA: Diagnosis not present

## 2023-04-06 ENCOUNTER — Encounter: Payer: Self-pay | Admitting: Nurse Practitioner

## 2023-04-06 ENCOUNTER — Ambulatory Visit: Payer: Medicare Other | Attending: Nurse Practitioner | Admitting: Nurse Practitioner

## 2023-04-06 VITALS — BP 128/72 | HR 70 | Ht 67.5 in | Wt 148.4 lb

## 2023-04-06 DIAGNOSIS — I4811 Longstanding persistent atrial fibrillation: Secondary | ICD-10-CM | POA: Insufficient documentation

## 2023-04-06 DIAGNOSIS — N183 Chronic kidney disease, stage 3 unspecified: Secondary | ICD-10-CM | POA: Insufficient documentation

## 2023-04-06 DIAGNOSIS — I2510 Atherosclerotic heart disease of native coronary artery without angina pectoris: Secondary | ICD-10-CM | POA: Diagnosis not present

## 2023-04-06 DIAGNOSIS — I7121 Aneurysm of the ascending aorta, without rupture: Secondary | ICD-10-CM | POA: Insufficient documentation

## 2023-04-06 DIAGNOSIS — E785 Hyperlipidemia, unspecified: Secondary | ICD-10-CM | POA: Insufficient documentation

## 2023-04-06 DIAGNOSIS — I10 Essential (primary) hypertension: Secondary | ICD-10-CM | POA: Insufficient documentation

## 2023-04-06 DIAGNOSIS — I714 Abdominal aortic aneurysm, without rupture, unspecified: Secondary | ICD-10-CM | POA: Insufficient documentation

## 2023-04-06 MED ORDER — LISINOPRIL 5 MG PO TABS: 5.0000 mg | ORAL_TABLET | Freq: Every day | ORAL | 3 refills | Status: DC

## 2023-04-06 NOTE — Patient Instructions (Addendum)
Medication Instructions:   Your physician recommends that you continue on your current medications as directed. Please refer to the Current Medication list given to you today.   *If you need a refill on your cardiac medications before your next appointment, please call your pharmacy*   Lab Work:  Your provider would like for you to return to have the following labs drawn:   Lipid/ Direct LDL/CMET  Please go to [facility] [address] ([facility] [address], [place] [address] You do not need an appointment.  They are open from 7:30 am-4 pm.  Lunch from 1:00 pm- 2:00 pm You need to be fasting for your Cholesterol labs.  - You will need to be fasting. Please do not have anything to eat or drink after midnight the morning you have the lab work. You may only have water or black coffee with no cream or sugar.   You may also go to any of these [facility] locations:  [employer]  - [address] [employer] [address] Chief Technology Officer)     If you have labs (blood work) drawn today and your tests are completely normal, you will receive your results only by: [employer] (if you have MyChart) OR A paper copy in the mail If you have any lab test that is abnormal or we need to change your treatment, we will call you to review the results.   Testing/Procedures:  NO testing ordered today.   Follow-Up: At [facility], you and your health needs are our priority.  As part of our continuing mission to provide you with exceptional heart care, we have created designated Provider Care Teams.  These Care Teams include your primary Cardiologist (physician) and Advanced Practice Providers (APPs -  Physician Assistants and Nurse Practitioners) who all work together to provide you with the care you need, when you need it.  We recommend signing up for the patient portal called "MyChart".  Sign up information is provided on this After Visit Summary.  MyChart is used to  connect with patients for Virtual Visits (Telemedicine).  Patients are able to view lab/test results, encounter notes, upcoming appointments, etc.  Non-urgent messages can be sent to your provider as well.   To learn more about what you can do with MyChart, go to [email or web address].    Your next appointment:   6 month(s)  Provider:   You may see Lorine Bears, MD or one of the following Advanced Practice Providers on your designated Care Team:    Nicolasa Ducking, NP

## 2023-04-06 NOTE — Progress Notes (Signed)
Office Visit    Patient Name: Thomas Fuentes Date of Encounter: 04/06/2023  Primary Care Provider:  Cleatis Polka., MD Primary Cardiologist:  Lorine Bears, MD  Chief Complaint    82 y.o. male with history of CAD status post non-STEMI and CABG in December 2017, persistent atrial fibrillation, hypertension, hyperlipidemia, ascending aortic and abdominal aneurysms, stage III chronic kidney disease, and cataracts, who presents for follow-up of CAD.   Past Medical History    Past Medical History:  Diagnosis Date   AAA (abdominal aortic aneurysm) (HCC)    a. 05/2019 U/S: 3.1cm AAA; b. 12/2021 Abd Ao U/S: 3.3 cm AAA.   Ascending aortic aneurysm (HCC)    a. 10/2019 CTA Chest: 4.3cm asc TAA; b. 10/2020 CTA Chest: 4.2cm asc TAA; c. 10/2021 CTA Chest: 4.4cm TAA.   Chronic renal disease, stage III (HCC)    Coronary artery disease    a. MI 2000 s/p PCI and 2 stent placement to unknown arteries;  b. LHC 08/09/2016 o-pLAD 30%, mLAD 99%, OM2 50%, OM3 85%, pRCA 99%, mid RCA 60%, RPDA 60%, LVEDP mod elevated. CABG in 07/2016 :  LIMA to LAD, SVG to RCA , SVG to LCX; c. 10/2019 MV: No ischemia or scar. Low risk.   History of echocardiogram    a. 02/2022 Echo: EF 50-55%, no rwma, nl RV fxn, mod dil LA, mild MR. Ao sclerosis. Ao root 45mm. Asc Ao 38mm.   Hyperlipidemia    Hypertension    Hypothyroidism    Macular degeneration    bilateral   Metabolic syndrome    MI (myocardial infarction) (HCC)    Peripheral neuropathy    Peripheral vascular disease (HCC)    Past Surgical History:  Procedure Laterality Date   CARDIAC CATHETERIZATION  2000   [facility] x2 stents   CARDIAC CATHETERIZATION N/A 08/09/2016   Procedure: Left Heart Cath and Coronary Angiography;  Surgeon: Iran Ouch, MD;  Location: [facility] INVASIVE CV LAB;  Service: Cardiovascular;  Laterality: N/A;   CATARACT EXTRACTION W/PHACO Left 09/07/2022   Procedure: CATARACT EXTRACTION PHACO AND INTRAOCULAR LENS PLACEMENT (IOC) LEFT 9.87  00:56.1;  Surgeon: Galen Manila, MD;  Location: [facility];  Service: Ophthalmology;  Laterality: Left;   CATARACT EXTRACTION W/PHACO Right 09/21/2022   Procedure: CATARACT EXTRACTION PHACO AND INTRAOCULAR LENS PLACEMENT (IOC) RIGHT 7.31 00:48.6;  Surgeon: Galen Manila, MD;  Location: [facility];  Service: Ophthalmology;  Laterality: Right;   CORONARY ARTERY BYPASS GRAFT N/A 08/11/2016   Procedure: CORONARY ARTERY BYPASS GRAFTING on pump using left internal mammary artery to left anterior descending coronary artery , portion of right greater saphenous vein to right coronary artery, portion of right greater saphenous vein graft to distal circumflex.;  Surgeon: Delight Ovens, MD;  Location: [facility] OR;  Service: Open Heart Surgery;  Laterality: N/A;   CORONARY STENT PLACEMENT     ENDOVEIN HARVEST OF GREATER SAPHENOUS VEIN Right 08/11/2016   Procedure: ENDOVEIN HARVEST OF GREATER SAPHENOUS VEIN;  Surgeon: Delight Ovens, MD;  Location: [facility] OR;  Service: Open Heart Surgery;  Laterality: Right;   TEE WITHOUT CARDIOVERSION N/A 08/11/2016   Procedure: TRANSESOPHAGEAL ECHOCARDIOGRAM (TEE);  Surgeon: Delight Ovens, MD;  Location: [facility] OR;  Service: Open Heart Surgery;  Laterality: N/A;    Allergies  Allergies  Allergen Reactions   Lidocaine Anaphylaxis and Other (See Comments)    novacaine and lidocaine  Other Reaction(s): small doses ok   Meloxicam Other (See Comments)   Procaine  Other (See Comments)   Quinolones     Patient was warned about not using Cipro and similar antibiotics. Recent studies have raised concern that fluoroquinolone antibiotics could be associated with an increased risk of aortic aneurysm Fluoroquinolones have non-antimicrobial properties that might jeopardise the integrity of the extracellular matrix of the vascular wall In a  propensity score matched cohort study in [place], there was a 66% increased rate of aortic aneurysm or dissection  associated with oral fluoroquinolone use, compared wit   Statins Nausea And Vomiting    GI symptoms on almost all statins Other reaction(s): GI intolerance (Lipitor, Zocor)    History of Present Illness      82 y.o. y/o male with the above complex past medical history including CAD, hypertension, hyperlipidemia, persistent atrial fibrillation, ascending aortic aneurysm, abdominal aortic aneurysm, and stage III chronic kidney disease. Cardiac history dates back to 2000, when he suffered an MI and underwent PCI and stent placement. Unfortunately, he suffered a non-STEMI in December 2017, and underwent diagnostic catheterization revealing severe mid LAD, OM 3, and RCA disease. He subsequently underwent CABG x3. Stress testing in March 2021, in the setting of chest pain, showed no ischemia or scar, and was deemed low risk.  He was found to be in rate-controlled, asymptomatic Afib in 02/2022, and decision was made to continue rate-control, while eliquis was added, and later switched to warfarin due to cost.  Echo in 02/2022, showed an EF of 50 to 55% with moderately dilated left atrium, and mild MR.  He has been followed closely by CT surgery in the setting of ascending aortic aneurysm. CTA of the chest in March 2024 showed slight growth of the aneurysm to 4.5 cm (previously 4.4 cm). Abdominal aortic ultrasound in July 2024 showed a relatively stable abdominal aortic aneurysm at 3.5 cm, and dilation of the R common iliac artery.   Mr. Mericle was last seen in cardiology clinic in 06/2022, at which time he was doing well.  Since then, he has continued to do well.  He remains active and around his home and does not experience chest pain or dyspnea.  He denies palpitations, PND, orthopnea, dizziness, syncope, edema, or early satiety.  He recently had to move a large branch that had fallen due to bad weather, and developed some bruising to his right upper arm after he tripped over some other branches, though did not  experience any bleeding or other issues.  Bruising is already resolving.  Home Medications    Current Outpatient Medications  Medication Sig Dispense Refill   Cholecalciferol (VITAMIN D3 PO) Take 6,000 Units by mouth. Taking 1 capsule daily     ezetimibe (ZETIA) 10 MG tablet Take 1 tablet (10 mg total) by mouth daily. 30 tablet 1   fenofibrate 160 MG tablet Take 160 mg by mouth.      gabapentin (NEURONTIN) 600 MG tablet Take 600 mg by mouth 3 (three) times daily.     levothyroxine (SYNTHROID) 50 MCG tablet Take 50 mcg by mouth daily.     lisinopril (ZESTRIL) 5 MG tablet Take 1 tablet (5 mg total) by mouth daily. Please call office to schedule follow up 30 tablet 0   Multiple Vitamins-Minerals (ICAPS AREDS 2 PO) Take by mouth 2 times daily at 12 noon and 4 pm.     Multiple Vitamins-Minerals (ZINC PO) Take by mouth. Taking 2 capsules daily     rosuvastatin (CRESTOR) 5 MG tablet Take 1 tablet by mouth once daily 90 tablet 0  sildenafil (REVATIO) 20 MG tablet Take 60-80 mg by mouth as directed.     VITAMIN E PO Take 1 tablet by mouth daily.     warfarin (COUMADIN) 2.5 MG tablet TAKE 1/2 TO 1 TABLET BY MOUTH ONCE DAILY OR  AS  DIRECTED  BY  COUMADIN  CLINIC 30 tablet 5   No current facility-administered medications for this visit.     Review of Systems    He denies chest pain, palpitations, dyspnea, pnd, orthopnea, n, v, dizziness, syncope, edema, weight gain, or early satiety.  All other systems reviewed and are otherwise negative except as noted above.    Physical Exam    VS:  BP 128/72 (BP Location: Left Arm, Patient Position: Sitting, Cuff Size: Normal)   Pulse 70   Ht 5' 7.5" (1.715 m)   Wt 148 lb 6 oz (67.3 kg)   SpO2 98%   BMI 22.90 kg/m  , BMI Body mass index is 22.9 kg/m.     GEN: Well nourished, well developed, in no acute distress. HEENT: normal. Neck: Supple, no JVD, carotid bruits, or masses. Cardiac: IR, IR, 1/6 syst murmur @ RUSB, no rubs or gallops. No clubbing,  cyanosis, edema.  Radials 2+/PT 2+ and equal bilaterally.  Respiratory:  Respirations regular and unlabored, clear to auscultation bilaterally. GI: Soft, nontender, nondistended, BS + x 4. MS: no deformity or atrophy. Skin: warm and dry, no rash. Neuro:  Strength and sensation are intact. Psych: Normal affect.  Accessory Clinical Findings    ECG personally reviewed by me today - EKG Interpretation Date/Time:  Wednesday April 06 2023 09:08:42 EDT Ventricular Rate:  70 PR Interval:    QRS Duration:  90 QT Interval:  378 QTC Calculation: 408 R Axis:   33  Text Interpretation: Atrial fibrillation Septal infarct Confirmed by Nicolasa Ducking [phone]) on 04/06/2023 9:22:30 AM  - no acute changes.  Lab Results  Component Value Date   WBC 8.9 05/05/2022   HGB 12.6 (L) 05/05/2022   HCT 39.1 05/05/2022   MCV 92.9 05/05/2022   PLT 234 05/05/2022   Lab Results  Component Value Date   CREATININE 1.13 05/05/2022   BUN 25 (H) 05/05/2022   NA 135 05/05/2022   K 4.1 05/05/2022   CL 103 05/05/2022   CO2 24 05/05/2022   Lab Results  Component Value Date   ALT 12 03/18/2022   AST 17 03/18/2022   ALKPHOS 29 (L) 03/18/2022   BILITOT 0.7 03/18/2022   Lab Results  Component Value Date   CHOL 118 03/18/2022   HDL 31 (L) 03/18/2022   LDLCALC 60 03/18/2022   LDLDIRECT 70 03/18/2022   TRIG 133 03/18/2022   CHOLHDL 3.8 03/18/2022    Lab Results  Component Value Date   HGBA1C 6.0 (H) 08/10/2016    Assessment & Plan    1.  Coronary artery disease: Status post CABG 2017.  Low risk Myoview in 2021.  He remains active without chest pain or dyspnea.  Continue statin, Zetia, and ACE inhibitor therapy.  No aspirin in the setting of warfarin.  2.  Persistent atrial fibrillation: Incidentally noted in July 2023.  Well rate controlled in the absence of AV nodal blocking agents and asymptomatic.  He remains on warfarin therapy, is tolerating it well, and is followed closely in our  anticoagulation clinic.  3.  Essential hypertension: Blood pressure is stable today at 128/72.  Continue lisinopril.  4.  Hyperlipidemia: LDL of 70 last July.  Follow-up lipids and  LFTs today.  5.  Stage III chronic kidney disease: Basic metabolic panel today.  Continue ACE inhibitor.  6.  Abdominal aortic aneurysm: Relatively stable by recent ultrasound at 3.5 cm with dilation of the right common iliac artery noted.  Plan for follow-up imaging next year.  7.  Ascending aortic aneurysm: Followed by CT surgery and measuring 4.5 cm in March 2024.  8.  Disposition: Follow-up with complete metabolic panel and lipids with direct LDL today.  Follow-up in clinic in 6 months or sooner if necessary.  Informed Consent    Nicolasa Ducking, NP 04/06/2023, 9:24 AM

## 2023-05-04 ENCOUNTER — Ambulatory Visit: Payer: Medicare Other | Attending: Cardiovascular Disease

## 2023-05-04 DIAGNOSIS — Z7901 Long term (current) use of anticoagulants: Secondary | ICD-10-CM | POA: Diagnosis not present

## 2023-05-04 DIAGNOSIS — I4819 Other persistent atrial fibrillation: Secondary | ICD-10-CM | POA: Insufficient documentation

## 2023-05-04 LAB — POCT INR: INR: 2 (ref 2.0–3.0)

## 2023-05-04 NOTE — Patient Instructions (Signed)
Continue on Warfarin 1/2 tablet (1.25mg ) daily except 1 tablet (2.5mg ) on Thursdays.  Recheck INR in 8 weeks.  [phone]

## 2023-05-09 DIAGNOSIS — H353212 Exudative age-related macular degeneration, right eye, with inactive choroidal neovascularization: Secondary | ICD-10-CM | POA: Diagnosis not present

## 2023-05-16 ENCOUNTER — Other Ambulatory Visit: Payer: Self-pay | Admitting: Cardiovascular Disease

## 2023-05-16 DIAGNOSIS — E785 Hyperlipidemia, unspecified: Secondary | ICD-10-CM

## 2023-06-27 DIAGNOSIS — H353212 Exudative age-related macular degeneration, right eye, with inactive choroidal neovascularization: Secondary | ICD-10-CM | POA: Diagnosis not present

## 2023-06-29 ENCOUNTER — Ambulatory Visit: Payer: Medicare Other | Attending: Cardiovascular Disease

## 2023-06-29 DIAGNOSIS — Z85828 Personal history of other malignant neoplasm of skin: Secondary | ICD-10-CM | POA: Diagnosis not present

## 2023-06-29 DIAGNOSIS — L570 Actinic keratosis: Secondary | ICD-10-CM | POA: Diagnosis not present

## 2023-06-29 DIAGNOSIS — Z7901 Long term (current) use of anticoagulants: Secondary | ICD-10-CM | POA: Insufficient documentation

## 2023-06-29 DIAGNOSIS — L814 Other melanin hyperpigmentation: Secondary | ICD-10-CM | POA: Diagnosis not present

## 2023-06-29 DIAGNOSIS — I4819 Other persistent atrial fibrillation: Secondary | ICD-10-CM | POA: Insufficient documentation

## 2023-06-29 DIAGNOSIS — D225 Melanocytic nevi of trunk: Secondary | ICD-10-CM | POA: Diagnosis not present

## 2023-06-29 DIAGNOSIS — L578 Other skin changes due to chronic exposure to nonionizing radiation: Secondary | ICD-10-CM | POA: Diagnosis not present

## 2023-06-29 DIAGNOSIS — L821 Other seborrheic keratosis: Secondary | ICD-10-CM | POA: Diagnosis not present

## 2023-06-29 LAB — POCT INR: INR: 2.5 (ref 2.0–3.0)

## 2023-06-29 NOTE — Patient Instructions (Signed)
Continue on Warfarin 1/2 tablet (1.25mg ) daily except 1 tablet (2.5mg ) on Thursdays.  Recheck INR in 6 weeks.  [phone]

## 2023-07-21 ENCOUNTER — Other Ambulatory Visit: Payer: Self-pay | Admitting: Cardiovascular Disease

## 2023-07-21 DIAGNOSIS — E785 Hyperlipidemia, unspecified: Secondary | ICD-10-CM

## 2023-07-21 NOTE — Telephone Encounter (Signed)
Last office visit 04/06/23 with plan to f/u in 6 months next office visit: none/active recall

## 2023-08-10 ENCOUNTER — Ambulatory Visit: Payer: Medicare Other | Attending: Cardiovascular Disease

## 2023-08-10 DIAGNOSIS — Z7901 Long term (current) use of anticoagulants: Secondary | ICD-10-CM | POA: Diagnosis not present

## 2023-08-10 DIAGNOSIS — I4819 Other persistent atrial fibrillation: Secondary | ICD-10-CM | POA: Diagnosis not present

## 2023-08-10 LAB — POCT INR: INR: 1.8 — AB (ref 2.0–3.0)

## 2023-08-10 NOTE — Patient Instructions (Signed)
TAKE 1 TABLET TODAY then Continue  1/2 tablet (1.25mg ) daily except 1 tablet (2.5mg ) on Sundays and Thursdays.  Recheck INR in 6 weeks.  [phone]

## 2023-08-17 DIAGNOSIS — H353212 Exudative age-related macular degeneration, right eye, with inactive choroidal neovascularization: Secondary | ICD-10-CM | POA: Diagnosis not present

## 2023-09-21 ENCOUNTER — Telehealth: Payer: Self-pay

## 2023-09-21 ENCOUNTER — Ambulatory Visit
Admission: RE | Admit: 2023-09-21 | Discharge: 2023-09-21 | Disposition: A | Discharge status: Home / Self Care | Payer: Medicare Other | Source: Ambulatory Visit | Attending: Adult Health | Admitting: Adult Health

## 2023-09-21 ENCOUNTER — Encounter: Payer: Self-pay | Admitting: Adult Health

## 2023-09-21 ENCOUNTER — Ambulatory Visit: Payer: Medicare Other

## 2023-09-21 ENCOUNTER — Other Ambulatory Visit: Payer: Self-pay | Admitting: Adult Health

## 2023-09-21 DIAGNOSIS — R310 Gross hematuria: Secondary | ICD-10-CM

## 2023-09-21 DIAGNOSIS — K5730 Diverticulosis of large intestine without perforation or abscess without bleeding: Secondary | ICD-10-CM | POA: Diagnosis not present

## 2023-09-21 DIAGNOSIS — I480 Paroxysmal atrial fibrillation: Secondary | ICD-10-CM | POA: Diagnosis not present

## 2023-09-21 DIAGNOSIS — I10 Essential (primary) hypertension: Secondary | ICD-10-CM | POA: Diagnosis not present

## 2023-09-21 DIAGNOSIS — F17201 Nicotine dependence, unspecified, in remission: Secondary | ICD-10-CM | POA: Diagnosis not present

## 2023-09-21 DIAGNOSIS — N390 Urinary tract infection, site not specified: Secondary | ICD-10-CM | POA: Diagnosis not present

## 2023-09-21 DIAGNOSIS — D6869 Other thrombophilia: Secondary | ICD-10-CM | POA: Diagnosis not present

## 2023-09-21 DIAGNOSIS — K760 Fatty (change of) liver, not elsewhere classified: Secondary | ICD-10-CM | POA: Diagnosis not present

## 2023-09-21 DIAGNOSIS — R82998 Other abnormal findings in urine: Secondary | ICD-10-CM | POA: Diagnosis not present

## 2023-09-21 DIAGNOSIS — I7143 Infrarenal abdominal aortic aneurysm, without rupture: Secondary | ICD-10-CM | POA: Diagnosis not present

## 2023-09-21 DIAGNOSIS — N3289 Other specified disorders of bladder: Secondary | ICD-10-CM | POA: Diagnosis not present

## 2023-09-21 MED ORDER — IOPAMIDOL (ISOVUE-370) INJECTION 76%
80.0000 mL | Freq: Once | INTRAVENOUS | Status: AC | PRN
  Administered 2023-09-21: 80 mL via INTRAVENOUS

## 2023-09-21 NOTE — Telephone Encounter (Signed)
I received call from patient informing me that he had blood in urine and went to physician, who then drew an INR, resulting with 2.1.  I encouraged patient to continue same dose of Warfarin and call us if started on antibiotic.  He verbalized understanding.

## 2023-09-28 DIAGNOSIS — L570 Actinic keratosis: Secondary | ICD-10-CM | POA: Diagnosis not present

## 2023-09-28 DIAGNOSIS — L820 Inflamed seborrheic keratosis: Secondary | ICD-10-CM | POA: Diagnosis not present

## 2023-10-03 DIAGNOSIS — R310 Gross hematuria: Secondary | ICD-10-CM | POA: Diagnosis not present

## 2023-10-03 DIAGNOSIS — Q631 Lobulated, fused and horseshoe kidney: Secondary | ICD-10-CM | POA: Diagnosis not present

## 2023-10-03 DIAGNOSIS — C678 Malignant neoplasm of overlapping sites of bladder: Secondary | ICD-10-CM | POA: Diagnosis not present

## 2023-10-05 ENCOUNTER — Telehealth: Payer: Self-pay

## 2023-10-05 NOTE — Telephone Encounter (Signed)
 I received call from patient informing me that Dr Secundino Dach advised him to stop Coumadin  until upcoming surgery.  The patient said that Dr Secundino Dach was going to reach out to Dr Alvenia Aus for further advisement.

## 2023-10-10 DIAGNOSIS — H353212 Exudative age-related macular degeneration, right eye, with inactive choroidal neovascularization: Secondary | ICD-10-CM | POA: Diagnosis not present

## 2023-10-11 ENCOUNTER — Other Ambulatory Visit: Payer: Self-pay | Admitting: Surgery

## 2023-10-11 DIAGNOSIS — I714 Abdominal aortic aneurysm, without rupture, unspecified: Secondary | ICD-10-CM

## 2023-10-12 ENCOUNTER — Encounter: Payer: Self-pay | Admitting: *Deleted

## 2023-10-13 ENCOUNTER — Ambulatory Visit: Payer: Medicare Other | Attending: Nurse Practitioner | Admitting: Nurse Practitioner

## 2023-10-13 ENCOUNTER — Encounter: Payer: Self-pay | Admitting: Nurse Practitioner

## 2023-10-13 VITALS — BP 136/88 | HR 74 | Ht 67.5 in | Wt 154.2 lb

## 2023-10-13 DIAGNOSIS — I7121 Aneurysm of the ascending aorta, without rupture: Secondary | ICD-10-CM | POA: Insufficient documentation

## 2023-10-13 DIAGNOSIS — I2510 Atherosclerotic heart disease of native coronary artery without angina pectoris: Secondary | ICD-10-CM | POA: Diagnosis present

## 2023-10-13 DIAGNOSIS — I714 Abdominal aortic aneurysm, without rupture, unspecified: Secondary | ICD-10-CM | POA: Diagnosis not present

## 2023-10-13 DIAGNOSIS — N183 Chronic kidney disease, stage 3 unspecified: Secondary | ICD-10-CM | POA: Insufficient documentation

## 2023-10-13 DIAGNOSIS — Z01810 Encounter for preprocedural cardiovascular examination: Secondary | ICD-10-CM | POA: Insufficient documentation

## 2023-10-13 DIAGNOSIS — I10 Essential (primary) hypertension: Secondary | ICD-10-CM | POA: Diagnosis present

## 2023-10-13 DIAGNOSIS — E785 Hyperlipidemia, unspecified: Secondary | ICD-10-CM | POA: Diagnosis not present

## 2023-10-13 DIAGNOSIS — I4821 Permanent atrial fibrillation: Secondary | ICD-10-CM | POA: Diagnosis present

## 2023-10-13 NOTE — Progress Notes (Addendum)
Office Visit    Patient Name: Thomas Fuentes Date of Encounter: 10/13/2023  Primary Care Provider:  Cleatis Polka., MD Primary Cardiologist:  Lorine Bears, MD  Chief Complaint    83 y.o. male with a history of CAD status post non-STEMI and CABG in December 2017, persistent atrial fibrillation, hypertension, hyperlipidemia, ascending aortic and abdominal aneurysm, stage III chronic disease, and cataracts, who presents for follow-up related to CAD and preoperative clearance with recent finding of hematuria and bladder tumor.  Past Medical History  Subjective   Past Medical History:  Diagnosis Date   AAA (abdominal aortic aneurysm) (HCC)    a. 05/2019 U/S: 3.1cm AAA; b. 12/2021 Abd Ao U/S: 3.3 cm AAA.   Ascending aortic aneurysm (HCC)    a. 10/2019 CTA Chest: 4.3cm asc TAA; b. 10/2020 CTA Chest: 4.2cm asc TAA; c. 10/2021 CTA Chest: 4.4cm TAA.   Chronic renal disease, stage III (HCC)    Coronary artery disease    a. MI 2000 s/p PCI and 2 stent placement to unknown arteries;  b. LHC 08/09/2016 o-pLAD 30%, mLAD 99%, OM2 50%, OM3 85%, pRCA 99%, mid RCA 60%, RPDA 60%, LVEDP mod elevated. CABG in 07/2016 :  LIMA to LAD, SVG to RCA , SVG to LCX; c. 10/2019 MV: No ischemia or scar. Low risk.   History of echocardiogram    a. 02/2022 Echo: EF 50-55%, no rwma, nl RV fxn, mod dil LA, mild MR. Ao sclerosis. Ao root 45mm. Asc Ao 38mm.   Hyperlipidemia    Hypertension    Hypothyroidism    Macular degeneration    bilateral   Metabolic syndrome    MI (myocardial infarction) (HCC)    Peripheral neuropathy    Peripheral vascular disease (HCC)    Past Surgical History:  Procedure Laterality Date   CARDIAC CATHETERIZATION  2000   [facility] x2 stents   CARDIAC CATHETERIZATION N/A 08/09/2016   Procedure: Left Heart Cath and Coronary Angiography;  Surgeon: Iran Ouch, MD;  Location: [facility] INVASIVE CV LAB;  Service: Cardiovascular;  Laterality: N/A;   CATARACT EXTRACTION W/PHACO Left 09/07/2022    Procedure: CATARACT EXTRACTION PHACO AND INTRAOCULAR LENS PLACEMENT (IOC) LEFT 9.87 00:56.1;  Surgeon: Galen Manila, MD;  Location: [facility];  Service: Ophthalmology;  Laterality: Left;   CATARACT EXTRACTION W/PHACO Right 09/21/2022   Procedure: CATARACT EXTRACTION PHACO AND INTRAOCULAR LENS PLACEMENT (IOC) RIGHT 7.31 00:48.6;  Surgeon: Galen Manila, MD;  Location: [facility];  Service: Ophthalmology;  Laterality: Right;   CORONARY ARTERY BYPASS GRAFT N/A 08/11/2016   Procedure: CORONARY ARTERY BYPASS GRAFTING on pump using left internal mammary artery to left anterior descending coronary artery , portion of right greater saphenous vein to right coronary artery, portion of right greater saphenous vein graft to distal circumflex.;  Surgeon: Delight Ovens, MD;  Location: [facility] OR;  Service: Open Heart Surgery;  Laterality: N/A;   CORONARY STENT PLACEMENT     ENDOVEIN HARVEST OF GREATER SAPHENOUS VEIN Right 08/11/2016   Procedure: ENDOVEIN HARVEST OF GREATER SAPHENOUS VEIN;  Surgeon: Delight Ovens, MD;  Location: [facility] OR;  Service: Open Heart Surgery;  Laterality: Right;   TEE WITHOUT CARDIOVERSION N/A 08/11/2016   Procedure: TRANSESOPHAGEAL ECHOCARDIOGRAM (TEE);  Surgeon: Delight Ovens, MD;  Location: [facility] OR;  Service: Open Heart Surgery;  Laterality: N/A;    Allergies  Allergies  Allergen Reactions   Lidocaine Anaphylaxis and Other (See Comments)    novacaine and lidocaine  Other Reaction(s):  small doses ok   Meloxicam Other (See Comments)   Procaine Other (See Comments)   Quinolones     Patient was warned about not using Cipro and similar antibiotics. Recent studies have raised concern that fluoroquinolone antibiotics could be associated with an increased risk of aortic aneurysm Fluoroquinolones have non-antimicrobial properties that might jeopardise the integrity of the extracellular matrix of the vascular wall In a  propensity score matched cohort study  in [place], there was a 66% increased rate of aortic aneurysm or dissection associated with oral fluoroquinolone use, compared wit   Statins Nausea And Vomiting    GI symptoms on almost all statins Other reaction(s): GI intolerance (Lipitor, Zocor)      History of Present Illness      83 y.o. y/o male with the above complex past medical history including CAD, hypertension, hyperlipidemia, persistent atrial fibrillation, ascending aortic aneurysm, abdominal aortic aneurysm, and stage III chronic kidney disease. Cardiac history dates back to 2000, when he suffered an MI and underwent PCI and stent placement. Unfortunately, he suffered a non-STEMI in December 2017, and underwent diagnostic catheterization revealing severe mid LAD, OM 3, and RCA disease. He subsequently underwent CABG x3. Stress testing in March 2021, in the setting of chest pain, showed no ischemia or scar, and was deemed low risk.  He was found to be in rate-controlled, asymptomatic Afib in 02/2022, and decision was made to continue rate-control, while eliquis was added, and later switched to warfarin due to cost.  Echo in 02/2022, showed an EF of 50 to 55% with moderately dilated left atrium, and mild MR.  He has been followed closely by CT surgery in the setting of ascending aortic aneurysm.  CT of the chest in March 2024 showed stable 4.5 cm ascending aortic aneurysm.     TodayMr. Fuentes recently developed frank hematuria.  He saw his primary care provider and subsequently underwent CT of the abdomen and pelvis in January 2025, which showed a large, lobulated enhancing endoluminal mass arising from the bladder dome measuring at least 5.1 x 3.6 x 1.8 cm consistent with primary bladder malignancy without evidence of lymphadenopathy or metastatic disease.  3.7 x 3.5 cm infrarenal abdominal aortic aneurysm was also noted.  He has since been seen by urology with plans for TURBT.  He is in good spirits.  From a cardiac standpoint, he has remained  active without symptoms or limitations.  However he denies chest pain, dyspnea, palpitations, PND, orthopnea, dizziness, syncope, edema, or early satiety.  He is now off warfarin and frank hematuria has resolved. Objective  Home Medications    Current Outpatient Medications  Medication Sig Dispense Refill   Cholecalciferol (VITAMIN D3 PO) Take 6,000 Units by mouth. Taking 1 capsule daily     ezetimibe (ZETIA) 10 MG tablet Take 1 tablet (10 mg total) by mouth daily. 30 tablet 1   fenofibrate 160 MG tablet Take 160 mg by mouth.      gabapentin (NEURONTIN) 600 MG tablet Take 600 mg by mouth 3 (three) times daily.     levothyroxine (SYNTHROID) 50 MCG tablet Take 50 mcg by mouth daily.     lisinopril (ZESTRIL) 5 MG tablet Take 1 tablet (5 mg total) by mouth daily. 90 tablet 3   Multiple Vitamins-Minerals (ICAPS AREDS 2 PO) Take by mouth 2 times daily at 12 noon and 4 pm.     Multiple Vitamins-Minerals (ZINC PO) Take by mouth. Taking 2 capsules daily     rosuvastatin (CRESTOR) 5 MG  tablet Take 1 tablet (5 mg total) by mouth daily. 90 tablet 0   sildenafil (REVATIO) 20 MG tablet Take 60-80 mg by mouth as directed.     VITAMIN E PO Take 1 tablet by mouth daily.     No current facility-administered medications for this visit.     Physical Exam    VS:  BP 136/88   Pulse 74   Ht 5' 7.5" (1.715 m)   Wt 154 lb 4 oz (70 kg)   SpO2 93%   BMI 23.80 kg/m  , BMI Body mass index is 23.8 kg/m.     Vitals:   10/13/23 1115 10/13/23 1222  BP: (!) 150/80 136/88  Pulse: 74   SpO2: 93%       GEN: Well nourished, well developed, in no acute distress. HEENT: normal. Neck: Supple, no JVD, carotid bruits, or masses. Cardiac: IR, IR, no murmurs, rubs, or gallops. No clubbing, cyanosis, edema.  Radials 2+/PT 2+ and equal bilaterally.  Respiratory:  Respirations regular and unlabored, clear to auscultation bilaterally. GI: Soft, nontender, nondistended, BS + x 4. MS: no deformity or atrophy. Skin: warm  and dry, no rash. Neuro:  Strength and sensation are intact. Psych: Normal affect.  Accessory Clinical Findings    ECG personally reviewed by me today - EKG Interpretation Date/Time:  Thursday October 13 2023 11:22:04 EST Ventricular Rate:  74 PR Interval:    QRS Duration:  94 QT Interval:  366 QTC Calculation: 406 R Axis:   33  Text Interpretation: Atrial fibrillation Nonspecific ST abnormality Confirmed by Nicolasa Ducking [phone]) on 10/13/2023 11:28:08 AM  - no acute changes.  Lab Results  Component Value Date   WBC 8.9 05/05/2022   HGB 12.6 (L) 05/05/2022   HCT 39.1 05/05/2022   MCV 92.9 05/05/2022   PLT 234 05/05/2022   Lab Results  Component Value Date   CREATININE 1.13 05/05/2022   BUN 25 (H) 05/05/2022   NA 135 05/05/2022   K 4.1 05/05/2022   CL 103 05/05/2022   CO2 24 05/05/2022   Lab Results  Component Value Date   ALT 12 03/18/2022   AST 17 03/18/2022   ALKPHOS 29 (L) 03/18/2022   BILITOT 0.7 03/18/2022   Lab Results  Component Value Date   CHOL 118 03/18/2022   HDL 31 (L) 03/18/2022   LDLCALC 60 03/18/2022   LDLDIRECT 70 03/18/2022   TRIG 133 03/18/2022   CHOLHDL 3.8 03/18/2022    Lab Results  Component Value Date   HGBA1C 6.0 (H) 08/10/2016   Lab Results  Component Value Date   TSH 2.260 03/18/2022       Assessment & Plan    1.  CAD/Preoperative cardiovascular risk assessment: Status post CABG 2017 with low risk Myoview 2021.  He remains active, exercising regularly without chest pain or dyspnea.  He recently developed hematuria and has been found to have a bladder tumor, now pending TURBT.  As noted, he is active without symptoms or limitations.  He is capable of achieving 8 METS such as heavy work around the house.  He is thus low risk for MACE in perioperative period, and may proceed with surgery w/o further ischemic testing.  In the setting of hematuria, he is off of his warfarin.  He should cont rosuvastatin and zetia throughout the  periop period.  2.  Permanent Afib:  Dx 02/2022.  Asymptomatic and rate-controlled in the absence of AVN blocking agents.  Warfarin on hold in setting of hematuria/bladder  tumor.  When feasible in the post-op setting, I would like for him to resume warfarin, if not otherwise contraindicated by surgical/oncology teams.  3.  Primary HTN:  BP initially elevated but improved on f/u eval.  Cont lisinopril.  4.  HL:  Cont statin rx.  5.  CKD III:  followed by pcp.  On lisinopril.  6.  AAA:  Stable @ 3.7 x 3.5 by recent CT.  7.  Asc Aortic aneurysm:  followed by CT surgery.  Due for f/u CT - orders in place.  8.  Disposition:  f/u in 6 mos or sooner if necessary.  Nicolasa Ducking, NP 10/13/2023, 12:28 PM

## 2023-10-13 NOTE — Patient Instructions (Signed)
Medication Instructions:  Your physician recommends that you continue on your current medications as directed. Please refer to the Current Medication list given to you today.   *If you need a refill on your cardiac medications before your next appointment, please call your pharmacy*   Lab Work: No labs ordered today    Testing/Procedures: No test ordered today    Follow-Up: At [facility], you and your health needs are our priority.  As part of our continuing mission to provide you with exceptional heart care, we have created designated Provider Care Teams.  These Care Teams include your primary Cardiologist (physician) and Advanced Practice Providers (APPs -  Physician Assistants and Nurse Practitioners) who all work together to provide you with the care you need, when you need it.  We recommend signing up for the patient portal called "MyChart".  Sign up information is provided on this After Visit Summary.  MyChart is used to connect with patients for Virtual Visits (Telemedicine).  Patients are able to view lab/test results, encounter notes, upcoming appointments, etc.  Non-urgent messages can be sent to your provider as well.   To learn more about what you can do with MyChart, go to [email or web address].    Your next appointment:   6 month(s)  Provider:   Lorine Bears, MD

## 2023-10-17 ENCOUNTER — Other Ambulatory Visit: Payer: Self-pay | Admitting: Cardiovascular Disease

## 2023-10-17 DIAGNOSIS — E785 Hyperlipidemia, unspecified: Secondary | ICD-10-CM

## 2023-10-21 ENCOUNTER — Other Ambulatory Visit: Payer: Self-pay | Admitting: Urology

## 2023-10-24 NOTE — Progress Notes (Signed)
 Sent message, via epic in basket, requesting orders in epic from Careers adviser.

## 2023-10-25 NOTE — Progress Notes (Signed)
 COVID Vaccine Completed: yes  Date of COVID positive in last 90 days:  PCP - Martha Clan, MD Cardiologist - Lorine Bears, MD LOV 10/13/23  Cardiac clearance by Nicolasa Ducking, NP 10/13/23 in Epic   CT- 11/15/22 Epic Chest x-ray -  EKG - 10/13/23 Epic Stress Test - 11/02/19 Epic  ECHO - 03/19/22 Epic Cardiac Cath - 08/20/16 Pacemaker/ICD device last checked: Spinal Cord Stimulator:  Bowel Prep -   Sleep Study -  CPAP -   Fasting Blood Sugar -  Checks Blood Sugar _____ times a day  Last dose of GLP1 agonist-  N/A GLP1 instructions:  Hold 7 days before surgery    Last dose of SGLT-2 inhibitors-  N/A SGLT-2 instructions:  Hold 3 days before surgery    Blood Thinner Instructions:  Last dose:   Time: Aspirin Instructions: Last Dose:  Activity level:  Can go up a flight of stairs and perform activities of daily living without stopping and without symptoms of chest pain or shortness of breath.  Able to exercise without symptoms  Unable to go up a flight of stairs without symptoms of     Anesthesia review: CAD, HTN, NSTEMI, AAA, a fib, idiopathic medial aortopathy, DM, CKD, CABG x4  Patient denies shortness of breath, fever, cough and chest pain at PAT appointment  Patient verbalized understanding of instructions that were given to them at the PAT appointment. Patient was also instructed that they will need to review over the PAT instructions again at home before surgery.

## 2023-10-25 NOTE — Patient Instructions (Signed)
 SURGICAL WAITING ROOM VISITATION  Patients having surgery or a procedure may have no more than 2 support people in the waiting area - these visitors may rotate.    Children under the age of 75 must have an adult with them who is not the patient.  Due to an increase in RSV and influenza rates and associated hospitalizations, children ages 41 and under may not visit patients in [facility] hospitals.  Visitors with respiratory illnesses are discouraged from visiting and should remain at home.  If the patient needs to stay at the hospital during part of their recovery, the visitor guidelines for inpatient rooms apply. Pre-op nurse will coordinate an appropriate time for 1 support person to accompany patient in pre-op.  This support person may not rotate.    Please refer to the [facility] website for the visitor guidelines for Inpatients (after your surgery is over and you are in a regular room).    Your procedure is scheduled on: 11/04/23   Report to [facility] Main Entrance    Report to admitting at 12:15 PM   Call this number if you have problems the morning of surgery [phone]   Do not eat food or drink liquids :After Midnight.          If you have questions, please contact your surgeon's office.   FOLLOW BOWEL PREP AND ANY ADDITIONAL PRE OP INSTRUCTIONS YOU RECEIVED FROM YOUR SURGEON'S OFFICE!!!     Oral Hygiene is also important to reduce your risk of infection.                                    Remember - BRUSH YOUR TEETH THE MORNING OF SURGERY WITH YOUR REGULAR TOOTHPASTE  DENTURES WILL BE REMOVED PRIOR TO SURGERY PLEASE DO NOT APPLY "Poly grip" OR ADHESIVES!!!   Stop all vitamins and herbal supplements 7 days before surgery.   Take these medicines the morning of surgery with A SIP OF WATER: Fenofibrate, Gabapentin, Levothyroxine, Rosuvastatin              You may not have any metal on your body including jewelry, and body piercing             Do not wear  lotions, powders, cologne, or deodorant              Men may shave face and neck.   Do not bring valuables to the hospital. [facility] IS NOT             RESPONSIBLE   FOR VALUABLES.   Contacts, glasses, dentures or bridgework may not be worn into surgery.  DO NOT BRING YOUR HOME MEDICATIONS TO THE HOSPITAL. PHARMACY WILL DISPENSE MEDICATIONS LISTED ON YOUR MEDICATION LIST TO YOU DURING YOUR ADMISSION IN THE HOSPITAL!    Patients discharged on the day of surgery will not be allowed to drive home.  Someone NEEDS to stay with you for the first 24 hours after anesthesia.   Special Instructions: Bring a copy of your healthcare power of attorney and living will documents the day of surgery if you haven't scanned them before.              Please read over the following fact sheets you were given: IF YOU HAVE QUESTIONS ABOUT YOUR PRE-OP INSTRUCTIONS PLEASE CALL [phone] Contras    If you received a COVID test during your pre-op visit  it is requested that you wear a mask when out in public, stay away from anyone that may not be feeling well and notify your surgeon if you develop symptoms. If you test positive for Covid or have been in contact with anyone that has tested positive in the last 10 days please notify you surgeon.    [facility] - Preparing for Surgery Before surgery, you can play an important role.  Because skin is not sterile, your skin needs to be as free of germs as possible.  You can reduce the number of germs on your skin by washing with CHG (chlorahexidine gluconate) soap before surgery.  CHG is an antiseptic cleaner which kills germs and bonds with the skin to continue killing germs even after washing. Please DO NOT use if you have an allergy to CHG or antibacterial soaps.  If your skin becomes reddened/irritated stop using the CHG and inform your nurse when you arrive at Short Stay. Do not shave (including legs and underarms) for at least 48 hours prior to the first CHG  shower.  You may shave your face/neck.  Please follow these instructions carefully:  1.  Shower with CHG Soap the night before surgery and the  morning of surgery.  2.  If you choose to wash your hair, wash your hair first as usual with your normal  shampoo.  3.  After you shampoo, rinse your hair and body thoroughly to remove the shampoo.                             4.  Use CHG as you would any other liquid soap.  You can apply chg directly to the skin and wash.  Gently with a scrungie or clean washcloth.  5.  Apply the CHG Soap to your body ONLY FROM THE NECK DOWN.   Do   not use on face/ open                           Wound or open sores. Avoid contact with eyes, ears mouth and   genitals (private parts).                       Wash face,  Genitals (private parts) with your normal soap.             6.  Wash thoroughly, paying special attention to the area where your    surgery  will be performed.  7.  Thoroughly rinse your body with warm water from the neck down.  8.  DO NOT shower/wash with your normal soap after using and rinsing off the CHG Soap.                9.  Pat yourself dry with a clean towel.            10.  Wear clean pajamas.            11.  Place clean sheets on your bed the night of your first shower and do not  sleep with pets. Day of Surgery : Do not apply any lotions/deodorants the morning of surgery.  Please wear clean clothes to the hospital/surgery center.  FAILURE TO FOLLOW THESE INSTRUCTIONS MAY RESULT IN THE CANCELLATION OF YOUR SURGERY  PATIENT SIGNATURE_________________________________  NURSE SIGNATURE__________________________________  ________________________________________________________________________

## 2023-10-26 ENCOUNTER — Other Ambulatory Visit: Payer: Self-pay

## 2023-10-26 ENCOUNTER — Encounter (HOSPITAL_COMMUNITY)
Admission: RE | Admit: 2023-10-26 | Discharge: 2023-10-26 | Disposition: A | Discharge status: Home / Self Care | Payer: Medicare Other | Source: Ambulatory Visit | Attending: Urology | Admitting: Urology

## 2023-10-26 ENCOUNTER — Encounter (HOSPITAL_COMMUNITY): Payer: Self-pay

## 2023-10-26 VITALS — BP 161/99 | HR 79 | Temp 97.8°F | Resp 16 | Ht 67.5 in | Wt 150.0 lb

## 2023-10-26 DIAGNOSIS — Z87891 Personal history of nicotine dependence: Secondary | ICD-10-CM | POA: Insufficient documentation

## 2023-10-26 DIAGNOSIS — E039 Hypothyroidism, unspecified: Secondary | ICD-10-CM | POA: Insufficient documentation

## 2023-10-26 DIAGNOSIS — I129 Hypertensive chronic kidney disease with stage 1 through stage 4 chronic kidney disease, or unspecified chronic kidney disease: Secondary | ICD-10-CM | POA: Insufficient documentation

## 2023-10-26 DIAGNOSIS — C679 Malignant neoplasm of bladder, unspecified: Secondary | ICD-10-CM | POA: Diagnosis not present

## 2023-10-26 DIAGNOSIS — I252 Old myocardial infarction: Secondary | ICD-10-CM | POA: Insufficient documentation

## 2023-10-26 DIAGNOSIS — N183 Chronic kidney disease, stage 3 unspecified: Secondary | ICD-10-CM | POA: Insufficient documentation

## 2023-10-26 DIAGNOSIS — E1122 Type 2 diabetes mellitus with diabetic chronic kidney disease: Secondary | ICD-10-CM | POA: Insufficient documentation

## 2023-10-26 DIAGNOSIS — I7143 Infrarenal abdominal aortic aneurysm, without rupture: Secondary | ICD-10-CM | POA: Diagnosis not present

## 2023-10-26 DIAGNOSIS — I4819 Other persistent atrial fibrillation: Secondary | ICD-10-CM | POA: Diagnosis not present

## 2023-10-26 DIAGNOSIS — Z01812 Encounter for preprocedural laboratory examination: Secondary | ICD-10-CM | POA: Diagnosis not present

## 2023-10-26 DIAGNOSIS — Z951 Presence of aortocoronary bypass graft: Secondary | ICD-10-CM | POA: Diagnosis not present

## 2023-10-26 DIAGNOSIS — Z8679 Personal history of other diseases of the circulatory system: Secondary | ICD-10-CM | POA: Insufficient documentation

## 2023-10-26 DIAGNOSIS — I739 Peripheral vascular disease, unspecified: Secondary | ICD-10-CM | POA: Insufficient documentation

## 2023-10-26 DIAGNOSIS — E119 Type 2 diabetes mellitus without complications: Secondary | ICD-10-CM

## 2023-10-26 DIAGNOSIS — I2510 Atherosclerotic heart disease of native coronary artery without angina pectoris: Secondary | ICD-10-CM | POA: Diagnosis not present

## 2023-10-26 HISTORY — DX: Other complications of anesthesia, initial encounter: T88.59XA

## 2023-10-26 HISTORY — DX: Cardiac arrhythmia, unspecified: I49.9

## 2023-10-26 HISTORY — DX: Malignant (primary) neoplasm, unspecified: C80.1

## 2023-10-26 LAB — BASIC METABOLIC PANEL
Anion gap: 11 (ref 5–15)
BUN: 26 mg/dL — ABNORMAL HIGH (ref 8–23)
CO2: 25 mmol/L (ref 22–32)
Calcium: 10 mg/dL (ref 8.9–10.3)
Chloride: 104 mmol/L (ref 98–111)
Creatinine, Ser: 1.2 mg/dL (ref 0.61–1.24)
GFR, Estimated: 60 mL/min — ABNORMAL LOW (ref >60)
Glucose, Bld: 103 mg/dL — ABNORMAL HIGH (ref 70–99)
Potassium: 4.8 mmol/L (ref 3.5–5.1)
Sodium: 140 mmol/L (ref 135–145)

## 2023-10-26 LAB — CBC
HCT: 44.2 % (ref 39.0–52.0)
Hemoglobin: 14.2 g/dL (ref 13.0–17.0)
MCH: 31.6 pg (ref 26.0–34.0)
MCHC: 32.1 g/dL (ref 30.0–36.0)
MCV: 98.4 fL (ref 80.0–100.0)
Platelets: 173 10*3/uL (ref 150–400)
RBC: 4.49 MIL/uL (ref 4.22–5.81)
RDW: 13.7 % (ref 11.5–15.5)
WBC: 7.4 10*3/uL (ref 4.0–10.5)
nRBC: 0 % (ref 0.0–0.2)

## 2023-10-27 ENCOUNTER — Encounter (HOSPITAL_COMMUNITY): Payer: Self-pay

## 2023-10-27 NOTE — Progress Notes (Addendum)
 Case: [id number] Date/Time: 11/04/23 1415   Procedure: TRANSURETHRAL RESECTION OF BLADDER TUMOR (TURBT)/BILATERAL RETROGRADE (Bilateral) - 60 MINUTES NEEDED FOR CASE   Anesthesia type: General   Pre-op diagnosis: BLADDER CANCER   Location: [facility] PROC[facility]URE [facility] / [facility]   Surgeons: Loletta Parish., MD       DISCUSSION: Thomas Fuentes is an 83 year old male who presents to PAT prior to surgery above.  Past medical history significant for former smoking, HTN, history of NSTEMI and CAD s/p CABG (2017), TAA, AAA, PVD, persistent A-fib on warfarin, CKD stage III, hypothyroidism, bladder cancer  Patient follows with cardiology for above issues.  CAD is stable.  AAA followed with yearly ultrasounds and has been stable.  He has persistent A-fib and is rate controlled (not on BB or CCB).  Has been off anticoagulation due to hematuria.  Seen on 10/13/2023 for preop clearance.  Cleared for surgery:  "CAD/Preoperative cardiovascular risk assessment: Status post CABG 2017 with low risk Myoview 2021.  He remains active, exercising regularly without chest pain or dyspnea.  He recently developed hematuria and has been found to have a bladder tumor, now pending TURBT.  As noted, he is active without symptoms or limitations.  He is capable of achieving 8 METS such as heavy work around the house.  He is thus low risk for MACE in perioperative period, and may proceed with surgery w/o further ischemic testing.  In the setting of hematuria, he is off of his warfarin.  He should cont rosuvastatin and zetia throughout the periop period."  Patient is followed by CT surgery for TAA.  Last seen on 11/15/2022.  CT chest showed TAA measuring 4.5 cm.  He was advised to follow-up in 1 year for repeat CT chest.   VS: BP (!) 161/99   Pulse 79   Temp 36.6 C (Oral)   Resp 16   Ht 5' 7.5" (1.715 m)   Wt 68 kg   SpO2 99%   BMI 23.15 kg/m   PROVIDERS: Cleatis Polka., MD Cardiology: Lorine Bears, MD CT Surgery:  Lovett Sox, MD  LABS: Labs reviewed: Acceptable for surgery. (all labs ordered are listed, but only abnormal results are displayed)  Labs Reviewed  BASIC METABOLIC PANEL - Abnormal; Notable for the following components:      Result Value   Glucose, Bld 103 (*)    BUN 26 (*)    GFR, Estimated 60 (*)    All other components within normal limits  CBC     IMAGES:  CT abdomen pelvis 09/21/2023:  IMPRESSION: 1. Large, lobulated enhancing endoluminal mass arising from the bladder dome measuring at least 5.1 x 3.6 x 1.8 cm, consistent with primary bladder malignancy. 2. No evidence of lymphadenopathy or metastatic disease in the abdomen or pelvis. 3. Horseshoe kidney. No upper urinary tract mass, calculi or hydronephrosis. 4. Hepatic steatosis. Mildly coarse contour of the liver, suggestive of cirrhosis. 5. Pancolonic diverticulosis without evidence of acute diverticulitis. 6. Infrarenal abdominal aortic aneurysm, maximum caliber 3.7 x 3.5 cm. Recommend follow-up ultrasound every 2 years if not otherwise imaged and if clinically appropriate. 7. Coronary artery disease.  EKG 10/13/2023  Atrial fibrillation, 74 Nonspecific ST abnormality  CV:  Ultrasound AAA 03/17/2023:  Summary: Abdominal Aorta: There is evidence of abnormal dilatation of the distal Abdominal aorta. There is evidence of abnormal dilation of the Right Common Iliac artery. The largest aortic measurement is 3.5 cm. The right distal common iliac artery caliber was not reported on  the previous exam. The largest aortic diameter remains essentially unchanged compared to prior exam. Previous diameter measurement was 3.4 cm obtained on 01/13/22.  Echo 03/19/2022:  IMPRESSIONS    1. Left ventricular ejection fraction, by estimation, is 50 to 55%. The left ventricle has low normal function. The left ventricle has no regional wall motion abnormalities. Left ventricular diastolic parameters  are indeterminate.  2. Right ventricular systolic function is normal. The right ventricular size is normal.  3. Left atrial size was moderately dilated.  4. The mitral valve is normal in structure. Mild mitral valve regurgitation. No evidence of mitral stenosis.  5. The aortic valve is normal in structure. Aortic valve regurgitation is not visualized. Aortic valve sclerosis is present, with no evidence of aortic valve stenosis.  6. There is mild dilatation of the aortic root, measuring 45 mm. There is borderline dilatation of the ascending aorta, measuring 38 mm.  7. The inferior vena cava is normal in size with greater than 50% respiratory variability, suggesting right atrial pressure of 3 mmHg.  Comparison(s): EF 50%, mild hypokinesis, AOR 42mm, asc aor 44mm.  Stress test 11/02/2019:   Narrative & Impression Normal pharmacologic myocardial perfusion stress test without significant ischemia or scar. The left ventricular ejection fraction is normal by visual estimation and Siemens calculation. Attenuation correction CT shows post-CABG findings as well as aortic atherosclerosis and coronary artery calcification. This is a low risk study. Past Medical History:  Diagnosis Date   AAA (abdominal aortic aneurysm) (HCC)    a. 05/2019 U/S: 3.1cm AAA; b. 12/2021 Abd Ao U/S: 3.3 cm AAA.   Ascending aortic aneurysm (HCC)    a. 10/2019 CTA Chest: 4.3cm asc TAA; b. 10/2020 CTA Chest: 4.2cm asc TAA; c. 10/2021 CTA Chest: 4.4cm TAA.   Cancer [facility])    bladder   Chronic renal disease, stage III (HCC)    Complication of anesthesia    slow to wake up post colonoscopy   Coronary artery disease    a. MI 2000 s/p PCI and 2 stent placement to unknown arteries;  b. LHC 08/09/2016 o-pLAD 30%, mLAD 99%, OM2 50%, OM3 85%, pRCA 99%, mid RCA 60%, RPDA 60%, LVEDP mod elevated. CABG in 07/2016 :  LIMA to LAD, SVG to RCA , SVG to LCX; c. 10/2019 MV: No ischemia or scar. Low risk.   Dysrhythmia    a fib   History of  echocardiogram    a. 02/2022 Echo: EF 50-55%, no rwma, nl RV fxn, mod dil LA, mild MR. Ao sclerosis. Ao root 45mm. Asc Ao 38mm.   Hyperlipidemia    Hypertension    Hypothyroidism    Macular degeneration    bilateral   Metabolic syndrome    MI (myocardial infarction) (HCC)    Peripheral neuropathy    Peripheral vascular disease [facility])     Past Surgical History:  Procedure Laterality Date   CARDIAC CATHETERIZATION  08/30/1998   [facility] x2 stents   CARDIAC CATHETERIZATION N/A 08/09/2016   Procedure: Left Heart Cath and Coronary Angiography;  Surgeon: Iran Ouch, MD;  Location: [facility] INVASIVE CV LAB;  Service: Cardiovascular;  Laterality: N/A;   CATARACT EXTRACTION W/PHACO Left 09/07/2022   Procedure: CATARACT EXTRACTION PHACO AND INTRAOCULAR LENS PLACEMENT (IOC) LEFT 9.87 00:56.1;  Surgeon: Galen Manila, MD;  Location: [facility];  Service: Ophthalmology;  Laterality: Left;   CATARACT EXTRACTION W/PHACO Right 09/21/2022   Procedure: CATARACT EXTRACTION PHACO AND INTRAOCULAR LENS PLACEMENT (IOC) RIGHT 7.31 00:48.6;  Surgeon: Galen Manila, MD;  Location: [facility];  Service: Ophthalmology;  Laterality: Right;   COLONOSCOPY     CORONARY ARTERY BYPASS GRAFT N/A 08/11/2016   Procedure: CORONARY ARTERY BYPASS GRAFTING on pump using left internal mammary artery to left anterior descending coronary artery , portion of right greater saphenous vein to right coronary artery, portion of right greater saphenous vein graft to distal circumflex.;  Surgeon: Delight Ovens, MD;  Location: [facility] OR;  Service: Open Heart Surgery;  Laterality: N/A;   CORONARY STENT PLACEMENT     ENDOVEIN HARVEST OF GREATER SAPHENOUS VEIN Right 08/11/2016   Procedure: ENDOVEIN HARVEST OF GREATER SAPHENOUS VEIN;  Surgeon: Delight Ovens, MD;  Location: [facility] OR;  Service: Open Heart Surgery;  Laterality: Right;   TEE WITHOUT CARDIOVERSION N/A 08/11/2016   Procedure: TRANSESOPHAGEAL ECHOCARDIOGRAM  (TEE);  Surgeon: Delight Ovens, MD;  Location: [facility] OR;  Service: Open Heart Surgery;  Laterality: N/A;    MEDICATIONS:  Cholecalciferol (VITAMIN D3) 1000 units CAPS   ezetimibe (ZETIA) 10 MG tablet   fenofibrate 160 MG tablet   gabapentin (NEURONTIN) 600 MG tablet   levothyroxine (SYNTHROID) 50 MCG tablet   lisinopril (ZESTRIL) 5 MG tablet   Multiple Vitamins-Minerals (ICAPS AREDS 2 PO)   Multiple Vitamins-Minerals (ZINC PO)   rosuvastatin (CRESTOR) 5 MG tablet   sildenafil (REVATIO) 20 MG tablet   nitrofurantoin, macrocrystal-monohydrate, (MACROBID) 100 MG capsule   No current facility-administered medications for this encounter.

## 2023-10-27 NOTE — Anesthesia Preprocedure Evaluation (Addendum)
 Anesthesia Evaluation  Patient identified by MRN, date of birth, ID band Patient awake    Reviewed: Allergy & Precautions, NPO status , Patient's Chart, lab work & pertinent test results  History of Anesthesia Complications Negative for: history of anesthetic complications  Airway Mallampati: II  TM Distance: >3 FB Neck ROM: Full    Dental no notable dental hx. (+) Teeth Intact, Dental Advisory Given, Implants   Pulmonary former smoker   Pulmonary exam normal breath sounds clear to auscultation       Cardiovascular hypertension, (-) angina + CAD, + Past MI (NSTEMI), + CABG (2017) and + Peripheral Vascular Disease (AAA)  Normal cardiovascular exam+ dysrhythmias (on coumadin) Atrial Fibrillation  Rhythm:Regular Rate:Normal     Neuro/Psych    GI/Hepatic   Endo/Other  Hypothyroidism    Renal/GU Renal diseaseLab Results      Component                Value               Date                         K                        4.8                 10/26/2023                CO2                      25                  10/26/2023                BUN                      26 (H)              10/26/2023                CREATININE               1.20                10/26/2023                GFRNONAA                 60 (L)              10/26/2023                   Bladder ca    Musculoskeletal negative musculoskeletal ROS (+)    Abdominal   Peds  Hematology Lab Results      Component                Value               Date                      WBC                      7.4                 10/26/2023  HGB                      14.2                10/26/2023                HCT                      44.2                10/26/2023                MCV                      98.4                10/26/2023                PLT                      173                 10/26/2023              Anesthesia Other Findings All quinolones  , procaine, statins  Reproductive/Obstetrics                             Anesthesia Physical Anesthesia Plan  ASA: 3  Anesthesia Plan: General   Post-op Pain Management:    Induction: Intravenous  PONV Risk Score and Plan: Ondansetron, Dexamethasone and Treatment may vary due to age or medical condition  Airway Management Planned: LMA  Additional Equipment: None  Intra-op Plan:   Post-operative Plan:   Informed Consent: I have reviewed the patients History and Physical, chart, labs and discussed the procedure including the risks, benefits and alternatives for the proposed anesthesia with the patient or authorized representative who has indicated his/her understanding and acceptance.     Dental advisory given  Plan Discussed with: CRNA and Surgeon  Anesthesia Plan Comments: (See PAT note from 2/26 by Sherlie Ban PA-C )        Anesthesia Quick Evaluation

## 2023-11-04 ENCOUNTER — Encounter (HOSPITAL_COMMUNITY)
Admission: RE | Disposition: A | Discharge status: Home / Self Care | Payer: Self-pay | Source: Home / Self Care | Attending: Urology

## 2023-11-04 ENCOUNTER — Ambulatory Visit (HOSPITAL_BASED_OUTPATIENT_CLINIC_OR_DEPARTMENT_OTHER): Payer: Self-pay | Admitting: Anesthesiology

## 2023-11-04 ENCOUNTER — Encounter (HOSPITAL_COMMUNITY): Payer: Self-pay | Admitting: Urology

## 2023-11-04 ENCOUNTER — Ambulatory Visit (HOSPITAL_COMMUNITY)

## 2023-11-04 ENCOUNTER — Ambulatory Visit (HOSPITAL_COMMUNITY)
Admission: RE | Admit: 2023-11-04 | Discharge: 2023-11-04 | Disposition: A | Discharge status: Home / Self Care | Payer: Medicare Other | Attending: Urology | Admitting: Urology

## 2023-11-04 ENCOUNTER — Ambulatory Visit (HOSPITAL_COMMUNITY): Payer: Self-pay | Admitting: Medical

## 2023-11-04 DIAGNOSIS — Z951 Presence of aortocoronary bypass graft: Secondary | ICD-10-CM | POA: Insufficient documentation

## 2023-11-04 DIAGNOSIS — Z87891 Personal history of nicotine dependence: Secondary | ICD-10-CM

## 2023-11-04 DIAGNOSIS — I4891 Unspecified atrial fibrillation: Secondary | ICD-10-CM | POA: Diagnosis not present

## 2023-11-04 DIAGNOSIS — I2510 Atherosclerotic heart disease of native coronary artery without angina pectoris: Secondary | ICD-10-CM | POA: Diagnosis not present

## 2023-11-04 DIAGNOSIS — I129 Hypertensive chronic kidney disease with stage 1 through stage 4 chronic kidney disease, or unspecified chronic kidney disease: Secondary | ICD-10-CM | POA: Diagnosis not present

## 2023-11-04 DIAGNOSIS — E1122 Type 2 diabetes mellitus with diabetic chronic kidney disease: Secondary | ICD-10-CM | POA: Diagnosis not present

## 2023-11-04 DIAGNOSIS — C679 Malignant neoplasm of bladder, unspecified: Secondary | ICD-10-CM | POA: Diagnosis not present

## 2023-11-04 DIAGNOSIS — C678 Malignant neoplasm of overlapping sites of bladder: Secondary | ICD-10-CM | POA: Diagnosis not present

## 2023-11-04 DIAGNOSIS — E119 Type 2 diabetes mellitus without complications: Secondary | ICD-10-CM

## 2023-11-04 DIAGNOSIS — E039 Hypothyroidism, unspecified: Secondary | ICD-10-CM | POA: Insufficient documentation

## 2023-11-04 DIAGNOSIS — I739 Peripheral vascular disease, unspecified: Secondary | ICD-10-CM | POA: Diagnosis not present

## 2023-11-04 DIAGNOSIS — Z7901 Long term (current) use of anticoagulants: Secondary | ICD-10-CM | POA: Insufficient documentation

## 2023-11-04 DIAGNOSIS — N3289 Other specified disorders of bladder: Secondary | ICD-10-CM | POA: Diagnosis not present

## 2023-11-04 DIAGNOSIS — N183 Chronic kidney disease, stage 3 unspecified: Secondary | ICD-10-CM | POA: Insufficient documentation

## 2023-11-04 DIAGNOSIS — I10 Essential (primary) hypertension: Secondary | ICD-10-CM | POA: Diagnosis not present

## 2023-11-04 HISTORY — PX: TRANSURETHRAL RESECTION OF BLADDER TUMOR: SHX2575

## 2023-11-04 SURGERY — TURBT (TRANSURETHRAL RESECTION OF BLADDER TUMOR)
Anesthesia: General | Site: Bladder | Laterality: Bilateral

## 2023-11-04 MED ORDER — PHENYLEPHRINE HCL (PRESSORS) 10 MG/ML IV SOLN
INTRAVENOUS | Status: DC | PRN
  Administered 2023-11-04 (×3): 40 ug via INTRAVENOUS
  Administered 2023-11-04: 80 ug via INTRAVENOUS
  Administered 2023-11-04: 40 ug via INTRAVENOUS

## 2023-11-04 MED ORDER — DEXAMETHASONE SODIUM PHOSPHATE 10 MG/ML IJ SOLN
INTRAMUSCULAR | Status: DC | PRN
  Administered 2023-11-04: 8 mg via INTRAVENOUS

## 2023-11-04 MED ORDER — IOHEXOL 300 MG/ML  SOLN
INTRAMUSCULAR | Status: DC | PRN
  Administered 2023-11-04: 20 mL

## 2023-11-04 MED ORDER — DEXMEDETOMIDINE HCL IN NACL 80 MCG/20ML IV SOLN
INTRAVENOUS | Status: DC | PRN
Start: 2023-11-04 — End: 2023-11-04
  Administered 2023-11-04: 4 ug via INTRAVENOUS
  Administered 2023-11-04: 8 ug via INTRAVENOUS

## 2023-11-04 MED ORDER — SILODOSIN 8 MG PO CAPS: 8.0000 mg | ORAL_CAPSULE | Freq: Every day | ORAL | 11 refills | Status: AC | PRN

## 2023-11-04 MED ORDER — ONDANSETRON HCL 4 MG/2ML IJ SOLN: 4.0000 mg | Freq: Once | INTRAMUSCULAR | Status: DC | PRN

## 2023-11-04 MED ORDER — ONDANSETRON HCL 4 MG/2ML IJ SOLN
INTRAMUSCULAR | Status: AC
  Filled 2023-11-04: qty 2

## 2023-11-04 MED ORDER — GENTAMICIN SULFATE 40 MG/ML IJ SOLN
5.0000 mg/kg | INTRAVENOUS | Status: AC
  Administered 2023-11-04: 340 mg via INTRAVENOUS
  Filled 2023-11-04: qty 8.5

## 2023-11-04 MED ORDER — PROPOFOL 10 MG/ML IV BOLUS
INTRAVENOUS | Status: DC | PRN
  Administered 2023-11-04: 120 mg via INTRAVENOUS

## 2023-11-04 MED ORDER — FENTANYL CITRATE (PF) 100 MCG/2ML IJ SOLN
INTRAMUSCULAR | Status: AC
Start: 2023-11-04 — End: ?
  Filled 2023-11-04: qty 2

## 2023-11-04 MED ORDER — PROPOFOL 10 MG/ML IV BOLUS
INTRAVENOUS | Status: AC
  Filled 2023-11-04: qty 20

## 2023-11-04 MED ORDER — SODIUM CHLORIDE 0.9 % IR SOLN
Status: DC | PRN
Start: 2023-11-04 — End: 2023-11-04
  Administered 2023-11-04 (×2): 6000 mL via INTRAVESICAL
  Administered 2023-11-04: 3000 mL via INTRAVESICAL

## 2023-11-04 MED ORDER — LACTATED RINGERS IV SOLN: INTRAVENOUS | Status: DC

## 2023-11-04 MED ORDER — FENTANYL CITRATE PF 50 MCG/ML IJ SOSY: 25.0000 ug | PREFILLED_SYRINGE | INTRAMUSCULAR | Status: DC | PRN

## 2023-11-04 MED ORDER — TRAMADOL HCL 50 MG PO TABS: 50.0000 mg | ORAL_TABLET | Freq: Four times a day (QID) | ORAL | 0 refills | Status: DC | PRN

## 2023-11-04 MED ORDER — FENTANYL CITRATE (PF) 100 MCG/2ML IJ SOLN
INTRAMUSCULAR | Status: DC | PRN
  Administered 2023-11-04 (×2): 25 ug via INTRAVENOUS
  Administered 2023-11-04: 50 ug via INTRAVENOUS

## 2023-11-04 MED ORDER — ACETAMINOPHEN 10 MG/ML IV SOLN: 1000.0000 mg | Freq: Once | INTRAVENOUS | Status: DC | PRN

## 2023-11-04 MED ORDER — ORAL CARE MOUTH RINSE: 15.0000 mL | Freq: Once | OROMUCOSAL | Status: AC

## 2023-11-04 MED ORDER — CHLORHEXIDINE GLUCONATE 0.12 % MT SOLN
15.0000 mL | Freq: Once | OROMUCOSAL | Status: AC
  Administered 2023-11-04: 15 mL via OROMUCOSAL

## 2023-11-04 MED ORDER — PHENYLEPHRINE 80 MCG/ML (10ML) SYRINGE FOR IV PUSH (FOR BLOOD PRESSURE SUPPORT)
PREFILLED_SYRINGE | INTRAVENOUS | Status: AC
  Filled 2023-11-04: qty 10

## 2023-11-04 MED ORDER — ONDANSETRON HCL 4 MG/2ML IJ SOLN
INTRAMUSCULAR | Status: DC | PRN
  Administered 2023-11-04: 4 mg via INTRAVENOUS

## 2023-11-04 SURGICAL SUPPLY — 16 items
BAG URINE DRAIN 2000ML AR STRL (UROLOGICAL SUPPLIES) IMPLANT
BAG URO CATCHER STRL LF (MISCELLANEOUS) ×1 IMPLANT
CATH URETL OPEN END 6FR 70 (CATHETERS) IMPLANT
DRAPE FOOT SWITCH (DRAPES) ×1 IMPLANT
ELECT REM PT RETURN 15FT ADLT (MISCELLANEOUS) ×1 IMPLANT
EVACUATOR MICROVAS BLADDER (UROLOGICAL SUPPLIES) IMPLANT
GLOVE SURG LX STRL 7.5 STRW (GLOVE) ×1 IMPLANT
GOWN STRL REUS W/ TWL XL LVL3 (GOWN DISPOSABLE) ×1 IMPLANT
KIT TURNOVER KIT A (KITS) IMPLANT
LOOP CUT BIPOLAR 24F LRG (ELECTROSURGICAL) IMPLANT
MANIFOLD NEPTUNE II (INSTRUMENTS) ×1 IMPLANT
PACK CYSTO (CUSTOM PROCEDURE TRAY) ×1 IMPLANT
SYR TOOMEY IRRIG 70ML (MISCELLANEOUS) IMPLANT
SYRINGE TOOMEY IRRIG 70ML (MISCELLANEOUS) IMPLANT
TUBING CONNECTING 10 (TUBING) ×1 IMPLANT
TUBING UROLOGY SET (TUBING) ×1 IMPLANT

## 2023-11-04 NOTE — H&P (Signed)
 Thomas Fuentes is an 83 y.o. male.    Chief Complaint: Pre-OP Transurethral Resection of Bladder Tumor  HPI:   1 - Large Bladder Cancer - large bladder mass on CT 08/2023 on eval gross hematuria. NO hydro / upper tract lesions. Remote >20PY smoker. No tissue DX yet.   2 - Horshose Kidneys - incidetnal horshoe kidney on CT 08/2023. NO hydro. Single ureters bilaterally.   PMH sig for CAD/CABG/Stent/AFib/Coumadin (follows M. Arida [facility]), not limiting. No prior abd surgeries. Lives alone with lady partner x years, son Lorin Picket is 3 houses down and very involved. His PCP is Eric Form MD with [facility].   Today "Thomas Fuentes" is seen to proceed with TURBT. No interval fevers. Hematuria improved holding coumadin. Cards clearance on file.    Past Medical History:  Diagnosis Date   AAA (abdominal aortic aneurysm) (HCC)    a. 05/2019 U/S: 3.1cm AAA; b. 12/2021 Abd Ao U/S: 3.3 cm AAA.   Ascending aortic aneurysm (HCC)    a. 10/2019 CTA Chest: 4.3cm asc TAA; b. 10/2020 CTA Chest: 4.2cm asc TAA; c. 10/2021 CTA Chest: 4.4cm TAA.   Cancer [facility])    bladder   Chronic renal disease, stage III (HCC)    Complication of anesthesia    slow to wake up post colonoscopy   Coronary artery disease    a. MI 2000 s/p PCI and 2 stent placement to unknown arteries;  b. LHC 08/09/2016 o-pLAD 30%, mLAD 99%, OM2 50%, OM3 85%, pRCA 99%, mid RCA 60%, RPDA 60%, LVEDP mod elevated. CABG in 07/2016 :  LIMA to LAD, SVG to RCA , SVG to LCX; c. 10/2019 MV: No ischemia or scar. Low risk.   Dysrhythmia    a fib   History of echocardiogram    a. 02/2022 Echo: EF 50-55%, no rwma, nl RV fxn, mod dil LA, mild MR. Ao sclerosis. Ao root 45mm. Asc Ao 38mm.   Hyperlipidemia    Hypertension    Hypothyroidism    Macular degeneration    bilateral   Metabolic syndrome    MI (myocardial infarction) (HCC)    Peripheral neuropathy    Peripheral vascular disease [facility])     Past Surgical History:  Procedure Laterality Date    CARDIAC CATHETERIZATION  08/30/1998   [facility] x2 stents   CARDIAC CATHETERIZATION N/A 08/09/2016   Procedure: Left Heart Cath and Coronary Angiography;  Surgeon: Iran Ouch, MD;  Location: [facility] INVASIVE CV LAB;  Service: Cardiovascular;  Laterality: N/A;   CATARACT EXTRACTION W/PHACO Left 09/07/2022   Procedure: CATARACT EXTRACTION PHACO AND INTRAOCULAR LENS PLACEMENT (IOC) LEFT 9.87 00:56.1;  Surgeon: Galen Manila, MD;  Location: [facility];  Service: Ophthalmology;  Laterality: Left;   CATARACT EXTRACTION W/PHACO Right 09/21/2022   Procedure: CATARACT EXTRACTION PHACO AND INTRAOCULAR LENS PLACEMENT (IOC) RIGHT 7.31 00:48.6;  Surgeon: Galen Manila, MD;  Location: [facility];  Service: Ophthalmology;  Laterality: Right;   COLONOSCOPY     CORONARY ARTERY BYPASS GRAFT N/A 08/11/2016   Procedure: CORONARY ARTERY BYPASS GRAFTING on pump using left internal mammary artery to left anterior descending coronary artery , portion of right greater saphenous vein to right coronary artery, portion of right greater saphenous vein graft to distal circumflex.;  Surgeon: Delight Ovens, MD;  Location: [facility] OR;  Service: Open Heart Surgery;  Laterality: N/A;   CORONARY STENT PLACEMENT     ENDOVEIN HARVEST OF GREATER SAPHENOUS VEIN Right 08/11/2016   Procedure: ENDOVEIN HARVEST OF  GREATER SAPHENOUS VEIN;  Surgeon: Delight Ovens, MD;  Location: [facility] OR;  Service: Open Heart Surgery;  Laterality: Right;   TEE WITHOUT CARDIOVERSION N/A 08/11/2016   Procedure: TRANSESOPHAGEAL ECHOCARDIOGRAM (TEE);  Surgeon: Delight Ovens, MD;  Location: [facility] OR;  Service: Open Heart Surgery;  Laterality: N/A;    Family History  Problem Relation Age of Onset   Hypertension Mother    Prostate cancer Father    Diabetes Sister    Social History:  reports that he quit smoking about 24 years ago. His smoking use included cigarettes. He started smoking about 64 years ago. He has never used smokeless  tobacco. He reports that he does not currently use alcohol. He reports that he does not use drugs.  Allergies:  Allergies  Allergen Reactions   Lidocaine Anaphylaxis and Other (See Comments)    novacaine and lidocaine  Other Reaction(s): small doses ok   Meloxicam Other (See Comments)   Procaine Other (See Comments)   Quinolones     Patient was warned about not using Cipro and similar antibiotics. Recent studies have raised concern that fluoroquinolone antibiotics could be associated with an increased risk of aortic aneurysm Fluoroquinolones have non-antimicrobial properties that might jeopardise the integrity of the extracellular matrix of the vascular wall In a  propensity score matched cohort study in [place], there was a 66% increased rate of aortic aneurysm or dissection associated with oral fluoroquinolone use, compared wit   Statins Nausea And Vomiting    GI symptoms on almost all statins Other reaction(s): GI intolerance (Lipitor, Zocor)    No medications prior to admission.    No results found for this or any previous visit (from the past 48 hours). No results found.  Review of Systems  Constitutional:  Negative for chills and fever.  All other systems reviewed and are negative.   There were no vitals taken for this visit. Physical Exam Vitals reviewed.  HENT:     Head: Normocephalic.  Eyes:     Pupils: Pupils are equal, round, and reactive to light.  Cardiovascular:     Rate and Rhythm: Normal rate.  Pulmonary:     Effort: Pulmonary effort is normal.  Abdominal:     General: Abdomen is flat.  Genitourinary:    Comments: NO CVAT at present Musculoskeletal:        General: Normal range of motion.     Cervical back: Normal range of motion.  Skin:    General: Skin is warm.  Neurological:     General: No focal deficit present.     Mental Status: He is alert.  Psychiatric:        Mood and Affect: Mood normal.      Assessment/Plan  Proceed as planned  with TURBT / Retrogrades. Risks,  benefits, alternatives, expected peri-op course discussed previously and reiterated today.   Loletta Parish., MD 11/04/2023, 7:08 AM

## 2023-11-04 NOTE — Anesthesia Postprocedure Evaluation (Signed)
 Anesthesia Post Note  Patient: Thomas Fuentes  Procedure(s) Performed: TRANSURETHRAL RESECTION OF BLADDER TUMOR (TURBT)/BILATERAL RETROGRADE (Bilateral: Bladder)     Patient location during evaluation: PACU Anesthesia Type: General Level of consciousness: awake and alert Pain management: pain level controlled Vital Signs Assessment: post-procedure vital signs reviewed and stable Respiratory status: spontaneous breathing, nonlabored ventilation, respiratory function stable and patient connected to nasal cannula oxygen Cardiovascular status: blood pressure returned to baseline and stable Postop Assessment: no apparent nausea or vomiting Anesthetic complications: no  No notable events documented.  Last Vitals:  Vitals:   11/04/23 1615 11/04/23 1636  BP: 111/74 (!) 143/99  Pulse: (!) 58 88  Resp: (!) 21   Temp:  (!) 36.3 C  SpO2: 93% 94%    Last Pain:  Vitals:   11/04/23 1636  TempSrc:   PainSc: 0-No pain                 Trevor Iha

## 2023-11-04 NOTE — Transfer of Care (Signed)
 Immediate Anesthesia Transfer of Care Note  Patient: Thomas Fuentes  Procedure(s) Performed: TRANSURETHRAL RESECTION OF BLADDER TUMOR (TURBT)/BILATERAL RETROGRADE (Bilateral: Bladder)  Patient Location: PACU  Anesthesia Type:General  Level of Consciousness: awake, alert , oriented, and patient cooperative  Airway & Oxygen Therapy: Patient Spontanous Breathing and Patient connected to face mask oxygen  Post-op Assessment: Report given to RN and Post -op Vital signs reviewed and stable  Post vital signs: Reviewed and stable  Last Vitals:  Vitals Value Taken Time  BP 106/73 11/04/23 1550  Temp    Pulse 48 11/04/23 1553  Resp 14 11/04/23 1553  SpO2 96 % 11/04/23 1553  Vitals shown include unfiled device data.  Last Pain:  Vitals:   11/04/23 1257  TempSrc:   PainSc: 0-No pain         Complications: No notable events documented.

## 2023-11-04 NOTE — Discharge Instructions (Addendum)
 1 - You may have urinary urgency (bladder spasms) and bloody urine on / off for up to 2 weeks. This is normal.  2 - Continue to hold blood thinners until directed.   3 -Call MD or go to ER for fever >102, severe pain / nausea / vomiting not relieved by medications, or acute change in medical status

## 2023-11-04 NOTE — Brief Op Note (Signed)
 11/04/2023  3:29 PM  PATIENT:  Thomas Fuentes  83 y.o. male  PRE-OPERATIVE DIAGNOSIS:  BLADDER CANCER  POST-OPERATIVE DIAGNOSIS:  BLADDER CANCER  PROCEDURE:  Procedure(s) with comments: TRANSURETHRAL RESECTION OF BLADDER TUMOR (TURBT)/BILATERAL RETROGRADE (Bilateral) - 60 MINUTES NEEDED FOR CASE  SURGEON:  Surgeons and Role:    * Manny, Delbert Phenix., MD - Primary  PHYSICIAN ASSISTANT:   ASSISTANTS: none   ANESTHESIA:   local and general  EBL:  10 mL   BLOOD ADMINISTERED:none  DRAINS: none   LOCAL MEDICATIONS USED:  NONE  SPECIMEN:  Source of Specimen:  1 - bladder tumor; 2 - base of bladder tumor  DISPOSITION OF SPECIMEN:  PATHOLOGY  COUNTS:  YES  TOURNIQUET:  * No tourniquets in log *  DICTATION: .Other Dictation: Dictation Number [id number]  PLAN OF CARE: Discharge to home after PACU  PATIENT DISPOSITION:  PACU - hemodynamically stable.   Delay start of Pharmacological VTE agent (>24hrs) due to surgical blood loss or risk of bleeding: yes

## 2023-11-04 NOTE — Anesthesia Procedure Notes (Signed)
 Procedure Name: LMA Insertion Date/Time: 11/04/2023 2:44 PM  Performed by: Garth Bigness, CRNAPre-anesthesia Checklist: Patient identified, Emergency Drugs available, Suction available and Patient being monitored Patient Re-evaluated:Patient Re-evaluated prior to induction Oxygen Delivery Method: Circle system utilized Preoxygenation: Pre-oxygenation with 100% oxygen Induction Type: IV induction Ventilation: Mask ventilation without difficulty LMA: LMA inserted LMA Size: 4.0 Tube type: Oral Number of attempts: 1 Placement Confirmation: positive ETCO2 and breath sounds checked- equal and bilateral Tube secured with: Tape Dental Injury: Teeth and Oropharynx as per pre-operative assessment

## 2023-11-05 ENCOUNTER — Encounter (HOSPITAL_COMMUNITY): Payer: Self-pay | Admitting: Urology

## 2023-11-05 NOTE — Op Note (Unsigned)
 NAME: Thomas Fuentes, Thomas Fuentes MEDICAL RECORD NO: [id number] ACCOUNT NO: [id number] DATE OF BIRTH: 03/25/1941 FACILITY: [facility] LOCATION: [facility] PHYSICIAN: Sebastian Ache, MD  Operative Report   SURGEON:  Sebastian Ache, MD.  PREOPERATIVE DIAGNOSIS:  Large volume bladder cancer.  PROCEDURE PERFORMED: 1.  Transurethral resection of bladder tumor, volume large. 2.  Bilateral retrograde pyelograms with interpretation.  ESTIMATED BLOOD LOSS:  Nil.  COMPLICATIONS:  None.  SPECIMENS: 1.  Bladder tumor. 2.  Base of bladder tumor.  FINDINGS: 1.  Very large volume high-grade appearing bladder tumor covering at least 50% of the segments of the bladder.  This was 9-10 cm. 2.  Unremarkable retrograde pyelograms consistent with horseshoe kidney.  INDICATION FOR PROCEDURE:  The patient is a pleasant 83 year old man who was found on workup of gross hematuria to have very large volume bladder cancer. He has rate controlled AFib.  He has been on anticoagulation; however, we have held this as he was  bleeding significantly previously. We discussed options including recommended path of transurethral resection for initial diagnostic and therapeutic intent.  He presents to [place] today. He has cardiac clearance on file. Informed consent was obtained and  placed in the medical record.  PROCEDURE IN DETAIL:  The patient being Diallo Ponder verified and procedure being transurethral resection of bladder tumor and retrograde was confirmed.  Procedure timeout was performed.  Intravenous antibiotics administered.  General LMA anesthesia  introduced.  The patient placed into a low lithotomy position.  Sterile field was created, prepped and draped the patient's penis, perineum and proximal thigh using iodine.  Cystourethroscopy was performed using 21-French rigid scope with offset lens.   Inspection of anterior and posterior urethra was unremarkable.  Inspection of the urinary bladder revealed massive volume bladder cancer  involving all quadrants of the bladder. This was at least 9 cm2.  Amazingly, his trigone area and ureteral orifices  were spared from this and was quite fortunate. Attention was directed at bilateral retrograde pyelograms.  The left ureteral orifice was cannulated with 6-French end-hole catheter and a left retrograde pyelogram was obtained.  Left retrograde pyelogram demonstrated single left ureter, single system left kidney.  There was some evidence of medial calices inferiorly consistent with known horseshoe kidney.  Next, right retrograde pyelogram was obtained.   Right retrograde pyelogram demonstrated single right ureter, single system right kidney, but with horseshoe orientation.  No filling defects or narrowing noted.  Cystoscope was exchanged to a 26-French resectoscope sheath with visual obturator and using  the resectoscope loop, very careful resection was performed of multiple foci of high-grade-appearing tumor down to the superficial  fibromuscular stroma of the urinary bladder and resection area was at least 9 cm2.  This did spare the trigone and ureteral orifices.  This  generated innumerable bladder tumor fragments which were irrigated, set aside for permanent pathology.  The highest grade area appeared to be in the right lateral orientation and cold cut biopsy forceps were used to sample the base of this to rule out  muscle-invasive disease. This was performed using cold cut biopsy forceps x3. These fragments were set aside, labeled as base of bladder tumor.  Final inspection revealed excellent hemostasis.  No evidence of any bladder perforation.  We achieved the  goal of surgery today.  It was not felt that catheterization would be warranted.  The bladder was partially emptied per cystoscope.  Procedure was then terminated.  The patient tolerated the procedure well.  No immediate periprocedural complications.   The patient was  taken to post anesthesia care unit in stable condition.  Plan  for discharge home.   NIK D: 11/04/2023 3:33:52 pm T: 11/05/2023 12:55:00 am  JOB: [id number]/ [id number]

## 2023-11-08 LAB — SURGICAL PATHOLOGY

## 2023-11-09 NOTE — Progress Notes (Unsigned)
 [address]       [place] [address]             [id number]        NICOLUS OSE [id number] 1941/01/24  History of Present Illness: Mr. Guillet is an 83 year old male with a past medical history of hypertension, hyperlipidemia, hypothyroidism, peripheral vascular disease, stage III CKD, abdominal aortic aneurysm, history of bladder cancer, coronary artery disease s/p CABG by Dr. Tyrone Sage in 2017, and an ascending aortic aneurysm. Aneurysm was originally noted on CT scan prior to CABG surgery measuring 4.2cm but measured 4.4cm on CT scan in 2019 during workup for epigastric pain. He has undergone annual surveillance scans since 2021 and ascending aortic aneurysm has remained stable at 4.4-4.5cm.  He denies family history of TAA and personal history of connective tissue disorder.  Today he reports ***   Current Outpatient Medications on File Prior to Visit  Medication Sig Dispense Refill   Cholecalciferol (VITAMIN D3) 1000 units CAPS Take 2,000 Units by mouth daily.     ezetimibe (ZETIA) 10 MG tablet Take 1 tablet (10 mg total) by mouth daily. 30 tablet 1   fenofibrate 160 MG tablet Take 160 mg by mouth.      gabapentin (NEURONTIN) 600 MG tablet Take 600 mg by mouth 3 (three) times daily.     levothyroxine (SYNTHROID) 50 MCG tablet Take 50 mcg by mouth daily.     lisinopril (ZESTRIL) 5 MG tablet Take 1 tablet (5 mg total) by mouth daily. 90 tablet 3   Multiple Vitamins-Minerals (ICAPS AREDS 2 PO) Take 1 capsule by mouth 2 times daily at 12 noon and 4 pm.     Multiple Vitamins-Minerals (ZINC PO) Take 2 capsules by mouth daily. Taking 2 capsules daily     nitrofurantoin, macrocrystal-monohydrate, (MACROBID) 100 MG capsule Take 100 mg by mouth 2 (two) times daily. (Patient not taking: Reported on 10/26/2023)     rosuvastatin (CRESTOR) 5 MG tablet Take 1 tablet by mouth once daily 90 tablet 0   sildenafil (REVATIO) 20 MG tablet Take 20 mg by mouth daily as needed  (Erectile Dysfunction).     silodosin (RAPAFLO) 8 MG CAPS capsule Take 1 capsule (8 mg total) by mouth daily as needed. For weak urinary stream 30 capsule 11   traMADol (ULTRAM) 50 MG tablet Take 1 tablet (50 mg total) by mouth every 6 (six) hours as needed for moderate pain (pain score 4-6) or severe pain (pain score 7-10) (post-operatively). 10 tablet 0   No current facility-administered medications on file prior to visit.   Vitals:  Physical Exam General Neuro CV Pulm GI Extremities  CTA Results:      Impression and Plan: Thoracic aortic aneurysm: Mr. Dettmann presents to the clinic with a *** cm ascending aortic aneurysm.  Echocardiogram shows a tricuspid aortic valve without evidence of regurgitation.  We discussed the natural history and and risk factors for growth of ascending aortic aneurysms.  We covered the importance of smoking cessation, tight blood pressure control, refraining from lifting heavy objects, and avoiding fluoroquinolones.  The patient is aware of signs and symptoms of aortic dissection and when to present to the emergency department.  There is no indication for surgical intervention at this time since his aneurysm does not measure 5.5cm or more. We will continue surveillance and a repeat ** was ordered for ***.    Risk Modification:  Statin:  Rosuvastatin  Smoking cessation instruction/counseling given:  {  CHL AMB [facility] SMOKING CESSATION COUNSELING:20758}  Patient was counseled on importance of Blood Pressure Control.  Despite Medical intervention if the patient notices persistently elevated blood pressure readings.  They are instructed to contact their Primary Care Physician  Please avoid use of Fluoroquinolones as this can potentially increase your risk of Aortic Rupture and/or Dissection  Patient educated on signs and symptoms of Aortic Dissection, handout also provided in AVS  Jenny Reichmann, PA-C 11/09/23

## 2023-11-10 ENCOUNTER — Other Ambulatory Visit: Payer: Self-pay | Admitting: Surgery

## 2023-11-10 DIAGNOSIS — I714 Abdominal aortic aneurysm, without rupture, unspecified: Secondary | ICD-10-CM

## 2023-11-10 DIAGNOSIS — I7121 Aneurysm of the ascending aorta, without rupture: Secondary | ICD-10-CM

## 2023-11-15 DIAGNOSIS — C678 Malignant neoplasm of overlapping sites of bladder: Secondary | ICD-10-CM | POA: Diagnosis not present

## 2023-11-15 DIAGNOSIS — R310 Gross hematuria: Secondary | ICD-10-CM | POA: Diagnosis not present

## 2023-11-15 DIAGNOSIS — Q631 Lobulated, fused and horseshoe kidney: Secondary | ICD-10-CM | POA: Diagnosis not present

## 2023-11-16 ENCOUNTER — Other Ambulatory Visit: Payer: PRIVATE HEALTH INSURANCE

## 2023-11-16 ENCOUNTER — Ambulatory Visit: Payer: Medicare Other

## 2023-11-16 ENCOUNTER — Ambulatory Visit
Admission: RE | Admit: 2023-11-16 | Discharge: 2023-11-16 | Disposition: A | Discharge status: Home / Self Care | Source: Ambulatory Visit | Attending: Surgery | Admitting: Surgery

## 2023-11-16 DIAGNOSIS — I7121 Aneurysm of the ascending aorta, without rupture: Secondary | ICD-10-CM | POA: Diagnosis not present

## 2023-11-16 DIAGNOSIS — I714 Abdominal aortic aneurysm, without rupture, unspecified: Secondary | ICD-10-CM | POA: Diagnosis not present

## 2023-11-22 NOTE — Patient Instructions (Signed)

## 2023-11-23 ENCOUNTER — Ambulatory Visit (INDEPENDENT_AMBULATORY_CARE_PROVIDER_SITE_OTHER): Payer: Medicare Other | Admitting: Physician Assistant

## 2023-11-23 ENCOUNTER — Encounter: Payer: Self-pay | Admitting: Physician Assistant

## 2023-11-23 VITALS — BP 133/75 | HR 75 | Resp 18 | Ht 67.0 in | Wt 153.0 lb

## 2023-11-23 DIAGNOSIS — I7121 Aneurysm of the ascending aorta, without rupture: Secondary | ICD-10-CM

## 2023-11-23 DIAGNOSIS — C679 Malignant neoplasm of bladder, unspecified: Secondary | ICD-10-CM | POA: Insufficient documentation

## 2023-11-23 NOTE — Progress Notes (Unsigned)
 [facility] CONSULT NOTE  Patient Care Team: Cleatis Polka., MD as PCP - General (Internal Medicine) Iran Ouch, MD as PCP - Cardiology (Cardiology)  ASSESSMENT & PLAN:  Morgon is a 83 y.o.male with history of hypertension, hyperlipidemia, cirrhosis, hypothyroidism, macular degeneration, peripheral vascular disease, stage III CKD, abdominal aortic aneurysm, coronary artery disease s/p CABG by Dr. Tyrone Sage in 2017, and an ascending aortic aneurysm being [facility] MI[facility].  Patient has multiple comorbidities. He is no a candidate for cytotoxic chemotherapy. Assessment & Plan Urothelial carcinoma of bladder (HCC)   No orders of the defined types were placed in this encounter.   The total time spent in the appointment was {CHL ONC TIME VISIT - OZHYQ:[id number]} encounter with patients including review of chart and various tests results, discussions about plan of care and coordination of care plan   All questions were answered. The patient knows to call the clinic with any problems, questions or concerns. No barriers to learning was detected.  Melven Sartorius, MD 3/26/202510:53 PM  CHIEF COMPLAINTS/PURPOSE OF CONSULTATION:  ***  HISTORY OF PRESENTING ILLNESS:  Zyir Gassert Weier 83 y.o. male is here because of *** I have reviewed his chart and materials related to his cancer extensively and collaborated history with the patient. Summary of oncologic history is as follows: Oncology History   No history exists.    MEDICAL HISTORY:  Past Medical History:  Diagnosis Date   AAA (abdominal aortic aneurysm) (HCC)    a. 05/2019 U/S: 3.1cm AAA; b. 12/2021 Abd Ao U/S: 3.3 cm AAA.   Ascending aortic aneurysm (HCC)    a. 10/2019 CTA Chest: 4.3cm asc TAA; b. 10/2020 CTA Chest: 4.2cm asc TAA; c. 10/2021 CTA Chest: 4.4cm TAA.   Cancer [facility])    bladder   Chronic renal disease, stage III (HCC)    Complication of anesthesia    slow to wake up post  colonoscopy   Coronary artery disease    a. MI 2000 s/p PCI and 2 stent placement to unknown arteries;  b. LHC 08/09/2016 o-pLAD 30%, mLAD 99%, OM2 50%, OM3 85%, pRCA 99%, mid RCA 60%, RPDA 60%, LVEDP mod elevated. CABG in 07/2016 :  LIMA to LAD, SVG to RCA , SVG to LCX; c. 10/2019 MV: No ischemia or scar. Low risk.   Dysrhythmia    a fib   History of echocardiogram    a. 02/2022 Echo: EF 50-55%, no rwma, nl RV fxn, mod dil LA, mild MR. Ao sclerosis. Ao root 45mm. Asc Ao 38mm.   Hyperlipidemia    Hypertension    Hypothyroidism    Macular degeneration    bilateral   Metabolic syndrome    MI (myocardial infarction) (HCC)    Peripheral neuropathy    Peripheral vascular disease (HCC)     SURGICAL HISTORY: Past Surgical History:  Procedure Laterality Date   CARDIAC CATHETERIZATION  08/30/1998   [facility] x2 stents   CARDIAC CATHETERIZATION N/A 08/09/2016   Procedure: Left Heart Cath and Coronary Angiography;  Surgeon: Iran Ouch, MD;  Location: [facility] INVASIVE CV LAB;  Service: Cardiovascular;  Laterality: N/A;   CATARACT EXTRACTION W/PHACO Left 09/07/2022   Procedure: CATARACT EXTRACTION PHACO AND INTRAOCULAR LENS PLACEMENT (IOC) LEFT 9.87 00:56.1;  Surgeon: Galen Manila, MD;  Location: [facility];  Service: Ophthalmology;  Laterality: Left;   CATARACT EXTRACTION W/PHACO Right 09/21/2022   Procedure: CATARACT EXTRACTION PHACO AND INTRAOCULAR LENS PLACEMENT (IOC) RIGHT 7.31 00:48.6;  Surgeon: Galen Manila, MD;  Location: [facility];  Service: Ophthalmology;  Laterality: Right;   COLONOSCOPY     CORONARY ARTERY BYPASS GRAFT N/A 08/11/2016   Procedure: CORONARY ARTERY BYPASS GRAFTING on pump using left internal mammary artery to left anterior descending coronary artery , portion of right greater saphenous vein to right coronary artery, portion of right greater saphenous vein graft to distal circumflex.;  Surgeon: Delight Ovens, MD;  Location: [facility] OR;  Service: Open  Heart Surgery;  Laterality: N/A;   CORONARY STENT PLACEMENT     ENDOVEIN HARVEST OF GREATER SAPHENOUS VEIN Right 08/11/2016   Procedure: ENDOVEIN HARVEST OF GREATER SAPHENOUS VEIN;  Surgeon: Delight Ovens, MD;  Location: [facility] OR;  Service: Open Heart Surgery;  Laterality: Right;   TEE WITHOUT CARDIOVERSION N/A 08/11/2016   Procedure: TRANSESOPHAGEAL ECHOCARDIOGRAM (TEE);  Surgeon: Delight Ovens, MD;  Location: [facility] OR;  Service: Open Heart Surgery;  Laterality: N/A;   TRANSURETHRAL RESECTION OF BLADDER TUMOR Bilateral 11/04/2023   Procedure: TRANSURETHRAL RESECTION OF BLADDER TUMOR (TURBT)/BILATERAL RETROGRADE;  Surgeon: Loletta Parish., MD;  Location: [facility];  Service: Urology;  Laterality: Bilateral;  60 MINUTES NEEDED FOR CASE    SOCIAL HISTORY: Social History   Socioeconomic History   Marital status: Widowed    Spouse name: Not on file   Number of children: Not on file   Years of education: Not on file   Highest education level: Not on file  Occupational History   Not on file  Tobacco Use   Smoking status: Former    Current packs/day: 0.00    Types: Cigarettes    Start date: 02/06/1959    Quit date: 02/06/1999    Years since quitting: 24.8   Smokeless tobacco: Never  Vaping Use   Vaping status: Never Used  Substance and Sexual Activity   Alcohol use: Not Currently    Comment: rare   Drug use: No   Sexual activity: Not on file  Other Topics Concern   Not on file  Social History Narrative   Not on file   Social Drivers of Health   Financial Resource Strain: Not on file  Food Insecurity: Not on file  Transportation Needs: Not on file  Physical Activity: Not on file  Stress: Not on file  Social Connections: Not on file  Intimate Partner Violence: Not on file    FAMILY HISTORY: Family History  Problem Relation Age of Onset   Hypertension Mother    Prostate cancer Father    Diabetes Sister     ALLERGIES:  is allergic to lidocaine, meloxicam, procaine,  quinolones, and statins.  MEDICATIONS:  Current Outpatient Medications  Medication Sig Dispense Refill   Cholecalciferol (VITAMIN D3) 1000 units CAPS Take 2,000 Units by mouth daily.     ezetimibe (ZETIA) 10 MG tablet Take 1 tablet (10 mg total) by mouth daily. 30 tablet 1   fenofibrate 160 MG tablet Take 160 mg by mouth.      gabapentin (NEURONTIN) 600 MG tablet Take 600 mg by mouth 3 (three) times daily.     levothyroxine (SYNTHROID) 50 MCG tablet Take 50 mcg by mouth daily.     lisinopril (ZESTRIL) 5 MG tablet Take 1 tablet (5 mg total) by mouth daily. 90 tablet 3   Multiple Vitamins-Minerals (ICAPS AREDS 2 PO) Take 1 capsule by mouth 2 times daily at 12 noon and 4 pm.     Multiple Vitamins-Minerals (ZINC PO) Take 2 capsules by mouth  daily. Taking 2 capsules daily     nitrofurantoin, macrocrystal-monohydrate, (MACROBID) 100 MG capsule Take 100 mg by mouth 2 (two) times daily.     rosuvastatin (CRESTOR) 5 MG tablet Take 1 tablet by mouth once daily 90 tablet 0   sildenafil (REVATIO) 20 MG tablet Take 20 mg by mouth daily as needed (Erectile Dysfunction).     silodosin (RAPAFLO) 8 MG CAPS capsule Take 1 capsule (8 mg total) by mouth daily as needed. For weak urinary stream 30 capsule 11   traMADol (ULTRAM) 50 MG tablet Take 1 tablet (50 mg total) by mouth every 6 (six) hours as needed for moderate pain (pain score 4-6) or severe pain (pain score 7-10) (post-operatively). 10 tablet 0   No current facility-administered medications for this visit.    REVIEW OF SYSTEMS:   All relevant systems were reviewed with the patient and are negative.  PHYSICAL EXAMINATION: ECOG PERFORMANCE STATUS: {CHL ONC ECOG [phone]}  There were no vitals filed for this visit. There were no vitals filed for this visit.  GENERAL: alert, no distress and comfortable SKIN: skin color is normal, no jaundice, rashes or significant lesions EYES: sclera clear OROPHARYNX: no exudate, no erythema NECK:  supple LYMPH:  no palpable lymphadenopathy in the cervical, axillary regions LUNGS: Effort normal, no respiratory distress.  Clear to auscultation bilaterally HEART: regular rate & rhythm and no lower extremity edema ABDOMEN: soft, non-tender and nondistended Musculoskeletal: no point tenderness NEURO: no focal motor/sensory deficits  LABORATORY DATA:  I have reviewed the data as listed Lab Results  Component Value Date   WBC 7.4 10/26/2023   HGB 14.2 10/26/2023   HCT 44.2 10/26/2023   MCV 98.4 10/26/2023   PLT 173 10/26/2023   Recent Labs    10/26/23 0848  NA 140  K 4.8  CL 104  CO2 25  GLUCOSE 103*  BUN 26*  CREATININE 1.20  CALCIUM 10.0  GFRNONAA 60*    RADIOGRAPHIC STUDIES: I have personally reviewed the radiological images as listed and agreed with the findings in the report. CT CHEST WO CONTRAST Result Date: 11/23/2023 CLINICAL DATA:  Follow-up ascending thoracic aortic aneurysm. Prior CABG. EXAM: CT CHEST WITHOUT CONTRAST TECHNIQUE: Multidetector CT imaging of the chest was performed following the standard protocol without IV contrast. RADIATION DOSE REDUCTION: This exam was performed according to the departmental dose-optimization program which includes automated exposure control, adjustment of the mA and/or kV according to patient size and/or use of iterative reconstruction technique. COMPARISON:  11/15/2022 FINDINGS: Cardiovascular: Matching the orientation of measurement on comparison exam, the ascending thoracic aorta measures 45 mm at the level the pulmonary outflow tract compared to 45 mm on prior (image 49/series 5). Post CABG anatomy. Mediastinum/Nodes: No axillary or supraclavicular adenopathy. No mediastinal or hilar adenopathy. No pericardial fluid. Esophagus normal. Lungs/Pleura: No suspicious pulmonary nodules. Normal pleural. Airways normal. Upper Abdomen: Limited view of the liver, kidneys, pancreas are unremarkable. Normal adrenal glands. Musculoskeletal: No  aggressive osseous lesion. IMPRESSION: 1. Stable ascending thoracic aortic aneurysm measuring 45 mm. Recommend annual imaging follow-up by CTA or MRA. 2. Post CABG anatomy. 3. No acute findings in the chest. Electronically Signed   By: Genevive Bi M.D.   On: 11/23/2023 13:58   DG C-Arm 1-60 Min-No Report Result Date: 11/04/2023 Fluoroscopy was utilized by the requesting physician.  No radiographic interpretation.

## 2023-11-24 ENCOUNTER — Inpatient Hospital Stay

## 2023-11-24 ENCOUNTER — Other Ambulatory Visit: Payer: Self-pay

## 2023-11-24 VITALS — BP 122/66 | HR 76 | Temp 97.6°F | Resp 18 | Ht 67.0 in | Wt 153.0 lb

## 2023-11-24 DIAGNOSIS — N183 Chronic kidney disease, stage 3 unspecified: Secondary | ICD-10-CM | POA: Diagnosis not present

## 2023-11-24 DIAGNOSIS — I2510 Atherosclerotic heart disease of native coronary artery without angina pectoris: Secondary | ICD-10-CM | POA: Diagnosis not present

## 2023-11-24 DIAGNOSIS — Z87891 Personal history of nicotine dependence: Secondary | ICD-10-CM | POA: Insufficient documentation

## 2023-11-24 DIAGNOSIS — Z7901 Long term (current) use of anticoagulants: Secondary | ICD-10-CM | POA: Diagnosis not present

## 2023-11-24 DIAGNOSIS — C7989 Secondary malignant neoplasm of other specified sites: Secondary | ICD-10-CM | POA: Insufficient documentation

## 2023-11-24 DIAGNOSIS — Z79899 Other long term (current) drug therapy: Secondary | ICD-10-CM | POA: Diagnosis not present

## 2023-11-24 DIAGNOSIS — I129 Hypertensive chronic kidney disease with stage 1 through stage 4 chronic kidney disease, or unspecified chronic kidney disease: Secondary | ICD-10-CM | POA: Diagnosis not present

## 2023-11-24 DIAGNOSIS — C679 Malignant neoplasm of bladder, unspecified: Secondary | ICD-10-CM | POA: Diagnosis not present

## 2023-12-05 DIAGNOSIS — H353222 Exudative age-related macular degeneration, left eye, with inactive choroidal neovascularization: Secondary | ICD-10-CM | POA: Diagnosis not present

## 2023-12-05 DIAGNOSIS — H353212 Exudative age-related macular degeneration, right eye, with inactive choroidal neovascularization: Secondary | ICD-10-CM | POA: Diagnosis not present

## 2023-12-05 DIAGNOSIS — H43813 Vitreous degeneration, bilateral: Secondary | ICD-10-CM | POA: Diagnosis not present

## 2023-12-22 DIAGNOSIS — M8589 Other specified disorders of bone density and structure, multiple sites: Secondary | ICD-10-CM | POA: Diagnosis not present

## 2023-12-22 DIAGNOSIS — R7301 Impaired fasting glucose: Secondary | ICD-10-CM | POA: Diagnosis not present

## 2023-12-22 DIAGNOSIS — E785 Hyperlipidemia, unspecified: Secondary | ICD-10-CM | POA: Diagnosis not present

## 2023-12-22 DIAGNOSIS — I10 Essential (primary) hypertension: Secondary | ICD-10-CM | POA: Diagnosis not present

## 2023-12-22 DIAGNOSIS — Z125 Encounter for screening for malignant neoplasm of prostate: Secondary | ICD-10-CM | POA: Diagnosis not present

## 2023-12-22 DIAGNOSIS — N1831 Chronic kidney disease, stage 3a: Secondary | ICD-10-CM | POA: Diagnosis not present

## 2023-12-22 DIAGNOSIS — E039 Hypothyroidism, unspecified: Secondary | ICD-10-CM | POA: Diagnosis not present

## 2023-12-27 DIAGNOSIS — R310 Gross hematuria: Secondary | ICD-10-CM | POA: Diagnosis not present

## 2023-12-27 DIAGNOSIS — C678 Malignant neoplasm of overlapping sites of bladder: Secondary | ICD-10-CM | POA: Diagnosis not present

## 2023-12-27 DIAGNOSIS — Q631 Lobulated, fused and horseshoe kidney: Secondary | ICD-10-CM | POA: Diagnosis not present

## 2023-12-29 ENCOUNTER — Telehealth: Payer: Self-pay | Admitting: *Deleted

## 2023-12-29 ENCOUNTER — Telehealth: Payer: Self-pay | Admitting: Cardiovascular Disease

## 2023-12-29 ENCOUNTER — Other Ambulatory Visit: Payer: Self-pay | Admitting: Urology

## 2023-12-29 DIAGNOSIS — I480 Paroxysmal atrial fibrillation: Secondary | ICD-10-CM | POA: Diagnosis not present

## 2023-12-29 DIAGNOSIS — I7121 Aneurysm of the ascending aorta, without rupture: Secondary | ICD-10-CM | POA: Diagnosis not present

## 2023-12-29 DIAGNOSIS — Z1331 Encounter for screening for depression: Secondary | ICD-10-CM | POA: Diagnosis not present

## 2023-12-29 DIAGNOSIS — I739 Peripheral vascular disease, unspecified: Secondary | ICD-10-CM | POA: Diagnosis not present

## 2023-12-29 DIAGNOSIS — R82998 Other abnormal findings in urine: Secondary | ICD-10-CM | POA: Diagnosis not present

## 2023-12-29 DIAGNOSIS — M8580 Other specified disorders of bone density and structure, unspecified site: Secondary | ICD-10-CM | POA: Diagnosis not present

## 2023-12-29 DIAGNOSIS — E8881 Metabolic syndrome: Secondary | ICD-10-CM | POA: Diagnosis not present

## 2023-12-29 DIAGNOSIS — I129 Hypertensive chronic kidney disease with stage 1 through stage 4 chronic kidney disease, or unspecified chronic kidney disease: Secondary | ICD-10-CM | POA: Diagnosis not present

## 2023-12-29 DIAGNOSIS — D6869 Other thrombophilia: Secondary | ICD-10-CM | POA: Diagnosis not present

## 2023-12-29 DIAGNOSIS — J439 Emphysema, unspecified: Secondary | ICD-10-CM | POA: Diagnosis not present

## 2023-12-29 DIAGNOSIS — C679 Malignant neoplasm of bladder, unspecified: Secondary | ICD-10-CM | POA: Diagnosis not present

## 2023-12-29 DIAGNOSIS — I25810 Atherosclerosis of coronary artery bypass graft(s) without angina pectoris: Secondary | ICD-10-CM | POA: Diagnosis not present

## 2023-12-29 DIAGNOSIS — H353210 Exudative age-related macular degeneration, right eye, stage unspecified: Secondary | ICD-10-CM | POA: Diagnosis not present

## 2023-12-29 DIAGNOSIS — Z0000 Encounter for general adult medical examination without abnormal findings: Secondary | ICD-10-CM | POA: Diagnosis not present

## 2023-12-29 DIAGNOSIS — N1831 Chronic kidney disease, stage 3a: Secondary | ICD-10-CM | POA: Diagnosis not present

## 2023-12-29 DIAGNOSIS — Z1339 Encounter for screening examination for other mental health and behavioral disorders: Secondary | ICD-10-CM | POA: Diagnosis not present

## 2023-12-29 NOTE — Telephone Encounter (Signed)
   Pre-operative Risk Assessment    Patient Name: Thomas Fuentes  DOB: Jun 14, 1941 MRN: [id number]   Date of last office visit: 10/13/2023 Date of next office visit: TBD   Request for Surgical Clearance    Procedure:   Transurethral resection of bladder tumor and bilateral recto grade    Date of Surgery:  Clearance 01/13/24                                Surgeon:  Dr. Floreen Hunger Group or Practice Name:  [facility] Phone number:  [phone] Fax number:  [phone]   Type of Clearance Requested:   TBD   Type of Anesthesia:  General    Additional requests/questions:      SignedCaryn Clause   12/29/2023, 11:17 AM

## 2023-12-29 NOTE — Telephone Encounter (Signed)
 I s/w the pt and he has been scheduled tele preop appt 01/06/24 due to procedure date and med hold for Warfarin. I asked the pt about Warfarin as I did not see it on his med list anylonger. Pt tells me he was holding for a procedure previously and then Dr. Secundino Dach advised him he said about 3-4 weeks ago to go back on Warfarin. Pt said he has done so and has gone back to the last dosage on file. Pt said he has a hard time to come get INR checked due to transportation. Pt tells me the surgeon told him to hold Warfarin 5-7 days prior. I will update all parties involved.   Med rec and consent are done.      Patient Consent for Virtual Visit        Thomas Fuentes has provided verbal consent on 12/29/2023 for a virtual visit (video or telephone).   CONSENT FOR VIRTUAL VISIT FOR:  Thomas Fuentes  By participating in this virtual visit I agree to the following:  I hereby voluntarily request, consent and authorize [facility] and its employed or contracted physicians, physician assistants, nurse practitioners or other licensed health care professionals (the Practitioner), to provide me with telemedicine health care services (the "Services") as deemed necessary by the treating Practitioner. I acknowledge and consent to receive the Services by the Practitioner via telemedicine. I understand that the telemedicine visit will involve communicating with the Practitioner through live audiovisual communication technology and the disclosure of certain medical information by electronic transmission. I acknowledge that I have been given the opportunity to request an in-person assessment or other available alternative prior to the telemedicine visit and am voluntarily participating in the telemedicine visit.  I understand that I have the right to withhold or withdraw my consent to the use of telemedicine in the course of my care at any time, without affecting my right to future care or treatment, and that the  Practitioner or I may terminate the telemedicine visit at any time. I understand that I have the right to inspect all information obtained and/or recorded in the course of the telemedicine visit and may receive copies of available information for a reasonable fee.  I understand that some of the potential risks of receiving the Services via telemedicine include:  Delay or interruption in medical evaluation due to technological equipment failure or disruption; Information transmitted may not be sufficient (e.g. poor resolution of images) to allow for appropriate medical decision making by the Practitioner; and/or  In rare instances, security protocols could fail, causing a breach of personal health information.  Furthermore, I acknowledge that it is my responsibility to provide information about my medical history, conditions and care that is complete and accurate to the best of my ability. I acknowledge that Practitioner's advice, recommendations, and/or decision may be based on factors not within their control, such as incomplete or inaccurate data provided by me or distortions of diagnostic images or specimens that may result from electronic transmissions. I understand that the practice of medicine is not an exact science and that Practitioner makes no warranties or guarantees regarding treatment outcomes. I acknowledge that a copy of this consent can be made available to me via my patient portal [facility] MyChart), or I can request a printed copy by calling the office of [facility].    I understand that my insurance will be billed for this visit.   I have read or had this consent read  to me. I understand the contents of this consent, which adequately explains the benefits and risks of the Services being provided via telemedicine.  I have been provided ample opportunity to ask questions regarding this consent and the Services and have had my questions answered to my satisfaction. I give my  informed consent for the services to be provided through the use of telemedicine in my medical care

## 2023-12-29 NOTE — Telephone Encounter (Signed)
 I s/w the pt and he has been scheduled tele preop appt 01/06/24 due to procedure date and med hold for Warfarin. I asked the pt about Warfarin as I did not see it on his med list anylonger. Pt tells me he was holding for a procedure previously and then Dr. Secundino Dach advised him he said about 3-4 weeks ago to go back on Warfarin. Pt said he has done so and has gone back to the last dosage on file. Pt said he has a hard time to come get INR checked due to transportation. Pt tells me the surgeon told him to hold Warfarin 5-7 days prior. I will update all parties involved.   Med rec and consent are done.

## 2023-12-29 NOTE — Telephone Encounter (Signed)
   Name: Thomas Fuentes  DOB: [birth year]  MRN: [id number]  Primary Cardiologist: Antionette Kirks, MD Last OV: 10/13/23   Preoperative team, please contact this patient and set up a phone call appointment for further preoperative risk assessment. Please obtain consent and complete medication review. Thank you for your help.  I confirm that guidance regarding antiplatelet and oral anticoagulation therapy has been completed and, if necessary, noted below.  Patient not listed as taking an anticoagulant or antiplatelet medication.  I also confirmed the patient resides in the state of [place] . As per [facility] Medical Board telemedicine laws, the patient must reside in the state in which the provider is licensed.  Khaya Theissen D Reannon Candella, NP 12/29/2023, 12:09 PM [facility]

## 2024-01-02 NOTE — Telephone Encounter (Signed)
 Patient with diagnosis of afib on warfarin for anticoagulation.    Procedure:  Transurethral resection of bladder tumor and bilateral recto grade  Date of procedure: 01/13/24   CHA2DS2-VASc Score = 5   This indicates a 7.2% annual risk of stroke. The patient's score is based upon: CHF History: 0 HTN History: 1 Diabetes History: 1 Stroke History: 0 Vascular Disease History: 1 Age Score: 2 Gender Score: 0      Patient has not had an Afib/aflutter ablation within the last 3 months or DCCV within the last 30 days  Per office protocol, patient can hold warfarin for 5 days prior to procedure.    Patient will NOT need bridging with Lovenox  (enoxaparin ) around procedure.  **This guidance is not considered finalized until pre-operative APP has relayed final recommendations.**

## 2024-01-06 ENCOUNTER — Ambulatory Visit: Attending: Cardiology | Admitting: Student

## 2024-01-06 DIAGNOSIS — Z01810 Encounter for preprocedural cardiovascular examination: Secondary | ICD-10-CM | POA: Diagnosis not present

## 2024-01-06 NOTE — Progress Notes (Signed)
 Virtual Visit via Telephone Note   Because of Briyon A Prindle's co-morbid illnesses, he is at least at moderate risk for complications without adequate follow up.  This format is felt to be most appropriate for this patient at this time.  The patient did not have access to video technology/had technical difficulties with video requiring transitioning to audio format only (telephone).  All issues noted in this document were discussed and addressed.  No physical exam could be performed with this format.  Please refer to the patient's chart for his consent to telehealth for [facility].  Evaluation Performed:  Preoperative cardiovascular risk assessment _____________   Date:  01/06/2024   Patient ID:  Thomas Fuentes, DOB 1941/03/02, MRN [id number] Patient Location:  Home Provider location:   Office  Primary Care Provider:  Jeannine Milroy., MD Primary Cardiologist:  Antionette Kirks, MD  Chief Complaint / Patient Profile   83 y.o. y/o male with a h/o CAD s/p CABG x 02 August 2016, permanent A-fib on anticoagulation, AAA, ascending aortic aneurysm, hypertension, hyperlipidemia, hypothyroidism, T2DM, CKD stage III who is pending transurethral resection of bladder tumor by Dr. Secundino Dach on 01/13/2024 and presents today for telephonic preoperative cardiovascular risk assessment.  History of Present Illness    Thomas Fuentes is a 83 y.o. male who presents via audio/video conferencing for a telehealth visit today.  Pt was last seen in cardiology clinic on 10/13/2023 by Odessa Bene, NP.  At that time SOLMON BOLASH was stable from a cardiac standpoint.  The patient is now pending procedure as outlined above. Since his last visit, he is doing very well. Patient denies shortness of breath, dyspnea on exertion, lower extremity edema, orthopnea or PND. No chest pain, pressure, or tightness. No palpitations.  He is very active at home performing moderate to heavy yard work and other household  activities. He was getting ready to clean up the limbs of a tree he cut down yesterday.   Past Medical History    Past Medical History:  Diagnosis Date   AAA (abdominal aortic aneurysm) (HCC)    a. 05/2019 U/S: 3.1cm AAA; b. 12/2021 Abd Ao U/S: 3.3 cm AAA.   Ascending aortic aneurysm (HCC)    a. 10/2019 CTA Chest: 4.3cm asc TAA; b. 10/2020 CTA Chest: 4.2cm asc TAA; c. 10/2021 CTA Chest: 4.4cm TAA.   Cancer [facility])    bladder   Chronic renal disease, stage III (HCC)    Complication of anesthesia    slow to wake up post colonoscopy   Coronary artery disease    a. MI 2000 s/p PCI and 2 stent placement to unknown arteries;  b. LHC 08/09/2016 o-pLAD 30%, mLAD 99%, OM2 50%, OM3 85%, pRCA 99%, mid RCA 60%, RPDA 60%, LVEDP mod elevated. CABG in 07/2016 :  LIMA to LAD, SVG to RCA , SVG to LCX; c. 10/2019 MV: No ischemia or scar. Low risk.   Dysrhythmia    a fib   History of echocardiogram    a. 02/2022 Echo: EF 50-55%, no rwma, nl RV fxn, mod dil LA, mild MR. Ao sclerosis. Ao root 45mm. Asc Ao 38mm.   Hyperlipidemia    Hypertension    Hypothyroidism    Macular degeneration    bilateral   Metabolic syndrome    MI (myocardial infarction) (HCC)    Peripheral neuropathy    Peripheral vascular disease [facility])    Past Surgical History:  Procedure Laterality Date   CARDIAC CATHETERIZATION  08/30/1998  [facility] x2 stents   CARDIAC CATHETERIZATION N/A 08/09/2016   Procedure: Left Heart Cath and Coronary Angiography;  Surgeon: Wenona Hamilton, MD;  Location: [facility] INVASIVE CV LAB;  Service: Cardiovascular;  Laterality: N/A;   CATARACT EXTRACTION W/PHACO Left 09/07/2022   Procedure: CATARACT EXTRACTION PHACO AND INTRAOCULAR LENS PLACEMENT (IOC) LEFT 9.87 00:56.1;  Surgeon: Clair Crews, MD;  Location: [facility];  Service: Ophthalmology;  Laterality: Left;   CATARACT EXTRACTION W/PHACO Right 09/21/2022   Procedure: CATARACT EXTRACTION PHACO AND INTRAOCULAR LENS PLACEMENT (IOC) RIGHT 7.31 00:48.6;   Surgeon: Clair Crews, MD;  Location: [facility];  Service: Ophthalmology;  Laterality: Right;   COLONOSCOPY     CORONARY ARTERY BYPASS GRAFT N/A 08/11/2016   Procedure: CORONARY ARTERY BYPASS GRAFTING on pump using left internal mammary artery to left anterior descending coronary artery , portion of right greater saphenous vein to right coronary artery, portion of right greater saphenous vein graft to distal circumflex.;  Surgeon: Norita Beauvais, MD;  Location: [facility] OR;  Service: Open Heart Surgery;  Laterality: N/A;   CORONARY STENT PLACEMENT     ENDOVEIN HARVEST OF GREATER SAPHENOUS VEIN Right 08/11/2016   Procedure: ENDOVEIN HARVEST OF GREATER SAPHENOUS VEIN;  Surgeon: Norita Beauvais, MD;  Location: [facility] OR;  Service: Open Heart Surgery;  Laterality: Right;   TEE WITHOUT CARDIOVERSION N/A 08/11/2016   Procedure: TRANSESOPHAGEAL ECHOCARDIOGRAM (TEE);  Surgeon: Norita Beauvais, MD;  Location: [facility] OR;  Service: Open Heart Surgery;  Laterality: N/A;   TRANSURETHRAL RESECTION OF BLADDER TUMOR Bilateral 11/04/2023   Procedure: TRANSURETHRAL RESECTION OF BLADDER TUMOR (TURBT)/BILATERAL RETROGRADE;  Surgeon: Melody Spurling., MD;  Location: [facility] ORS;  Service: Urology;  Laterality: Bilateral;  60 MINUTES NEEDED FOR CASE    Allergies  Allergies  Allergen Reactions   Lidocaine  Anaphylaxis and Other (See Comments)    novacaine and lidocaine   Other Reaction(s): small doses ok   Meloxicam Other (See Comments)   Procaine Other (See Comments)   Quinolones     Patient was warned about not using Cipro and similar antibiotics. Recent studies have raised concern that fluoroquinolone antibiotics could be associated with an increased risk of aortic aneurysm Fluoroquinolones have non-antimicrobial properties that might jeopardise the integrity of the extracellular matrix of the vascular wall In a  propensity score matched cohort study in [place], there was a 66% increased rate of aortic  aneurysm or dissection associated with oral fluoroquinolone use, compared wit   Statins Nausea And Vomiting    GI symptoms on almost all statins Other reaction(s): GI intolerance (Lipitor, Zocor)    Home Medications    Prior to Admission medications   Medication Sig Start Date End Date Taking? Authorizing Provider  Cholecalciferol (VITAMIN D3) 1000 units CAPS Take 2,000 Units by mouth daily.    [provider]  ezetimibe  (ZETIA ) 10 MG tablet Take 1 tablet (10 mg total) by mouth daily. 08/16/16   Gold, Wayne E, PA-C  fenofibrate  160 MG tablet Take 160 mg by mouth.  02/02/17   [provider]  gabapentin  (NEURONTIN ) 600 MG tablet Take 600 mg by mouth 3 (three) times daily.    [provider]  levothyroxine  (SYNTHROID ) 50 MCG tablet Take 50 mcg by mouth daily. 10/24/19   [provider]  lisinopril  (ZESTRIL ) 5 MG tablet Take 1 tablet (5 mg total) by mouth daily. 04/06/23   Florette Hurry, NP  Multiple Vitamins-Minerals (ICAPS AREDS 2 PO) Take 1 capsule by mouth 2 times daily  at 12 noon and 4 pm.    [provider]  Multiple Vitamins-Minerals (ZINC PO) Take 2 capsules by mouth daily. Taking 2 capsules daily    [provider]  rosuvastatin  (CRESTOR ) 5 MG tablet Take 1 tablet by mouth once daily 10/17/23   Arida, Muhammad A, MD  sildenafil (REVATIO) 20 MG tablet Take 20 mg by mouth daily as needed (Erectile Dysfunction). 08/10/19   [provider]  silodosin  (RAPAFLO ) 8 MG CAPS capsule Take 1 capsule (8 mg total) by mouth daily as needed. For weak urinary stream 11/04/23   Manny, Harvey Linen., MD  traMADol  (ULTRAM ) 50 MG tablet Take 1 tablet (50 mg total) by mouth every 6 (six) hours as needed for moderate pain (pain score 4-6) or severe pain (pain score 7-10) (post-operatively). 11/04/23 11/03/24  Melody Spurling., MD  warfarin (COUMADIN ) 2.5 MG tablet Take 1.25-2.5 mg by mouth daily. 12/20/23   [provider]     Physical Exam    Vital Signs:  Johnny Nanas Micciche does not have vital signs available for review today.  Given telephonic nature of communication, physical exam is limited. AAOx3. NAD. Normal affect.  Speech and respirations are unlabored.   Assessment & Plan    Primary Cardiologist: Antionette Kirks, MD  Preoperative cardiovascular risk assessment.  Transurethral resection of bladder tumor by Dr. Secundino Dach on 01/13/2024.  Chart reviewed as part of pre-operative protocol coverage. According to the RCRI, patient has a 0.9% risk of MACE. Patient reports activity equivalent to >4.0 METS (very active at home performing moderate to heavy yard work and other household activities).   Given past medical history and time since last visit, based on ACC/AHA guidelines, Gurjot Peardon Hsiung would be at acceptable risk for the planned procedure without further cardiovascular testing.   Patient was advised that if he develops new symptoms prior to surgery to contact our office to arrange a follow-up appointment.  he verbalized understanding.  Per Pharm D, patient may hold Coumadin  for 5 days prior to procedure.  Patient will not need bridging with Lovenox  (enoxaparin ) around procedure.  I will route this recommendation to the requesting party via Epic fax function.  Please call with questions.  Time:   Today, I have spent 5 minutes with the patient with telehealth technology discussing medical history, symptoms, and management plan.     Morey Ar, NP  01/06/2024, 7:28 AM

## 2024-01-11 ENCOUNTER — Encounter (HOSPITAL_COMMUNITY): Payer: Self-pay | Admitting: Urology

## 2024-01-11 NOTE — Progress Notes (Signed)
 Spoke w/ via phone for pre-op interview---  pt Lab needs dos----   istat/ pt/inr      Lab results------ current EKG in epic/ chart COVID test -----patient states asymptomatic no test needed Arrive at -------  0730 on 01-13-2024 NPO after MN w/ exception sips of water w/ meds Pre-Surgery Ensure or G2:  Med rec completed Medications to take morning of surgery ----- gabapentin , synthroid  Diabetic medication ----- n/a  GLP1 agonist last dose: GLP1 instructions:  Patient instructed no nail polish to be worn day of surgery Patient instructed to bring photo id and insurance card day of surgery Patient aware to have Driver (ride ) / caregiver    for 24 hours after surgery - son, scott Wilhoite and daughter, terza lowermilk Patient Special Instructions ----- soap shower morning of surgery Pre-Op special Instructions -----  pt has telephone cardiac clearance by Morey Ar NP on 01-06-2024 in epic / chart.  Last dose coumadin  01-07-2024  Patient verbalized understanding of instructions that were given at this phone interview. Patient denies chest pain, sob, fever, cough at the interview.    Anesthesia Review:  HTN;  CAD , MI in 2000 s/p PCI x2 stents &  12/ 2017 NSTEMI s/p CABG x4;  Perm AFib on coumadin ;  AAA , 4.5cm;  PVD;  CKD 3;  Muscle invasive bladder cancer  Pt denies cardiac s&s, no sob w/ any activity,  and no peripheral swelling.  Pt was actual outside chopping the wood from a tree he took down yesterday when called him today.    PCP:  Dr Dyana Glade. Bernetta Brilliant  (12-29-2023) Cardiologist : Dr Tia Flowers (lov 10-13-2023)  Chest x-ray :  CT 11-15-2022 EKG :  10-13-2023 Echo : 03-19-2022 Stress test:  NUC 11-02-2019 Cardiac Cath : 08-11-2016  Activity level:   see above Sleep Study/ CPAP :  no Fasting Blood Sugar :      / Checks Blood Sugar -- times a day:  pre-diabetic ,  does not check  Blood Thinner/ Instructions Audry Blinks Dose:  Coumadin  ASA / Instructions/ Last Dose :   pt stated was  given instructions to stop 5 days prior to surgery. Stated last dose 01-07-2024

## 2024-01-12 MED ORDER — GENTAMICIN SULFATE 40 MG/ML IJ SOLN
5.0000 mg/kg | INTRAVENOUS | Status: AC
  Administered 2024-01-13: 330 mg via INTRAVENOUS
  Filled 2024-01-12 (×2): qty 8.25

## 2024-01-13 ENCOUNTER — Ambulatory Visit (HOSPITAL_COMMUNITY): Payer: Self-pay

## 2024-01-13 ENCOUNTER — Ambulatory Visit (HOSPITAL_COMMUNITY)

## 2024-01-13 ENCOUNTER — Other Ambulatory Visit: Payer: Self-pay

## 2024-01-13 ENCOUNTER — Ambulatory Visit (HOSPITAL_COMMUNITY)
Admission: RE | Admit: 2024-01-13 | Discharge: 2024-01-13 | Disposition: A | Discharge status: Home / Self Care | Attending: Urology | Admitting: Urology

## 2024-01-13 ENCOUNTER — Ambulatory Visit (HOSPITAL_BASED_OUTPATIENT_CLINIC_OR_DEPARTMENT_OTHER): Payer: Self-pay

## 2024-01-13 ENCOUNTER — Encounter (HOSPITAL_COMMUNITY)
Admission: RE | Disposition: A | Discharge status: Home / Self Care | Payer: Self-pay | Source: Home / Self Care | Attending: Urology

## 2024-01-13 ENCOUNTER — Encounter (HOSPITAL_COMMUNITY): Payer: Self-pay | Admitting: Urology

## 2024-01-13 DIAGNOSIS — I4891 Unspecified atrial fibrillation: Secondary | ICD-10-CM | POA: Diagnosis not present

## 2024-01-13 DIAGNOSIS — Z87891 Personal history of nicotine dependence: Secondary | ICD-10-CM

## 2024-01-13 DIAGNOSIS — N183 Chronic kidney disease, stage 3 unspecified: Secondary | ICD-10-CM | POA: Insufficient documentation

## 2024-01-13 DIAGNOSIS — D494 Neoplasm of unspecified behavior of bladder: Secondary | ICD-10-CM | POA: Diagnosis not present

## 2024-01-13 DIAGNOSIS — I10 Essential (primary) hypertension: Secondary | ICD-10-CM | POA: Diagnosis not present

## 2024-01-13 DIAGNOSIS — E1122 Type 2 diabetes mellitus with diabetic chronic kidney disease: Secondary | ICD-10-CM | POA: Diagnosis not present

## 2024-01-13 DIAGNOSIS — E1151 Type 2 diabetes mellitus with diabetic peripheral angiopathy without gangrene: Secondary | ICD-10-CM | POA: Diagnosis not present

## 2024-01-13 DIAGNOSIS — Z833 Family history of diabetes mellitus: Secondary | ICD-10-CM | POA: Diagnosis not present

## 2024-01-13 DIAGNOSIS — Q631 Lobulated, fused and horseshoe kidney: Secondary | ICD-10-CM | POA: Diagnosis not present

## 2024-01-13 DIAGNOSIS — C679 Malignant neoplasm of bladder, unspecified: Secondary | ICD-10-CM | POA: Diagnosis not present

## 2024-01-13 DIAGNOSIS — J449 Chronic obstructive pulmonary disease, unspecified: Secondary | ICD-10-CM | POA: Insufficient documentation

## 2024-01-13 DIAGNOSIS — I129 Hypertensive chronic kidney disease with stage 1 through stage 4 chronic kidney disease, or unspecified chronic kidney disease: Secondary | ICD-10-CM | POA: Diagnosis not present

## 2024-01-13 DIAGNOSIS — I2510 Atherosclerotic heart disease of native coronary artery without angina pectoris: Secondary | ICD-10-CM

## 2024-01-13 DIAGNOSIS — Z7901 Long term (current) use of anticoagulants: Secondary | ICD-10-CM | POA: Insufficient documentation

## 2024-01-13 DIAGNOSIS — Z8249 Family history of ischemic heart disease and other diseases of the circulatory system: Secondary | ICD-10-CM | POA: Diagnosis not present

## 2024-01-13 DIAGNOSIS — I714 Abdominal aortic aneurysm, without rupture, unspecified: Secondary | ICD-10-CM | POA: Diagnosis not present

## 2024-01-13 DIAGNOSIS — N3289 Other specified disorders of bladder: Secondary | ICD-10-CM | POA: Diagnosis not present

## 2024-01-13 DIAGNOSIS — I252 Old myocardial infarction: Secondary | ICD-10-CM | POA: Diagnosis not present

## 2024-01-13 DIAGNOSIS — N3020 Other chronic cystitis without hematuria: Secondary | ICD-10-CM | POA: Diagnosis not present

## 2024-01-13 DIAGNOSIS — Z01818 Encounter for other preprocedural examination: Secondary | ICD-10-CM

## 2024-01-13 HISTORY — DX: Lobulated, fused and horseshoe kidney: Q63.1

## 2024-01-13 HISTORY — DX: Other specified disorders of bone density and structure, unspecified site: M85.80

## 2024-01-13 HISTORY — DX: Unspecified symptoms and signs involving the genitourinary system: R39.9

## 2024-01-13 HISTORY — DX: Poor urinary stream: R39.12

## 2024-01-13 HISTORY — DX: Vitreous degeneration, bilateral: H43.813

## 2024-01-13 HISTORY — DX: Long term (current) use of anticoagulants: Z79.01

## 2024-01-13 HISTORY — PX: TRANSURETHRAL RESECTION OF BLADDER TUMOR: SHX2575

## 2024-01-13 HISTORY — PX: CYSTOSCOPY W/ RETROGRADES: SHX1426

## 2024-01-13 HISTORY — DX: Prediabetes: R73.03

## 2024-01-13 HISTORY — DX: Unspecified macular degeneration: H35.30

## 2024-01-13 HISTORY — DX: Emphysema, unspecified: J43.9

## 2024-01-13 HISTORY — DX: Permanent atrial fibrillation: I48.21

## 2024-01-13 HISTORY — DX: Mixed hyperlipidemia: E78.2

## 2024-01-13 LAB — POCT I-STAT, CHEM 8
BUN: 23 mg/dL (ref 8–23)
Calcium, Ion: 1.16 mmol/L (ref 1.15–1.40)
Chloride: 105 mmol/L (ref 98–111)
Creatinine, Ser: 1.1 mg/dL (ref 0.61–1.24)
Glucose, Bld: 99 mg/dL (ref 70–99)
HCT: 43 % (ref 39.0–52.0)
Hemoglobin: 14.6 g/dL (ref 13.0–17.0)
Potassium: 4 mmol/L (ref 3.5–5.1)
Sodium: 139 mmol/L (ref 135–145)
TCO2: 22 mmol/L (ref 22–32)

## 2024-01-13 LAB — PROTIME-INR
INR: 1.2 (ref 0.8–1.2)
Prothrombin Time: 15.3 s — ABNORMAL HIGH (ref 11.4–15.2)

## 2024-01-13 SURGERY — TURBT (TRANSURETHRAL RESECTION OF BLADDER TUMOR)
Anesthesia: General | Site: Urethra

## 2024-01-13 MED ORDER — PHENYLEPHRINE 80 MCG/ML (10ML) SYRINGE FOR IV PUSH (FOR BLOOD PRESSURE SUPPORT)
PREFILLED_SYRINGE | INTRAVENOUS | Status: DC | PRN
  Administered 2024-01-13: 80 ug via INTRAVENOUS

## 2024-01-13 MED ORDER — FENTANYL CITRATE (PF) 250 MCG/5ML IJ SOLN
INTRAMUSCULAR | Status: DC | PRN
  Administered 2024-01-13: 50 ug via INTRAVENOUS

## 2024-01-13 MED ORDER — ORAL CARE MOUTH RINSE: 15.0000 mL | Freq: Once | OROMUCOSAL | Status: AC

## 2024-01-13 MED ORDER — CHLORHEXIDINE GLUCONATE 0.12 % MT SOLN
15.0000 mL | Freq: Once | OROMUCOSAL | Status: AC
  Administered 2024-01-13: 15 mL via OROMUCOSAL

## 2024-01-13 MED ORDER — SODIUM CHLORIDE 0.9 % IR SOLN
Status: DC | PRN
  Administered 2024-01-13: 3000 mL

## 2024-01-13 MED ORDER — KETOROLAC TROMETHAMINE 30 MG/ML IJ SOLN
INTRAMUSCULAR | Status: AC
  Filled 2024-01-13: qty 1

## 2024-01-13 MED ORDER — LIDOCAINE 2% (20 MG/ML) 5 ML SYRINGE
INTRAMUSCULAR | Status: DC | PRN
  Administered 2024-01-13: 80 mg via INTRAVENOUS

## 2024-01-13 MED ORDER — DEXAMETHASONE SODIUM PHOSPHATE 10 MG/ML IJ SOLN
INTRAMUSCULAR | Status: DC | PRN
  Administered 2024-01-13: 4 mg via INTRAVENOUS

## 2024-01-13 MED ORDER — IOHEXOL 300 MG/ML  SOLN
INTRAMUSCULAR | Status: DC | PRN
  Administered 2024-01-13: 20 mL via URETHRAL

## 2024-01-13 MED ORDER — TRAMADOL HCL 50 MG PO TABS
50.0000 mg | ORAL_TABLET | Freq: Four times a day (QID) | ORAL | 0 refills | Status: AC | PRN
Start: 2024-01-13 — End: 2025-01-12

## 2024-01-13 MED ORDER — CHLORHEXIDINE GLUCONATE 0.12 % MT SOLN
OROMUCOSAL | Status: AC
  Filled 2024-01-13: qty 15

## 2024-01-13 MED ORDER — FENTANYL CITRATE (PF) 100 MCG/2ML IJ SOLN
INTRAMUSCULAR | Status: AC
  Filled 2024-01-13: qty 2

## 2024-01-13 MED ORDER — PROPOFOL 10 MG/ML IV BOLUS
INTRAVENOUS | Status: DC | PRN
  Administered 2024-01-13: 120 mg via INTRAVENOUS

## 2024-01-13 MED ORDER — ONDANSETRON HCL 4 MG/2ML IJ SOLN
INTRAMUSCULAR | Status: DC | PRN
  Administered 2024-01-13: 4 mg via INTRAVENOUS

## 2024-01-13 MED ORDER — DEXAMETHASONE SODIUM PHOSPHATE 10 MG/ML IJ SOLN
INTRAMUSCULAR | Status: AC
  Filled 2024-01-13: qty 1

## 2024-01-13 MED ORDER — PROPOFOL 10 MG/ML IV BOLUS
INTRAVENOUS | Status: AC
Start: 2024-01-13 — End: ?
  Filled 2024-01-13: qty 20

## 2024-01-13 MED ORDER — PHENYLEPHRINE 80 MCG/ML (10ML) SYRINGE FOR IV PUSH (FOR BLOOD PRESSURE SUPPORT)
PREFILLED_SYRINGE | INTRAVENOUS | Status: AC
  Filled 2024-01-13: qty 10

## 2024-01-13 MED ORDER — SODIUM CHLORIDE 0.9 % IV SOLN: INTRAVENOUS | Status: DC

## 2024-01-13 SURGICAL SUPPLY — 17 items
BAG URO CATCHER STRL LF (MISCELLANEOUS) ×2 IMPLANT
CATH URETL OPEN END 6FR 70 (CATHETERS) ×2 IMPLANT
CLOTH BEACON ORANGE TIMEOUT ST (SAFETY) ×2 IMPLANT
GLOVE BIO SURGEON STRL SZ7.5 (GLOVE) ×2 IMPLANT
GLOVE BIOGEL PI IND STRL 6.5 (GLOVE) IMPLANT
GLOVE SURG SS PI 6.5 STRL IVOR (GLOVE) IMPLANT
GOWN STRL REUS W/ TWL LRG LVL3 (GOWN DISPOSABLE) ×2 IMPLANT
GUIDEWIRE STR DUAL SENSOR (WIRE) IMPLANT
KIT TURNOVER KIT B (KITS) ×2 IMPLANT
LOOP CUT BIPOLAR 24F LRG (ELECTROSURGICAL) IMPLANT
MANIFOLD NEPTUNE II (INSTRUMENTS) ×2 IMPLANT
PACK CYSTO (CUSTOM PROCEDURE TRAY) ×2 IMPLANT
SET IRRIG Y TYPE TUR BLADDER L (SET/KITS/TRAYS/PACK) ×2 IMPLANT
SLEEVE SCD COMPRESS KNEE MED (STOCKING) ×2 IMPLANT
SOL .9 NS 3000ML IRR UROMATIC (IV SOLUTION) ×2 IMPLANT
SOL PREP POV-IOD 4OZ 10% (MISCELLANEOUS) ×2 IMPLANT
TUBE CONNECTING 12X1/4 (SUCTIONS) IMPLANT

## 2024-01-13 NOTE — Op Note (Signed)
 NAME: ROCKFORD, LASSITER MEDICAL RECORD NO: [id number] ACCOUNT NO: [id number] DATE OF BIRTH: [birth year] FACILITY: [facility] LOCATION: [facility] PHYSICIAN: Osborn Blaze, MD  Operative Report   DATE OF PROCEDURE: 01/13/2024  PREOPERATIVE DIAGNOSIS:  High-grade muscle-invasive bladder cancer in elderly man.  POSTOPERATIVE DIAGNOSIS:  High-grade muscle-invasive bladder cancer in elderly man.  PROCEDURE PERFORMED: 1. Cystoscopy bilateral retrograde pyelogram interpretation. 2. Transurethral resection of bladder tumor, volume medium.  ESTIMATED BLOOD LOSS:  Nil.  COMPLICATIONS:  None.  SPECIMENS: 1.  Old resection site. 2.  Base of old resection site.  FINDINGS: 1. Retrograde pyelograms consistent with horseshoe kidney.  No filling defects. 2.  No residual or rapidly recurrent tumor. 3.  Old resection site in the intertrigone area approximately 4 cm square.  This was resected.  INDICATIONS:  The patient is a very pleasant 83 year old man with a recent history of muscle invasive bladder cancer, found on workup of hematuria.  He underwent initial transurethral resection several months ago and did very well with this.  He has an  excellent functional status, but is 83 years old and does have some cardiovascular comorbidity.  Options were discussed for further management, including curative and non-curative protocols, and he wishes to proceed with a more palliative goal tract of  treating as per stage I.  Honoring his goals of care with this path, we recommended restaging transurethral resection to ensure maximal local control and no rapidly residual tumor.  He presents for this today.  Informed consent was obtained and placed in  medical record.  DESCRIPTION OF PROCEDURE:  The patient being identified and verified and the procedure being transurethral resection of bladder tumor and retrograde pyelogram were confirmed.  Procedure timeout was performed.  Intravenous antibiotics administered.    General LMA anesthesia was induced.  The patient was placed into a low lithotomy position.  A sterile field was created, prepped and draped the patient's penis, perineum and proximal thighs using iodine.  A cystourethroscopy was performed using 21-French  rigid cystoscope with offset lens.   Inspection of the patient's anterior and posterior urethra was unremarkable.  Examination of the bladder revealed some old resection site in the intertrigone area approximately 4 cm square.  There was some fibrinous  tissue in this area, but no residual or rapidly recurrent papillary tumor.  This was quite favorable.  Attention was directed to the retrograde pyelogram.  The right ureteral orifice was cannulated with a 6-French end-hole catheter and a right retrograde  pyelogram was obtained.  Right retrograde pyelogram demonstrated a singe right ureter and a single system right kidney with horseshoe anatomy as anticipated.  No filling defects.  Next, a left retrograde pyelogram was obtained.  Left retrograde pyelogram demonstrated a singe left ureter and a single system left kidney without filling defects or narrowing noted.  Next, the cystoscope was exchanged with a 26-French resectoscope sheath with visual obturator and using a resectoscope  loop the area of the prior tumor site was carefully resected down to the superficial fibromuscular stroma of the urinary bladder.  These tissue fragments were set aside and labeled as "old resection site."  Again, the total surface area was  approximately 4 to 4.5 cm square.  Next, cold-cup biopsy forceps were used to obtain representative deep seromuscular samples of this set aside and labeled as "base old resection site."  The area was fulgurated.  Hemostasis was excellent.  The patient  has minimal clinical prostatic hypertrophy, and it was not felt that a catheterization to be warranted.  The bladder was partially emptied per cystoscope.  Procedure was then terminated.  The  patient tolerated the procedure well.  No immediate  perioperative complications.  The patient was taken to postanesthesia care in stable condition.  Plan to discharge home.   PUS D: 01/13/2024 10:06:56 am T: 01/13/2024 1:02:00 pm  JOB: [id number]/ [id number]

## 2024-01-13 NOTE — H&P (Signed)
 Thomas Fuentes is an 83 y.o. male.    Chief Complaint: Pre-OP Transurethral Resection of Bladder Tumor  HPI:   1 - Muscle-Invasive Bladder Cancer - T2G3 large volume bladder cancer by TURBT 10/2023. Urothelial with 40% Sq differentiation. Staging CT 08/2023 no hydro / upper tract lesions. Remote >20PY smoker. Had med-onc eval 11/2023 and choses palliative intent path with treast as per stage 1.   2 - Horshose Kidneys - incidetnal horshoe kidney on CT 08/2023. NO hydro. Single ureters bilaterally.   PMH sig for CAD/CABG/Stent/AFib/Coumadin  (follows M. Arida [facility]), not limiting. No prior abd surgeries. Lives alone with lady partner x years, son Thomas Fuentes is 3 houses down and very involved. His PCP is Jeana Michaels MD with [facility].   Today "Thomas Fuentes" is seen to proceed with restaging transurethral resection of bladder tumor for aggressive muscle invasive disease. Goal is max local control. Held coumadin  as instructed.   Past Medical History:  Diagnosis Date   Ascending aortic aneurysm (HCC) 2017   followed by dr Sherene Dilling (CVT) a. 10/2019 CTA Chest: 4.3cm asc TAA; b. 10/2020 CTA Chest: 4.2cm asc TAA; c. 10/2021 CTA Chest: 4.4cm TAA.   Chronic renal disease, stage III (HCC)    Complication of anesthesia    slow to wake up , post colonoscopy   Coronary artery disease 2000   cardiologist--- dr a Selinda Dales;  a. MI 2000 s/p PCI and 2 stents;  b. LHC 08/09/2016 o-pLAD 30%, mLAD 99%, OM2 50%, OM3 85%, pRCA 99%, mid RCA 60%, RPDA 60%, LVEDP mod elevated. CABG x4  in 07/2016 :  LIMA to LAD, SVG to OM1 & dCFx , SVG to PDA; c. 10/2019 MV: No ischemia or scar. Low risk.   Current use of long term anticoagulation    coumadin --- managed by cardiology   Emphysema lung (HCC)    asymptomatic   History of MI (myocardial infarction) 2000   s/p  PCI and stenting   History of non-ST elevation myocardial infarction (NSTEMI) 08/08/2016   severe CAD   Horseshoe kidney    bilateral   Hyperlipidemia, mixed     Hypertension    Hypothyroidism    Lower urinary tract symptoms (LUTS)    Macular degeneration of both eyes    left eye -- dry/   right eye,  wet,  treated with injections   Osteopenia    Peripheral neuropathy    Peripheral vascular disease (HCC)    Permanent atrial fibrillation (HCC)    first dx post op CABG 12/ 2017;  followed by cardiology-- dr a. Selinda Dales,  on coumadin ,  controlled rate and asymptomatic   Pre-diabetes    S/P CABG x 4 08/11/2016   by dr gerhardt;    LIMA--LAD/   seq. 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   urologist--- dr Secundino Dach;   dx 03/ 2023  w overlapping sites,  T2G3,  High Grade Papillary   Vitreous degeneration of both eyes    Weak urinary stream     Past Surgical History:  Procedure Laterality Date   CARDIAC CATHETERIZATION N/A 08/09/2016   Procedure: Left Heart Cath and Coronary Angiography;  Surgeon: Wenona Hamilton, MD;  Location: [facility];  Service: Cardiovascular;  Laterality: N/A;   CATARACT EXTRACTION W/PHACO Left 09/07/2022   Procedure:  CATARACT EXTRACTION PHACO AND INTRAOCULAR LENS PLACEMENT (IOC) LEFT 9.87 00:56.1;  Surgeon: Clair Crews, MD;  Location: [facility];  Service: Ophthalmology;  Laterality: Left;   CATARACT EXTRACTION W/PHACO Right 09/21/2022   Procedure: CATARACT EXTRACTION PHACO AND INTRAOCULAR LENS PLACEMENT (IOC) RIGHT 7.31 00:48.6;  Surgeon: Clair Crews, MD;  Location: [facility];  Service: Ophthalmology;  Laterality: Right;   COLONOSCOPY     CORONARY ANGIOPLASTY WITH STENT PLACEMENT  2000   @ [facility]   ;    x2   stents   CORONARY ARTERY BYPASS GRAFT N/A 08/11/2016   Procedure: CORONARY ARTERY BYPASS GRAFTING on pump using left internal mammary artery to left anterior descending coronary artery , portion of right greater saphenous vein to right  coronary artery, portion of right greater saphenous vein graft to distal circumflex.;  Surgeon: Norita Beauvais, MD;  Location: [facility];  Service: Open Heart Surgery;  Laterality: N/A;   ENDOVEIN HARVEST OF GREATER SAPHENOUS VEIN Right 08/11/2016   Procedure: ENDOVEIN HARVEST OF GREATER SAPHENOUS VEIN;  Surgeon: Norita Beauvais, MD;  Location: [facility] OR;  Service: Open Heart Surgery;  Laterality: Right;   TEE WITHOUT CARDIOVERSION N/A 08/11/2016   Procedure: TRANSESOPHAGEAL ECHOCARDIOGRAM (TEE);  Surgeon: Norita Beauvais, MD;  Location: [facility] OR;  Service: Open Heart Surgery;  Laterality: N/A;   TRANSURETHRAL RESECTION OF BLADDER TUMOR Bilateral 11/04/2023   Procedure: TRANSURETHRAL RESECTION OF BLADDER TUMOR (TURBT)/BILATERAL RETROGRADE;  Surgeon: Melody Spurling., MD;  Location: [facility];  Service: Urology;  Laterality: Bilateral;  60 MINUTES NEEDED FOR CASE    Family History  Problem Relation Age of Onset   Hypertension Mother    Prostate cancer Father    Diabetes Sister    Social History:  reports that he quit smoking about 24 years ago. His smoking use included cigarettes. He started smoking about 64 years ago. He has never used smokeless tobacco. He reports that he does not currently use alcohol . He reports that he does not use drugs.  Allergies:  Allergies  Allergen Reactions   Lidocaine  Anaphylaxis and Other (See Comments)    novacaine and lidocaine   Other Reaction(s): small doses ok   Meloxicam Other (See Comments)   Procaine Other (See Comments)   Quinolones     Patient was warned about not using Cipro and similar antibiotics. Recent studies have raised concern that fluoroquinolone antibiotics could be associated with an increased risk of aortic aneurysm Fluoroquinolones have non-antimicrobial properties that might jeopardise the integrity of the extracellular matrix of the vascular wall In a  propensity score matched cohort study in [place], there was a 66% increased rate of  aortic aneurysm or dissection associated with oral fluoroquinolone use, compared wit   Statins Nausea And Vomiting    GI symptoms on almost all statins Other reaction(s): GI intolerance (Lipitor, Zocor)    No medications prior to admission.    No results found for this or any previous visit (from the past 48 hours). No results found.  Review of Systems  Constitutional:  Negative for chills and fever.  All other systems reviewed and are negative.   Height 5\' 7"  (1.702 m), weight 68 kg. Physical Exam Vitals reviewed.  HENT:     Head: Normocephalic.  Eyes:     Pupils: Pupils are equal, round, and reactive to light.  Cardiovascular:     Rate and Rhythm: Normal rate.  Pulmonary:     Effort: Pulmonary effort is normal.  Abdominal:  General: Abdomen is flat.  Genitourinary:    Comments: No CVAT at present Musculoskeletal:        General: Normal range of motion.     Cervical back: Normal range of motion.  Skin:    General: Skin is warm.  Neurological:     General: No focal deficit present.     Mental Status: He is alert.      Assessment/Plan  Proceed as planned with restaging TURBT. Risks, benefits, alternatives, expected peri-op course discussed previously and reiterated today.   Melody Spurling., MD 01/13/2024, 6:50 AM

## 2024-01-13 NOTE — Anesthesia Procedure Notes (Signed)
 Procedure Name: LMA Insertion Date/Time: 01/13/2024 9:29 AM  Performed by: Trenton Frock, CRNAPre-anesthesia Checklist: Patient identified, Emergency Drugs available, Suction available and Patient being monitored Patient Re-evaluated:Patient Re-evaluated prior to induction Oxygen Delivery Method: Circle System Utilized Preoxygenation: Pre-oxygenation with 100% oxygen Induction Type: IV induction Ventilation: Mask ventilation without difficulty LMA: LMA inserted LMA Size: 4.0 Tube type: Oral Number of attempts: 1 Airway Equipment and Method: Bite block Placement Confirmation: positive ETCO2 Tube secured with: Tape Dental Injury: Teeth and Oropharynx as per pre-operative assessment

## 2024-01-13 NOTE — Anesthesia Preprocedure Evaluation (Addendum)
 Anesthesia Evaluation  Patient identified by MRN, date of birth, ID band Patient awake    Reviewed: Allergy & Precautions, NPO status , Patient's Chart, lab work & pertinent test results  History of Anesthesia Complications Negative for: history of anesthetic complications  Airway Mallampati: III  TM Distance: >3 FB Neck ROM: Limited    Dental  (+) Teeth Intact, Dental Advisory Given, Implants   Pulmonary shortness of breath and with exertion, COPD, neg recent URI, former smoker   Pulmonary exam normal breath sounds clear to auscultation       Cardiovascular hypertension, (-) angina + CAD, + Past MI (NSTEMI), + CABG (2017) and + Peripheral Vascular Disease (AAA)  + dysrhythmias (on coumadin ) Atrial Fibrillation  Rhythm:Irregular Rate:Normal     Neuro/Psych    GI/Hepatic   Endo/Other  diabetesHypothyroidism    Renal/GU Renal diseaseLab Results      Component                Value               Date                         K                        4.8                 10/26/2023                CO2                      25                  10/26/2023                BUN                      26 (H)              10/26/2023                CREATININE               1.20                10/26/2023                GFRNONAA                 60 (L)              10/26/2023                   Bladder ca    Musculoskeletal negative musculoskeletal ROS (+)    Abdominal   Peds  Hematology Lab Results      Component                Value               Date                      WBC                      7.4                 10/26/2023  HGB                      14.2                10/26/2023                HCT                      44.2                10/26/2023                MCV                      98.4                10/26/2023                PLT                      173                 10/26/2023              Anesthesia  Other Findings All quinolones , procaine, statins  Reproductive/Obstetrics                             Anesthesia Physical Anesthesia Plan  ASA: 3  Anesthesia Plan: General   Post-op Pain Management: Ofirmev  IV (intra-op)*   Induction: Intravenous  PONV Risk Score and Plan: 4 or greater and Ondansetron , Dexamethasone  and Treatment may vary due to age or medical condition  Airway Management Planned: LMA  Additional Equipment: None  Intra-op Plan:   Post-operative Plan: Extubation in OR  Informed Consent: I have reviewed the patients History and Physical, chart, labs and discussed the procedure including the risks, benefits and alternatives for the proposed anesthesia with the patient or authorized representative who has indicated his/her understanding and acceptance.     Dental advisory given  Plan Discussed with: CRNA  Anesthesia Plan Comments: (See PAT note from 2/26 by Rometta Coad PA-C )        Anesthesia Quick Evaluation

## 2024-01-13 NOTE — Discharge Instructions (Addendum)
 1 - You may have urinary urgency (bladder spasms) and bloody urine on / off for up to 2 weeks. This is normal.  2 - Call MD or go to ER for fever >102, severe pain / nausea / vomiting not relieved by medications, or acute change in medical status.  Post Anesthesia Home Care Instructions  Activity: Get plenty of rest for the remainder of the day. A responsible adult should stay with you for 24 hours following the procedure.  For the next 24 hours, DO NOT: -Drive a car -Advertising copywriter -Drink alcoholic beverages -Take any medication unless instructed by your physician -Make any legal decisions or sign important papers.  Meals: Start with liquid foods such as gelatin or soup. Progress to regular foods as tolerated. Avoid greasy, spicy, heavy foods. If nausea and/or vomiting occur, drink only clear liquids until the nausea and/or vomiting subsides. Call your physician if vomiting continues.  Special Instructions/Symptoms: Your throat may feel dry or sore from the anesthesia or the breathing tube placed in your throat during surgery. If this causes discomfort, gargle with warm salt water. The discomfort should disappear within 24 hours.  If you had a scopolamine patch placed behind your ear for the management of post- operative nausea and/or vomiting:  1. The medication in the patch is effective for 72 hours, after which it should be removed.  Wrap patch in a tissue and discard in the trash. Wash hands thoroughly with soap and water. 2. You may remove the patch earlier than 72 hours if you experience unpleasant side effects which may include dry mouth, dizziness or visual disturbances. 3. Avoid touching the patch. Wash your hands with soap and water after contact with the patch.  Call your surgeon if you experience:   1.  Fever over 101.0. 2.  Inability to urinate. 3.  Nausea and/or vomiting. 4.  Extreme swelling or bruising at the surgical site. 5.  Continued bleeding from the  incision. 6.  Increased pain, redness or drainage from the incision. 7.  Problems related to your pain medication. 8. Any change in color, movement and/or sensation 9. Any problems and/or concerns

## 2024-01-13 NOTE — Brief Op Note (Signed)
 01/13/2024  10:01 AM  PATIENT:  Thomas Fuentes  83 y.o. male  PRE-OPERATIVE DIAGNOSIS:  BLADDER CANCER  POST-OPERATIVE DIAGNOSIS:  BLADDER CANCER  PROCEDURE:  Procedure(s): TURBT (TRANSURETHRAL RESECTION OF BLADDER TUMOR) (N/A) CYSTOSCOPY, WITH RETROGRADE PYELOGRAM (Bilateral)  SURGEON:  Surgeons and Role:    * Manny, Harvey Linen., MD - Primary  PHYSICIAN ASSISTANT:   ASSISTANTS: none   ANESTHESIA:   general  EBL:  0 mL   BLOOD ADMINISTERED:none  DRAINS: none   LOCAL MEDICATIONS USED:  NONE  SPECIMEN:  Source of Specimen:  1 - old resection site; 2 - base of old resection site  DISPOSITION OF SPECIMEN:  PATHOLOGY  COUNTS:  YES  TOURNIQUET:  * No tourniquets in log *  DICTATION: .Other Dictation: Dictation Number [id number]  PLAN OF CARE: Discharge to home after PACU  PATIENT DISPOSITION:  PACU - hemodynamically stable.   Delay start of Pharmacological VTE agent (>24hrs) due to surgical blood loss or risk of bleeding: yes

## 2024-01-13 NOTE — Transfer of Care (Signed)
 Immediate Anesthesia Transfer of Care Note  Patient: Thomas Fuentes  Procedure(s) Performed: TURBT (TRANSURETHRAL RESECTION OF BLADDER TUMOR) (Bladder) CYSTOSCOPY, WITH RETROGRADE PYELOGRAM (Bilateral: Urethra)  Patient Location: PACU  Anesthesia Type:General  Level of Consciousness: drowsy and patient cooperative  Airway & Oxygen Therapy: Patient Spontanous Breathing and Patient connected to nasal cannula oxygen  Post-op Assessment: Report given to RN and Post -op Vital signs reviewed and stable  Post vital signs: Reviewed and stable  Last Vitals:  Vitals Value Taken Time  BP 107/79 01/13/24 1009  Temp 97.6   Pulse 63 01/13/24 1011  Resp 14 01/13/24 1011  SpO2 98 % 01/13/24 1011  Vitals shown include unfiled device data.  Last Pain:  Vitals:   01/13/24 0806  TempSrc: Oral  PainSc: 0-No pain      Patients Stated Pain Goal: 5 (01/13/24 0806)  Complications: No notable events documented.

## 2024-01-15 NOTE — Anesthesia Postprocedure Evaluation (Signed)
 Anesthesia Post Note  Patient: Thomas Fuentes  Procedure(s) Performed: TURBT (TRANSURETHRAL RESECTION OF BLADDER TUMOR) (Bladder) CYSTOSCOPY, WITH RETROGRADE PYELOGRAM (Bilateral: Urethra)     Patient location during evaluation: PACU Anesthesia Type: General Level of consciousness: sedated and patient cooperative Pain management: pain level controlled Vital Signs Assessment: post-procedure vital signs reviewed and stable Respiratory status: spontaneous breathing Cardiovascular status: stable Anesthetic complications: no  No notable events documented.  Last Vitals:  Vitals:   01/13/24 1009 01/13/24 1030  BP: 107/79 101/69  Pulse: 68 70  Resp: 20 18  Temp: 36.4 C   SpO2: 98% 98%    Last Pain:  Vitals:   01/13/24 1009  TempSrc:   PainSc: Asleep                 Gorman Laughter

## 2024-01-16 ENCOUNTER — Encounter (HOSPITAL_COMMUNITY): Payer: Self-pay | Admitting: Urology

## 2024-01-17 ENCOUNTER — Encounter (HOSPITAL_COMMUNITY): Payer: Self-pay | Admitting: Urology

## 2024-01-17 LAB — SURGICAL PATHOLOGY

## 2024-01-18 ENCOUNTER — Other Ambulatory Visit: Payer: Self-pay | Admitting: Cardiovascular Disease

## 2024-01-18 DIAGNOSIS — E785 Hyperlipidemia, unspecified: Secondary | ICD-10-CM

## 2024-01-24 DIAGNOSIS — Q631 Lobulated, fused and horseshoe kidney: Secondary | ICD-10-CM | POA: Diagnosis not present

## 2024-01-24 DIAGNOSIS — C678 Malignant neoplasm of overlapping sites of bladder: Secondary | ICD-10-CM | POA: Diagnosis not present

## 2024-01-24 DIAGNOSIS — R310 Gross hematuria: Secondary | ICD-10-CM | POA: Diagnosis not present

## 2024-02-09 ENCOUNTER — Other Ambulatory Visit: Payer: Self-pay | Admitting: Cardiovascular Disease

## 2024-02-09 DIAGNOSIS — I4821 Permanent atrial fibrillation: Secondary | ICD-10-CM

## 2024-02-13 DIAGNOSIS — C678 Malignant neoplasm of overlapping sites of bladder: Secondary | ICD-10-CM | POA: Diagnosis not present

## 2024-02-20 DIAGNOSIS — C678 Malignant neoplasm of overlapping sites of bladder: Secondary | ICD-10-CM | POA: Diagnosis not present

## 2024-02-22 ENCOUNTER — Ambulatory Visit: Attending: Cardiovascular Disease

## 2024-02-22 DIAGNOSIS — Z5181 Encounter for therapeutic drug level monitoring: Secondary | ICD-10-CM | POA: Diagnosis not present

## 2024-02-22 DIAGNOSIS — I4819 Other persistent atrial fibrillation: Secondary | ICD-10-CM | POA: Diagnosis not present

## 2024-02-22 LAB — POCT INR: INR: 3 (ref 2.0–3.0)

## 2024-02-22 NOTE — Patient Instructions (Addendum)
 Continue 0.5 tablet Daily except 1 Sunday and Thursday.  INR in 2 weeks.  [phone]

## 2024-02-27 DIAGNOSIS — C678 Malignant neoplasm of overlapping sites of bladder: Secondary | ICD-10-CM | POA: Diagnosis not present

## 2024-03-05 DIAGNOSIS — C678 Malignant neoplasm of overlapping sites of bladder: Secondary | ICD-10-CM | POA: Diagnosis not present

## 2024-03-07 ENCOUNTER — Encounter

## 2024-03-07 DIAGNOSIS — H353231 Exudative age-related macular degeneration, bilateral, with active choroidal neovascularization: Secondary | ICD-10-CM | POA: Diagnosis not present

## 2024-03-07 DIAGNOSIS — L570 Actinic keratosis: Secondary | ICD-10-CM | POA: Diagnosis not present

## 2024-03-12 DIAGNOSIS — C678 Malignant neoplasm of overlapping sites of bladder: Secondary | ICD-10-CM | POA: Diagnosis not present

## 2024-03-14 ENCOUNTER — Ambulatory Visit: Attending: Cardiovascular Disease

## 2024-03-14 DIAGNOSIS — Z5181 Encounter for therapeutic drug level monitoring: Secondary | ICD-10-CM | POA: Diagnosis not present

## 2024-03-14 DIAGNOSIS — I4819 Other persistent atrial fibrillation: Secondary | ICD-10-CM | POA: Insufficient documentation

## 2024-03-14 LAB — POCT INR: INR: 2.6 (ref 2.0–3.0)

## 2024-03-14 NOTE — Patient Instructions (Signed)
 Continue 0.5 tablet Daily except 1 Sunday and Thursday.  INR in 4 weeks.  [phone]

## 2024-03-16 ENCOUNTER — Other Ambulatory Visit: Payer: Self-pay | Admitting: Cardiovascular Disease

## 2024-03-16 DIAGNOSIS — I4821 Permanent atrial fibrillation: Secondary | ICD-10-CM

## 2024-03-19 DIAGNOSIS — C678 Malignant neoplasm of overlapping sites of bladder: Secondary | ICD-10-CM | POA: Diagnosis not present

## 2024-04-10 ENCOUNTER — Encounter: Payer: Self-pay | Admitting: Nurse Practitioner

## 2024-04-10 ENCOUNTER — Ambulatory Visit: Attending: Nurse Practitioner | Admitting: Nurse Practitioner

## 2024-04-10 ENCOUNTER — Other Ambulatory Visit: Payer: Self-pay | Admitting: Nurse Practitioner

## 2024-04-10 VITALS — BP 128/72 | HR 58 | Ht 67.0 in | Wt 150.0 lb

## 2024-04-10 DIAGNOSIS — I2510 Atherosclerotic heart disease of native coronary artery without angina pectoris: Secondary | ICD-10-CM | POA: Insufficient documentation

## 2024-04-10 DIAGNOSIS — E785 Hyperlipidemia, unspecified: Secondary | ICD-10-CM | POA: Insufficient documentation

## 2024-04-10 DIAGNOSIS — N183 Chronic kidney disease, stage 3 unspecified: Secondary | ICD-10-CM | POA: Insufficient documentation

## 2024-04-10 DIAGNOSIS — I714 Abdominal aortic aneurysm, without rupture, unspecified: Secondary | ICD-10-CM | POA: Diagnosis not present

## 2024-04-10 DIAGNOSIS — I10 Essential (primary) hypertension: Secondary | ICD-10-CM | POA: Insufficient documentation

## 2024-04-10 DIAGNOSIS — I7121 Aneurysm of the ascending aorta, without rupture: Secondary | ICD-10-CM | POA: Insufficient documentation

## 2024-04-10 DIAGNOSIS — I4821 Permanent atrial fibrillation: Secondary | ICD-10-CM | POA: Insufficient documentation

## 2024-04-10 NOTE — Progress Notes (Signed)
 Office Visit    Patient Name: Thomas Fuentes Date of Encounter: 04/10/2024  Primary Care Provider:  Loreli Elsie JONETTA Mickey., MD Primary Cardiologist:  Deatrice Cage, MD    Chief Complaint    83 y.o. male with a history of CAD status post non-STEMI and CABG in December 2017, persistent atrial fibrillation, hypertension, hyperlipidemia, ascending aortic and abdominal aneurysm, stage III chronic disease, and cataracts, who presents for follow-up related to CAD.  Past Medical History   Subjective   Past Medical History:  Diagnosis Date   Ascending aortic aneurysm (HCC) 2017   followed by dr lucas (CVT) a. 10/2019 CTA Chest: 4.3cm asc TAA; b. 10/2020 CTA Chest: 4.2cm asc TAA; c. 10/2021 CTA Chest: 4.4cm TAA.   Chronic renal disease, stage III (HCC)    Complication of anesthesia    slow to wake up , post colonoscopy   Coronary artery disease 2000   cardiologist--- dr a deatrice;  a. MI 2000 s/p PCI and 2 stents;  b. LHC 08/09/2016 o-pLAD 30%, mLAD 99%, OM2 50%, OM3 85%, pRCA 99%, mid RCA 60%, RPDA 60%, LVEDP mod elevated. CABG x4  in 07/2016 :  LIMA to LAD, SVG to OM1 & dCFx , SVG to PDA; c. 10/2019 MV: No ischemia or scar. Low risk.   Current use of long term anticoagulation    coumadin --- managed by cardiology   Emphysema lung (HCC)    asymptomatic   History of MI (myocardial infarction) 2000   s/p  PCI and stenting   History of non-ST elevation myocardial infarction (NSTEMI) 08/08/2016   severe CAD   Horseshoe kidney    bilateral   Hyperlipidemia, mixed    Hypertension    Hypothyroidism    Lower urinary tract symptoms (LUTS)    Macular degeneration of both eyes    left eye -- dry/   right eye,  wet,  treated with injections   Osteopenia    Peripheral neuropathy    Peripheral vascular disease (HCC)    Permanent atrial fibrillation (HCC)    first dx post op CABG 12/ 2017;  followed by cardiology-- dr a. deatrice,  on coumadin ,  controlled rate and asymptomatic   Pre-diabetes     S/P CABG x 4 08/11/2016   by dr gerhardt;    LIMA--LAD/   seq. 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   urologist--- dr alvaro;   dx 03/ 2023  w overlapping sites,  T2G3,  High Grade Papillary   Vitreous degeneration of both eyes    Weak urinary stream    Past Surgical History:  Procedure Laterality Date   CARDIAC CATHETERIZATION N/A 08/09/2016   Procedure: Left Heart Cath and Coronary Angiography;  Surgeon: Deatrice DELENA Cage, MD;  Location: [facility];  Service: Cardiovascular;  Laterality: N/A;   CATARACT EXTRACTION W/PHACO Left 09/07/2022   Procedure: CATARACT EXTRACTION PHACO AND INTRAOCULAR LENS PLACEMENT (IOC) LEFT 9.87 00:56.1;  Surgeon: Jaye Elsie, MD;  Location: [facility];  Service: Ophthalmology;  Laterality: Left;   CATARACT EXTRACTION W/PHACO Right 09/21/2022   Procedure: CATARACT EXTRACTION PHACO AND INTRAOCULAR LENS PLACEMENT (IOC) RIGHT 7.31 00:48.6;  Surgeon: Jaye Elsie, MD;  Location: [facility];  Service: Ophthalmology;  Laterality: Right;   COLONOSCOPY     CORONARY ANGIOPLASTY WITH STENT  PLACEMENT  2000   @ [facility]   ;    x2   stents   CORONARY ARTERY BYPASS GRAFT N/A 08/11/2016   Procedure: CORONARY ARTERY BYPASS GRAFTING on pump using left internal mammary artery to left anterior descending coronary artery , portion of right greater saphenous vein to right coronary artery, portion of right greater saphenous vein graft to distal circumflex.;  Surgeon: Dallas KATHEE Jude, MD;  Location: [facility];  Service: Open Heart Surgery;  Laterality: N/A;   CYSTOSCOPY W/ RETROGRADES Bilateral 01/13/2024   Procedure: CYSTOSCOPY, WITH RETROGRADE PYELOGRAM;  Surgeon: Alvaro Ricardo KATHEE Mickey., MD;  Location: [facility] OR;  Service: Urology;  Laterality: Bilateral;   ENDOVEIN HARVEST OF GREATER SAPHENOUS VEIN Right  08/11/2016   Procedure: ENDOVEIN HARVEST OF GREATER SAPHENOUS VEIN;  Surgeon: Dallas KATHEE Jude, MD;  Location: [facility] OR;  Service: Open Heart Surgery;  Laterality: Right;   TEE WITHOUT CARDIOVERSION N/A 08/11/2016   Procedure: TRANSESOPHAGEAL ECHOCARDIOGRAM (TEE);  Surgeon: Dallas KATHEE Jude, MD;  Location: [facility];  Service: Open Heart Surgery;  Laterality: N/A;   TRANSURETHRAL RESECTION OF BLADDER TUMOR Bilateral 11/04/2023   Procedure: TRANSURETHRAL RESECTION OF BLADDER TUMOR (TURBT)/BILATERAL RETROGRADE;  Surgeon: Alvaro Ricardo KATHEE Mickey., MD;  Location: [facility];  Service: Urology;  Laterality: Bilateral;  60 MINUTES NEEDED FOR CASE   TRANSURETHRAL RESECTION OF BLADDER TUMOR N/A 01/13/2024   Procedure: TURBT (TRANSURETHRAL RESECTION OF BLADDER TUMOR);  Surgeon: Alvaro Ricardo KATHEE Mickey., MD;  Location: [facility] OR;  Service: Urology;  Laterality: N/A;    Allergies  Allergies  Allergen Reactions   Lidocaine  Anaphylaxis and Other (See Comments)    novacaine and lidocaine   Other Reaction(s): small doses ok for lidocaine  per pt   Meloxicam Other (See Comments)   Procaine Other (See Comments)   Quinolones     Patient was warned about not using Cipro and similar antibiotics. Recent studies have raised concern that fluoroquinolone antibiotics could be associated with an increased risk of aortic aneurysm Fluoroquinolones have non-antimicrobial properties that might jeopardise the integrity of the extracellular matrix of the vascular wall In a  propensity score matched cohort study in [place], there was a 66% increased rate of aortic aneurysm or dissection associated with oral fluoroquinolone use, compared wit   Statins Nausea And Vomiting    GI symptoms on almost all statins Other reaction(s): GI intolerance (Lipitor, Zocor)       History of Present Illness      83 y.o. y/o male with the above complex past medical history including CAD, hypertension, hyperlipidemia, persistent atrial fibrillation, ascending  aortic aneurysm, abdominal aortic aneurysm, and stage III chronic kidney disease. Cardiac history dates back to 2000, when he suffered an MI and underwent PCI and stent placement. Unfortunately, he suffered a non-STEMI in December 2017, and underwent diagnostic catheterization revealing severe mid LAD, OM 3, and RCA disease. He subsequently underwent CABG x3. Stress testing in March 2021, in the setting of chest pain, showed no ischemia or scar, and was deemed low risk.  He was found to be in rate-controlled, asymptomatic Afib in 02/2022, and decision was made to continue rate-control, while eliquis  was added, and later switched to warfarin due to cost.  Echo in 02/2022, showed an EF of 50 to 55% with moderately dilated left atrium, and mild MR.  He has been followed closely by CT surgery in the setting of ascending aortic aneurysm.  CT of the chest in March 2024 showed stable 4.5 cm ascending aortic aneurysm.  Mr. Askren developed hematuria in January 2025 and subsequently underwent CT of the abdomen which showed a large, lobulated enhancing endoluminal mass arising from the bladder dome measuring at least 5.1 x 3.6 x 1.3 cm, consistent with primary bladder malignancy without evidence of lymphadenopathy or metastatic disease.  3.7 x 3.5 cm infrarenal abdominal aortic aneurysm was also noted.  March 2025 CT of the chest showed stable 4.5 cm ascending thoracic aortic aneurysm.  In May 2025, he underwent transurethral resection of bladder tumor.   Since his bladder surgery in May, Mr. Chabot has been doing well. He completed 6 BCG therapy treatments r/t his bladder tumor.  He is very active, noting that he is out in his yard as many days a week as the sun is out.  He will spend hours out there at a time.  He does not experience chest pain or dyspnea and denies palpitations, PND, orthopnea, dizziness, syncope, edema, or early satiety.  No recurrence of hematuria.  He continues to be followed in our  anticoagulation clinic for warfarin therapy. Objective   Home Medications    Current Outpatient Medications  Medication Sig Dispense Refill   Cholecalciferol (VITAMIN D3) 1000 units CAPS Take 4-5 capsules by mouth daily.     ezetimibe  (ZETIA ) 10 MG tablet Take 1 tablet (10 mg total) by mouth daily. 30 tablet 1   fenofibrate  160 MG tablet Take 160 mg by mouth daily.     gabapentin  (NEURONTIN ) 600 MG tablet Take 600 mg by mouth 3 (three) times daily.     levothyroxine  (SYNTHROID ) 50 MCG tablet Take 50 mcg by mouth daily.     lisinopril  (ZESTRIL ) 5 MG tablet Take 1 tablet (5 mg total) by mouth daily. 90 tablet 3   Multiple Vitamins-Minerals (PRESERVISION AREDS 2) CAPS Take 1 capsule by mouth 2 (two) times daily. Per pt takes one in am and one at lunch     Multiple Vitamins-Minerals (ZINC PO) Take 2 tablets by mouth daily.     rosuvastatin  (CRESTOR ) 5 MG tablet Take 1 tablet by mouth once daily 90 tablet 3   sildenafil (REVATIO) 20 MG tablet Take 20 mg by mouth daily as needed (Erectile Dysfunction).     silodosin  (RAPAFLO ) 8 MG CAPS capsule Take 1 capsule (8 mg total) by mouth daily as needed. For weak urinary stream 30 capsule 11   traMADol  (ULTRAM ) 50 MG tablet Take 1 tablet (50 mg total) by mouth every 6 (six) hours as needed for moderate pain (pain score 4-6) or severe pain (pain score 7-10) (post-operatively). 10 tablet 0   warfarin (COUMADIN ) 2.5 MG tablet TAKE 1/2 TO 1 (ONE-HALF TO ONE) TABLET BY MOUTH ONCE DAILY AS DIRECTED BY  COUMADIN   CLINIC 30 tablet 0   No current facility-administered medications for this visit.     Physical Exam    VS:  BP 128/72 (BP Location: Left Arm, Patient Position: Sitting, Cuff Size: Normal)   Pulse (!) 58   Ht 5' 7 (1.702 m)   Wt 150 lb (68 kg)   SpO2 99%   BMI 23.49 kg/m  , BMI Body mass index is 23.49 kg/m.          GEN: Well nourished, well developed, in no acute distress. HEENT: normal. Neck: Supple, no JVD, carotid bruits, or  masses. Cardiac: Irregularly irregular, no murmurs, rubs, or gallops. No clubbing, cyanosis, edema.  Radials 2+/PT 2+ and equal bilaterally.  Respiratory:  Respirations regular and unlabored, clear to auscultation bilaterally. GI: Soft,  nontender, nondistended, BS + x 4. MS: no deformity or atrophy. Skin: warm and dry, no rash. Neuro:  Strength and sensation are intact. Psych: Normal affect.  Accessory Clinical Findings    ECG personally reviewed by me today - EKG Interpretation Date/Time:  Tuesday April 10 2024 10:28:00 EDT Ventricular Rate:  58 PR Interval:    QRS Duration:  88 QT Interval:  384 QTC Calculation: 376 R Axis:   36  Text Interpretation: Atrial fibrillation with slow ventricular response No acute changes Confirmed by Vivienne Bruckner [phone]) on 04/10/2024 10:38:22 AM  - no acute changes.  Lab Results  Component Value Date   WBC 7.4 10/26/2023   HGB 14.6 01/13/2024   HCT 43.0 01/13/2024   MCV 98.4 10/26/2023   PLT 173 10/26/2023   Lab Results  Component Value Date   CREATININE 1.10 01/13/2024   BUN 23 01/13/2024   NA 139 01/13/2024   K 4.0 01/13/2024   CL 105 01/13/2024   CO2 25 10/26/2023   Lab Results  Component Value Date   ALT 12 03/18/2022   AST 17 03/18/2022   ALKPHOS 29 (L) 03/18/2022   BILITOT 0.7 03/18/2022   Lab Results  Component Value Date   CHOL 118 03/18/2022   HDL 31 (L) 03/18/2022   LDLCALC 60 03/18/2022   LDLDIRECT 70 03/18/2022   TRIG 133 03/18/2022   CHOLHDL 3.8 03/18/2022    Lab Results  Component Value Date   HGBA1C 6.0 (H) 08/10/2016   Lab Results  Component Value Date   TSH 2.260 03/18/2022       Assessment & Plan    1.  Coronary artery disease: Status post CABG in 2017 with low risk Myoview  2021.  Recently went through TURBT in May 2025 and tolerated the perioperative period well.  He is now active again in and about his yard as many days of the week as weather allows.  He does not experience chest pain or  dyspnea.  He remains on ACE inhibitor, statin, and Zetia  therapy.  He is not on aspirin  in the setting of chronic warfarin.  2.  Permanent atrial fibrillation: Diagnosed in July 2023.  CHA2DS2-VASc equals 4.  Asymptomatic and well rate controlled in the absence of AV nodal blocking agents.  He is on warfarin and his INRs followed in our anticoagulation clinic.  3.  Primary hypertension: Blood pressure stable at 128/72 on ACE inhibitor therapy.  4.  Hyperlipidemia: Lipids followed by primary care.  He remains on statin and Zetia  therapy.  5.  Stage III chronic kidney disease: Followed by primary care.  On lisinopril .  6.  Abdominal aortic aneurysm: 3.7 x 3.5 cm infrarenal AAA on CT in January 2025.    7.  Ascending thoracic aortic aneurysm: Followed by CT surgery with chest CT in March 2025 showing stable 4.5 cm ascending thoracic aortic aneurysm.  Blood pressure stable at 128/72.  8.  Bladder tumor: Status post TURBT in May followed by BCG therapy.  Doing well without hematuria.  Has outpatient urology follow-up planned.  9.  Disposition: Follow-up in cardiology clinic in 6 months or sooner if necessary.  Bruckner Vivienne, NP 04/10/2024, 11:15 AM

## 2024-04-10 NOTE — Patient Instructions (Signed)
 Medication Instructions:  The current medical regimen is effective;  continue present plan and medications as directed. Please refer to the Current Medication list given to you today.   *If you need a refill on your cardiac medications before your next appointment, please call your pharmacy*  Follow-Up: At [facility], you and your health needs are our priority.  As part of our continuing mission to provide you with exceptional heart care, our providers are all part of one team.  This team includes your primary Cardiologist (physician) and Advanced Practice Providers or APPs (Physician Assistants and Nurse Practitioners) who all work together to provide you with the care you need, when you need it.  Your next appointment:   6 month(s)  Provider:   Deatrice Cage, MD    We recommend signing up for the patient portal called MyChart.  Sign up information is provided on this After Visit Summary.  MyChart is used to connect with patients for Virtual Visits (Telemedicine).  Patients are able to view lab/test results, encounter notes, upcoming appointments, etc.  Non-urgent messages can be sent to your provider as well.   To learn more about what you can do with MyChart, go to [email or web address].

## 2024-04-11 ENCOUNTER — Ambulatory Visit: Attending: Cardiovascular Disease

## 2024-04-11 DIAGNOSIS — I4819 Other persistent atrial fibrillation: Secondary | ICD-10-CM | POA: Insufficient documentation

## 2024-04-11 DIAGNOSIS — Z5181 Encounter for therapeutic drug level monitoring: Secondary | ICD-10-CM | POA: Insufficient documentation

## 2024-04-11 LAB — POCT INR: INR: 2.8 (ref 2.0–3.0)

## 2024-04-11 NOTE — Patient Instructions (Signed)
 Continue 0.5 tablet Daily except 1 Sunday and Thursday.  INR in 6 weeks.  [phone]

## 2024-04-23 DIAGNOSIS — R410 Disorientation, unspecified: Secondary | ICD-10-CM | POA: Diagnosis not present

## 2024-05-23 ENCOUNTER — Ambulatory Visit: Attending: Cardiovascular Disease

## 2024-05-23 DIAGNOSIS — I4819 Other persistent atrial fibrillation: Secondary | ICD-10-CM | POA: Insufficient documentation

## 2024-05-23 DIAGNOSIS — Z5181 Encounter for therapeutic drug level monitoring: Secondary | ICD-10-CM | POA: Insufficient documentation

## 2024-05-23 LAB — POCT INR: INR: 2.2 (ref 2.0–3.0)

## 2024-05-23 NOTE — Patient Instructions (Signed)
 Continue 0.5 tablet Daily except 1 Sunday and Thursday.  INR in 6 weeks.  [phone]

## 2024-05-28 ENCOUNTER — Other Ambulatory Visit: Payer: Self-pay | Admitting: Cardiovascular Disease

## 2024-05-28 DIAGNOSIS — I4821 Permanent atrial fibrillation: Secondary | ICD-10-CM

## 2024-06-04 DIAGNOSIS — H353212 Exudative age-related macular degeneration, right eye, with inactive choroidal neovascularization: Secondary | ICD-10-CM | POA: Diagnosis not present

## 2024-06-12 DIAGNOSIS — C678 Malignant neoplasm of overlapping sites of bladder: Secondary | ICD-10-CM | POA: Diagnosis not present

## 2024-07-04 ENCOUNTER — Ambulatory Visit: Attending: Cardiovascular Disease

## 2024-07-04 DIAGNOSIS — Z5181 Encounter for therapeutic drug level monitoring: Secondary | ICD-10-CM | POA: Diagnosis not present

## 2024-07-04 DIAGNOSIS — I4819 Other persistent atrial fibrillation: Secondary | ICD-10-CM | POA: Diagnosis not present

## 2024-07-04 LAB — POCT INR: INR: 3.2 — AB (ref 2.0–3.0)

## 2024-07-04 NOTE — Patient Instructions (Signed)
 Hold today only then Continue 0.5 tablet Daily except 1 Sunday and Thursday.  INR in 6 weeks.  [phone]

## 2024-07-09 ENCOUNTER — Other Ambulatory Visit: Payer: Self-pay | Admitting: Cardiovascular Disease

## 2024-07-09 DIAGNOSIS — I4821 Permanent atrial fibrillation: Secondary | ICD-10-CM

## 2024-08-01 DIAGNOSIS — D485 Neoplasm of uncertain behavior of skin: Secondary | ICD-10-CM | POA: Diagnosis not present

## 2024-08-01 DIAGNOSIS — L821 Other seborrheic keratosis: Secondary | ICD-10-CM | POA: Diagnosis not present

## 2024-08-01 DIAGNOSIS — L578 Other skin changes due to chronic exposure to nonionizing radiation: Secondary | ICD-10-CM | POA: Diagnosis not present

## 2024-08-01 DIAGNOSIS — D225 Melanocytic nevi of trunk: Secondary | ICD-10-CM | POA: Diagnosis not present

## 2024-08-01 DIAGNOSIS — Z85828 Personal history of other malignant neoplasm of skin: Secondary | ICD-10-CM | POA: Diagnosis not present

## 2024-08-01 DIAGNOSIS — L570 Actinic keratosis: Secondary | ICD-10-CM | POA: Diagnosis not present

## 2024-08-01 DIAGNOSIS — L814 Other melanin hyperpigmentation: Secondary | ICD-10-CM | POA: Diagnosis not present

## 2024-08-15 ENCOUNTER — Ambulatory Visit: Attending: Cardiovascular Disease

## 2024-08-15 DIAGNOSIS — Z5181 Encounter for therapeutic drug level monitoring: Secondary | ICD-10-CM | POA: Diagnosis present

## 2024-08-15 DIAGNOSIS — I4819 Other persistent atrial fibrillation: Secondary | ICD-10-CM | POA: Insufficient documentation

## 2024-08-15 LAB — POCT INR: INR: 4.1 — AB (ref 2.0–3.0)

## 2024-08-15 NOTE — Patient Instructions (Signed)
 Hold today only and take 0.5 tablet tomorrow  then Continue 0.5 tablet Daily except 1 tablet every Sunday and Thursday.  INR in 4 weeks.  [phone]

## 2024-08-21 NOTE — Progress Notes (Signed)
"   INR 4.1 Please see anticoagulation encounter Hold today only and take 0.5 tablet tomorrow  then Continue 0.5 tablet Daily except 1 tablet every Sunday and Thursday.  INR in 4 weeks.  [phone] "

## 2024-09-12 ENCOUNTER — Ambulatory Visit

## 2024-09-12 DIAGNOSIS — Z5181 Encounter for therapeutic drug level monitoring: Secondary | ICD-10-CM | POA: Diagnosis present

## 2024-09-12 DIAGNOSIS — I4819 Other persistent atrial fibrillation: Secondary | ICD-10-CM | POA: Insufficient documentation

## 2024-09-12 LAB — POCT INR: INR: 3.6 — AB (ref 2.0–3.0)

## 2024-09-12 NOTE — Patient Instructions (Signed)
 Hold today only then Decrease to 0.5 tablet Daily except 1 tablet every  Thursday.  INR in 4 weeks.  [phone]

## 2024-10-10 ENCOUNTER — Ambulatory Visit
